# Patient Record
Sex: Female | Born: 1990 | Race: White | Hispanic: No | Marital: Single | Smoking: Never smoker
Health system: Southern US, Community
[De-identification: ages and names within clinical notes are randomized; demographics above are authoritative.]

## PROBLEM LIST (undated history)

## (undated) DIAGNOSIS — F329 Major depressive disorder, single episode, unspecified: Secondary | ICD-10-CM

## (undated) DIAGNOSIS — F32A Depression, unspecified: Secondary | ICD-10-CM

## (undated) DIAGNOSIS — N83209 Unspecified ovarian cyst, unspecified side: Secondary | ICD-10-CM

## (undated) DIAGNOSIS — G90A Postural orthostatic tachycardia syndrome (POTS): Secondary | ICD-10-CM

## (undated) DIAGNOSIS — F419 Anxiety disorder, unspecified: Secondary | ICD-10-CM

## (undated) DIAGNOSIS — I498 Other specified cardiac arrhythmias: Secondary | ICD-10-CM

## (undated) DIAGNOSIS — J45909 Unspecified asthma, uncomplicated: Secondary | ICD-10-CM

## (undated) DIAGNOSIS — R7401 Elevation of levels of liver transaminase levels: Secondary | ICD-10-CM

## (undated) DIAGNOSIS — G43909 Migraine, unspecified, not intractable, without status migrainosus: Secondary | ICD-10-CM

## (undated) DIAGNOSIS — E785 Hyperlipidemia, unspecified: Secondary | ICD-10-CM

## (undated) DIAGNOSIS — Q796 Ehlers-Danlos syndrome, unspecified: Secondary | ICD-10-CM

## (undated) DIAGNOSIS — E538 Deficiency of other specified B group vitamins: Secondary | ICD-10-CM

## (undated) DIAGNOSIS — O1410 Severe pre-eclampsia, unspecified trimester: Secondary | ICD-10-CM

## (undated) DIAGNOSIS — E559 Vitamin D deficiency, unspecified: Secondary | ICD-10-CM

## (undated) HISTORY — DX: Elevation of levels of liver transaminase levels: R74.01

## (undated) HISTORY — PX: WISDOM TOOTH EXTRACTION: SHX21

## (undated) HISTORY — DX: Vitamin D deficiency, unspecified: E55.9

## (undated) HISTORY — PX: EYE SURGERY: SHX253

## (undated) HISTORY — DX: Anxiety disorder, unspecified: F41.9

## (undated) HISTORY — DX: Migraine, unspecified, not intractable, without status migrainosus: G43.909

## (undated) HISTORY — DX: Ehlers-Danlos syndrome, unspecified: Q79.60

## (undated) HISTORY — DX: Depression, unspecified: F32.A

## (undated) HISTORY — DX: Deficiency of other specified B group vitamins: E53.8

## (undated) HISTORY — DX: Unspecified asthma, uncomplicated: J45.909

## (undated) HISTORY — DX: Hyperlipidemia, unspecified: E78.5

---
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
## 2010-08-19 NOTE — Assessment & Plan Note (Signed)
[place] OFFICE NOTE   NAME:[name], [name]                      MRN:          [id number]  DATE:06/30/2007                            DOB:          [birth year]    The patient is seen in follow-up for symptoms that were thought to be  dysautonomic.  She also has a history of hypertension, notable  hypertriglyceridemia, and headaches over the last year.  Typically, she  has short stature and appearance of long arms.   I discussed her situation with Dr. Freddy Jaksch at [facility]  forgetting about the hypertension, however though.  We discussed whether  endocrinology or genetics would be the right place to start and she  thought genetics would be.  To that end we have given the patient the  number for [facility] and have asked them to make that contact.  If  there is any difficulty she is to call us back and we will certainly go  ahead to facilitate this.  It is interesting that Dr. Marvell Fuller  is at [facility]; I remember him from when I was a resident there in the mid-  80s.   I should also note that she is on propranolol 10 mg t.i.d.  While this  is associated with some fatigue, she has been able to go to school full  time for the last couple of days for the first time in the last month or  so.   She is to let us know how she is doing and we will plan to see her in  four months' time anyway.     Deboraha Sprang, MD, [facility]  Electronically Signed    SCK/MedQ  DD: 06/30/2007  DT: 06/30/2007  Job #: [id number]   cc:   Alessandra Grout, M.D.

## 2010-08-19 NOTE — Discharge Summary (Signed)
[name], [name] [name]             ACCOUNT NO.:  [id number]   MEDICAL RECORD NO.:  [id number]          PATIENT TYPE:  OIB   LOCATION:  2853                         FACILITY:  [facility]   PHYSICIAN:  Duke Salvia, MD, FACCDATE OF BIRTH:  May 19, 1990   DATE OF ADMISSION:  06/02/2007  DATE OF DISCHARGE:  06/02/2007                               DISCHARGE SUMMARY   FINAL DIAGNOSES:  1. Fatigue, syncope, palpitations, orthostatic intolerance, dizziness.  2. Postural increase in heart rate at stress test May 10, 2007.  3. No exercise induced hypotension at stress test May 10, 2007.  4. Echocardiogram, ejection fraction  60%, no left ventricular wall      motion abnormalities.  5. Positive tilt study June 02, 2007 with postural tachycardia and      mixed cardioinhibition/vasodepression.   BRIEF HISTORY:  [name] [name] is an 20 year old female.  She has been  having dizziness which began at the end of January 2009.  She had a  dizzy spell in school.  She became diaphoretic, nauseated and then lost  consciousness as cold rags were placed on her head in the bathroom.  There was some question as to her blood pressure at the time of that  event. However, there was residual orthostatic intolerance in that when  they tried to move her from the bathroom to the principal's office, she  became extremely lightheaded and presyncopal once again.   Second episode occurred when she became syncopal working on an  elliptical machine at home.  She has had problems with recurrent tachy  palpitations when walking and going up stairs.  She has had significant  fatigue over the last couple months which has gotten worse.  Her diet is  fairly fluid compensated, but deplete of salt.  Prior workup has shown  normal thyroid, liver function studies pancreas and kidney tests.   ALLERGIES:  NO KNOWN DRUG ALLERGIES   What with the recurrent spells of tachycardia, lightheaded and dizziness  with an  epiphenomenon which suggests a neurally mediated episode,  recommendation is for a tilt study.  This will be done at the earliest  convenient time   HOSPITAL COURSE:  The patient presents electively June 02, 2007.  Her tilt study was positive with postural tachycardia, mixed  cardioinhibition, vasodepression syndrome, elements of postural  orthostatic tachycardia and orthostatic intolerance.  I am sure that the  patient is encouraged to increase salt intake.  She is discouraged from  competing in [place] this term.  She will be started on beta blocker  therapy at this time.   DISCHARGE MEDICATIONS:  1. Lovaza which is 1 gram of fish oil and 4 mg of vitamin E daily.  2. Meclizine as needed for dizziness.  3. Zomig as needed for migraines.   Currently, the patient has a very elevated triglyceride value with  triglycerides of 806, thus starting the fish oil.  She had a history of  migraines I guess as well.   New medications now are for __________ .  She is to take the first one  or unless it is ineffective and she can  cycle to the second one and then  the third one.  The first one is atenolol 50 mg daily.  The second one  is Inderal 60 mg daily.  The third one is Nadolol 20 mg daily.   FOLLOW UP:  The patient has follow up with Dr. Graciela Husbands on Thursday, June 30, 2007 at 3:30 p.m.      [name] [name], [name] [name]      Duke Salvia, MD, [facility]  Electronically Signed    GM/MEDQ  D:  06/02/2007  T:  06/03/2007  Job:  [id number]   cc:   Noralyn Pick. Eden Emms, MD, [facility]  Duke Salvia, MD, [facility]

## 2010-08-19 NOTE — Letter (Signed)
Aug 25, 2007    [name] Coma, MD  [place] [address]   RE:  [name], [name]  MRN:  [id number]  /  DOB:  [birth year]   Dear [name]:   I still look forward to putting a face to the name as you remain  singular in your having identified dysautonomia.   Just a brief followup on [facility].  [name] has been seen by the  endocrine people at [facility] trying to help understand the interactions  between her short stature, her hypertriglyceridemia.  Return evaluations  are pending part of the above.   She also saw the geneticist down at [facility] who thought she had some kind  of chondroplastic syndrome, although the issue of Turner syndrome has  been raised previously.  She will plan to follow up with the team at  [facility] to try to keep a consolidated approach.   I have told her that if as she ventriculates to the [facility] that we  will try to arrange electrophysiological followup for her postural  orthostatic tachycardia.   I should note that she has had no dizzy spells in about 2 months on the  beta blocker, salt repletion and fluid intake.  I am thrilled for her in  this regard.   We had a lengthy discussion regarding exercise and she will begin  exercising again in the A.) air conditioned environment, B.) maintaining  fluid repletion, C.) maintaining salt repletion and I  have given the name of a couple of pharmacies that may be able to  provide salt tablets as she does not like to eat them.   Thank you very much for allowing [place] to participate in her care.    Sincerely,      Deboraha Sprang, MD, [facility]  Electronically Signed    SCK/MedQ  DD: 08/25/2007  DT: 08/25/2007  Job #: [id number]

## 2010-08-19 NOTE — Procedures (Signed)
[facility]                              EXERCISE TREADMILL   NAME:[name], [name]                      MRN:          [id number]  DATE:05/10/2007                            DOB:          [birth year]    INDICATIONS:  Presyncope in a young adult.   The patient exercised for 8 minutes on a modified Bruce protocol.  First  two stages were advanced after 1 minute.  Heart rate increased to a  maximum of 178.  Blood pressure was stable at 158/70.  There was no  exercise-induced hypotension.  The patient did have an increase in her  heart rate from 95 to about 128 with initial standing.  With stress  there is no chest pain, presyncope, diaphoresis, or exercise-induced  arrhythmias.   IMPRESSION:  Negative exercise stress test at a reasonable work load  with no exercise-induced arrhythmias or hypotension.  Postural increase  in heart rate only.     Wallis Bamberg. Johnsie Cancel, MD, [facility]  Electronically Signed    PCN/MedQ  DD: 05/10/2007  DT: 05/10/2007  Job #: [id number]

## 2010-08-19 NOTE — Op Note (Signed)
[name] [name], [name] [name]             ACCOUNT NO.:  [id number]   MEDICAL RECORD NO.:  [id number]          PATIENT TYPE:  OIB   LOCATION:  2853                         FACILITY:  [facility]   PHYSICIAN:  Deboraha Sprang, MD, FACCDATE OF BIRTH:  1990-05-14   DATE OF PROCEDURE:  06/02/2007  DATE OF DISCHARGE:  06/02/2007                               OPERATIVE REPORT   PREOPERATIVE DIAGNOSIS:  Recurrent syncope.   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   The patient was submitted to a standard tilt table test.  After  equilibration in a supine position, she was tilted upright for 15  minutes.  There was a modest change in her heart rate from the mid 80s  to the mid 110s with orthostatic stress.   The patient was then given nitroglycerin 0.4 mg sublingual.  About 3  minutes after this, the patient became syncopal with heart rates  plummeting into the 50s.  The patient was returned to the supine  position with gradual regain of consciousness.  The symptoms associated  with the syncopal episode were typical of her prior syncope.   IMPRESSION:  1. Possible orthostatic tachycardia.  2. Vasodepressor and cardioinhibitory (mixed) neurally mediated      syncope.   RECOMMENDATIONS:  We will proceed with beta-blocker therapy.  I will  give her trial drugs of atenolol 25-50, nadolol 10-20, and Inderal LA 60  to see which she may tolerate.      Deboraha Sprang, MD, [facility]  Electronically Signed     SCK/MEDQ  D:  06/02/2007  T:  06/03/2007  Job:  [id number]   cc:   Lilian Coma, MD  Electrophysiology Laboratory

## 2010-08-19 NOTE — Letter (Signed)
May 30, 2007    Noralyn Pick. Eden Emms, MD, [facility]  [address]  [address]  [place],  [place]. [address]   RE:  QUINNIE, SALVAGE  MRN:  [id number]  /  DOB:  10-31-1990   Dear Cindee Lame:   It was a pleasure to see [name] [name] today at your request.  As you  know, she is a 20 year old girl who is having problems with syncope,  palpitations and some orthostatic intolerance and dizziness.   They date this back to January 28 when she had her first episode of  this. She started having problems with dizziness and she was walking  between her third and fourth period and because of ongoing dizziness she  called her mom who came to see her. When she saw her, she was noted to  be quite pale and clammy.  She was nauseated. They went to the bathroom  to put cold rags on and she lost consciousness in the bathroom. Just  prior to that the nurse had come, who been summoned by a friend.  They  laid her on the ground and they lifted up her legs and she took her  blood pressure and described as as being high according to the mom.  Indeed, later she said if he did not come down they would have to call  EMS suggesting that there was a discrepancy here between what I would  anticipate as hypotension and measured hypertension. There was residual  orthostatic intolerance in that when they tried to move her from the  bathroom to the principals office, she became extremely lightheaded and  pre-syncopal again.   She has had another episode that where she became syncopal following  getting dizzy on the elliptical at home.  She has had problems with  recurrent tachy palpitations noted with walking and going up stairs.  She has also has significant fatigue over the last couple of months that  has worsened in the last month or so.   Her diet is fairly replete of fluid, but is quite deplete of salt.   Her blood work has shown normal thyroid, liver, pancreas and kidney test  according to mom.  Interestingly, her  blood was quite lipemic and her  triglycerides were 806 and she was started on fish oil.   As you noted, her stature is short.  She is 4 feet, 8 inches tall. This  is anomalous for her family.  This has not been worked up here to date.   The patient and her mom denies significant intercurrent psychosocial  stress.   Her medications include the Lovaza, meclizine.   She has no known drug allergies.   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   On examination her blood pressure is mildly elevated at baseline today  and was 120/88.  Her orthostatics demonstrated change from sitting to  standing of 73 to 91; her diastolic pressures were in the 90s  throughout.   The stress test that she had done with Dr. Eden Emms earlier this week had  a baseline blood pressure of 91 to 96 systolic.  Her HEENT exam demonstrated no icterus or xanthoma. The neck veins were  flat.  The carotids are brisk and full bilaterally out bruits.  BACK:  Without kyphosis, scoliosis.  Lungs were clear.  HEART:  Sounds were regular without murmurs or gallops.  The abdomen is soft with active bowel sounds without midline pulsation  or hepatomegaly.  Femoral pulses were 2+; distal pulses were intact.  There is no  clubbing, cyanosis or edema.  Neurological exam was grossly normal.  The skin was warm and dry.   Her electrocardiogram was also normal with normal intervals.   Inspiratory/expiratory ratios demonstrated a quite robust vagal  innervation.   IMPRESSION:  1. Recurrent spells of tachycardia, lightheadedness and dizziness with      epiphenomenon suggestive of neurally mediated episode, but with a      recorded blood pressure during these episodes that was apparently      high and spells occurring with exertion.  2. Hypertriglyceridemia.  3. Diastolic hypertension.  4.  Short stature.   DISCUSSION:  Shryl, Fehrmann has a series of findings and I do not know  how to pull them all together.  I will take the liberty of talking to a  pediatric endocrinologist that I know down at [facility] to ask the question  about triglyceridemia and short stature and I agree it may well be that  she should be seen by a specialist as she has body proportions to me  that look a little bit unusual.  How this may relate to these spells I  am not sure it is reassuring that she had dizziness on the treadmill  that was associated with no arrhythmia, but notwithstanding given the  fact that she is having dizziness with exertion and pre-syncope and  syncope in this setting I think it is not safe for her to participate in  sports currently.   I have advised her of this and we will work as quickly as we can to try  make some headway.  I have discussed also the potential benefit of tilt  table testing and we will see how it is that that works.    Sincerely,      Duke Salvia, MD, [facility]  Electronically Signed    SCK/MedQ  DD: 05/30/2007  DT: 05/30/2007  Job #: [id number]   CC:    Emeterio Reeve, MD

## 2010-08-19 NOTE — Assessment & Plan Note (Signed)
[facility]                            CARDIOLOGY OFFICE NOTE   NAME:[name], [name]                      MRN:          [id number]  DATE:05/10/2007                            DOB:          April 30, 1990    [name] [name] is an 20 year old patient of Dr. Sandria Senter from [place]. She is referred for pre-syncope.   I actually got a call from [name] father,  [name] [name], whom I work with  doing cardiac CT.  He was concerned about his daughter. The the patient  was at school last Thursday,  she felt somewhat weak and ill. She went  to the nurse's station. Apparently, her blood pressure was high.  She  subsequently passed out.  There was no actual loss of consciousness as  far as I can tell; there was no seizure activity.  The patient has just  felt very fatigued.  She has had a bit of a busy schedule. 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   She, on occasion, will have pain in her chest.  This tends to recur with  activity. It sounds more pleuritic in nature.  There is a pressure  sensation when she takes a deep breath.   The patient has had two other bouts of syncope.  One was about 6 years  ago in the setting of significant nosebleeds and one was 2-3 years ago  [place]   She has not had any significant palpitations or history of a heart  problems.  [name] [name] has always been very short of stature. I had talked  to her mother about this and asked if during her pediatric days she had  had any workup for this she said that [name] [name] has not had any  significant endocrine nor familial problems.  She said that her  daughter's biological farther was very short in stature.   Apparently, [name] [name] saw Dr. Laurine Blazer a while back for some blood work and  has extremely high triglyceride levels.  She was tried on a Omega fatty  acids.  She apparently had repeat blood work  Friday and will have to get  these. I do not know what blood work was done, but I assume that  possibly EB virus, CBC,  thyroid were checked.   [name] [name] currently feels tired. She has not had any recurrent episodes  since Thursday.   REVIEW OF SYSTEMS:  Otherwise negative. In particular, she has not had  seizure activity.  No CNS events; no palpitations.  No previous cardiac  problems.  She was told by the nurse at school that she thought her  heart was weak for some reason, I have no idea or how a nurse would make  that assessment without any equipment, EKG or echo. Her past medical  history is remarkable for strabismus. She has had previous eye surgery  at the age of [age].  She otherwise is negative.   She denies any allergies.   She does not take any medicines  routinely.   FAMILY HISTORY:  Is negative for premature coronary disease, sudden  death and abnormal cardiomyopathies or abnormal QT syndromes.   The patient is a [name] [name] representative at [name] [name].  She is active.  She plays  Lacrosse.  Her senior project involves contemporary dance.  She does not  smoke or drink.  Her mother says she is a good child and really is not  into drugs, alcohol and not having any other problems.   The family history is noncontributory.   PHYSICAL EXAMINATION:  Is remarkable for a healthy-appearing, young  white female short of stature at 4 feet, 8 inches. Blood pressure is  126/79 and actually increases to 136/82 while standing. Interestingly,  during her stress test, her heart rate went from 95 to 130 with  standing.  HEENT:  Unremarkable.  Carotids are normal without bruit, no  lymphadenopathy, thyromegaly, JVP elevation.  LUNGS:  Clear with good  diaphragmatic motion.  No wheezing.  S1-S2 normal heart sounds.  PMI normal.  ABDOMEN:  Benign.  Bowel sounds positive.  No hepatosplenomegaly. No  hepatojugular reflux.  Distal pulses are intact, no edema.  NEURO:  Nonfocal.  SKIN:  Warm and  dry.   Baseline EKG is normal with normal PR and QT intervals.   The the patient had a 2-D echocardiogram today.  I was present for it.  It is totally normal.  She has good RV and LV function.  There is no RV  diverticula, no regional wall motion abnormalities.  No congenital  valvular heart disease and no pericardial effusion.  I performed a  treadmill test on the patient. She went 8 minutes on and advanced age  Bruce protocol.  Heart rate increased to a max of 182, blood pressure  rose normally to a max of 158/88. As indicated, she initially had about  a 30 beat elevation in heart rate from sitting to standing there was no  hypotension in the postexercise period. There was no exercise-induced  arrhythmias.   IMPRESSION:  I do not think that [name] [name] has a significant heart  problem.  She is probably run down somewhat. I would be curious as to  what her lab work showed in regards to making sure she is not anemic or  hypothyroid. I will leave it up to Dr. Laurine Blazer. However, I do think that  further endocrine workup may be in order in regards to her short  stature. I do not know if she has ever had any bone films to make sure  that there is no form of dwarfism or other congenital abnormalities in  regards to this.  1. Postural tachycardia.  She really does not have a Potts syndrome in      regards to orthostasis because her blood pressure does not fall      low.  Will have to see what her baseline lab work looks like, but      we may monitor this further as her heart rate does seem a bit high      for someone who is generally healthy.  I may have her sees Dr.      Berton Mount for further workup of this and make sure it does not      represent any other autonomic dysfunction.  2. Hypertriglyceridemia.  We will try to get lab work from Dr.      Laurine Blazer. It would be fairly unusual for this at this age to have      such  lipemic blood. Certainly omega III fatty acids would be in      order,  possibly switching to Lovaza, but I would like to see what      her general lipid profile is.   In general, with a normal EKG, normal echo and essentially normal  treadmill test, I do not think that the patient is at risk for  significant cardiac syncopal episode.   We will review her lab work and have her follow up with her general care  doctor and again I suspect I will have her see Dr. Graciela Husbands in regards to  possible autonomic dysfunction or postural tachycardia syndrome.     Noralyn Pick. Eden Emms, MD, [facility]  Electronically Signed    PCN/MedQ  DD: 05/10/2007  DT: 05/10/2007  Job #: [id number]

## 2010-12-26 LAB — HCG, SERUM, QUALITATIVE: Preg, Serum: NEGATIVE

## 2011-01-12 ENCOUNTER — Encounter: Payer: Self-pay | Admitting: Internal Medicine

## 2018-03-23 ENCOUNTER — Other Ambulatory Visit: Payer: Self-pay | Admitting: Family Medicine

## 2018-03-23 DIAGNOSIS — R7401 Elevation of levels of liver transaminase levels: Secondary | ICD-10-CM

## 2018-03-23 DIAGNOSIS — R740 Nonspecific elevation of levels of transaminase and lactic acid dehydrogenase [LDH]: Principal | ICD-10-CM

## 2018-03-24 ENCOUNTER — Ambulatory Visit
Admission: RE | Admit: 2018-03-24 | Discharge: 2018-03-24 | Disposition: A | Discharge status: Home / Self Care | Payer: Medicaid Other | Source: Ambulatory Visit | Attending: Family Medicine | Admitting: Family Medicine

## 2018-03-24 DIAGNOSIS — R740 Nonspecific elevation of levels of transaminase and lactic acid dehydrogenase [LDH]: Principal | ICD-10-CM

## 2018-03-24 DIAGNOSIS — R7401 Elevation of levels of liver transaminase levels: Secondary | ICD-10-CM

## 2018-06-20 ENCOUNTER — Encounter: Payer: Self-pay | Admitting: Endocrinology

## 2018-09-27 ENCOUNTER — Encounter: Payer: Self-pay | Admitting: Internal Medicine

## 2018-09-27 ENCOUNTER — Ambulatory Visit: Payer: Medicaid Other | Admitting: Internal Medicine

## 2018-09-27 ENCOUNTER — Other Ambulatory Visit: Payer: Self-pay

## 2018-09-27 VITALS — BP 124/78 | HR 91 | Ht <= 58 in | Wt 160.4 lb

## 2018-09-27 DIAGNOSIS — E782 Mixed hyperlipidemia: Secondary | ICD-10-CM

## 2018-09-27 DIAGNOSIS — K7581 Nonalcoholic steatohepatitis (NASH): Secondary | ICD-10-CM | POA: Diagnosis not present

## 2018-09-27 NOTE — Patient Instructions (Signed)
-   Continue Lovaza 4 grams daily  - Continue Crestor 10 mg daily   - Please discuss with your Gyn about switching your contraception , to take a trial off for 3 months

## 2018-09-27 NOTE — Progress Notes (Signed)
Name: [name] [name]  MRN/ DOB: [id number], [birth year]    Age/ Sex: 28 y.o., female    PCP: Mila Palmer, MD   Reason for Endocrinology Evaluation: Combined hyperlipidemia      Date of Initial Endocrinology Evaluation: 09/27/2018     HPI: [name] is a 28 y.o. female with a past medical history of Combined dyslipidemia, Deletion of SHOX (short stature), joint hypermobility  And NASH (sono 03/2018). The patient presented for initial endocrinology clinic visit on 09/27/2018 for consultative assistance with her combined hyperlipidemia    She was diagnosed with elevated TG at age 19 , which was in 800's range, of note the patient was on OCP's at the time for years. She was started on Lovaza at the time 4 grams daily  Which improved her TG levels to 200's. LDL increased was noted in 2016. She has tried Lipitor but made her nauseous and weak, she was then switched to another statin but currently on rosuvastatin, which she has been on for the past month.    No FH of dyslipidemia on mother side, unknown paternal history.  No FH of early death, or stoke on maternal side.    When she was younger she would drink on the weekend, now she would drink 3-5x a month.   No hx of pancreatistis  She is  Not on a strict low fat diet She is engaged and planning on getting married April/2021   HISTORY:  Past Medical History:  Past Medical History:  Diagnosis Date  . Depression   . EDS (Ehlers-Danlos syndrome)   . Hyperlipidemia   . Migraine   . Transaminitis   . Vitamin B12 deficiency   . Vitamin D deficiency    Past Surgical History:  Past Surgical History:  Procedure Laterality Date  . EYE SURGERY     x2  . WISDOM TOOTH EXTRACTION        Social History:  reports that she has never smoked. She has never used smokeless tobacco. She reports current alcohol use. She reports previous drug use.  Family History: family history includes Anxiety disorder in her mother;  Hypertension in her mother; Osteoarthritis in her paternal grandmother; Other in her father.   HOME MEDICATIONS: Allergies as of 09/27/2018   No Known Allergies     Medication List       Accurate as of September 27, 2018 11:52 AM. If you have any questions, ask your nurse or doctor.        Aimovig 70 MG/ML Soaj Generic drug: Erenumab-aooe Inject into the skin every 30 (thirty) days.   ALPRAZolam 0.5 MG tablet Commonly known as: XANAX Take 0.5 mg by mouth 3 (three) times daily as needed.   Azelastine-Fluticasone 137-50 MCG/ACT Susp Place 1 spray into the nose 2 (two) times a day.   baclofen 10 MG tablet Commonly known as: LIORESAL Take 20 mg by mouth 3 (three) times daily as needed.   Botox 200 units Solr Generic drug: Botulinum Toxin Type A Inject as directed every 3 (three) months.   Cyanocobalamin 1000 MCG/ML Kit Inject 1 mL as directed every 30 (thirty) days.   drospirenone-ethinyl estradiol 3-0.02 MG tablet Commonly known as: YAZ Take 1 tablet by mouth daily.   fluticasone 50 MCG/ACT nasal spray Commonly known as: FLONASE Place 1 spray into both nostrils daily.   hydrOXYzine 50 MG tablet Commonly known as: ATARAX/VISTARIL Take 50 mg by mouth every 8 (eight) hours as needed.   ketorolac 60 MG/2ML  Soln injection Commonly known as: TORADOL Inject 60 mg into the muscle once as needed.   meloxicam 7.5 MG tablet Commonly known as: MOBIC Take 7.5 mg by mouth daily as needed.   methylphenidate 5 MG tablet Commonly known as: RITALIN Take 5 mg by mouth daily as needed.   metroNIDAZOLE 0.75 % cream Commonly known as: METROCREAM Apply 1 application topically 2 (two) times daily.   mometasone 0.1 % cream Commonly known as: ELOCON Apply 1 application topically daily as needed.   omega-3 acid ethyl esters 1 g capsule Commonly known as: LOVAZA Take 1 g by mouth daily. Take 4 capsules daily   omega-3 acid ethyl esters 1 g capsule Commonly known as: LOVAZA Take  2 g by mouth 2 (two) times a day.   propranolol 20 MG tablet Commonly known as: INDERAL 20 mg 4 (four) times daily.   pyridostigmine 60 MG tablet Commonly known as: MESTINON Take 60 mg by mouth 3 (three) times daily.   rosuvastatin 10 MG tablet Commonly known as: CRESTOR Take 10 mg by mouth daily.   sertraline 25 MG tablet Commonly known as: ZOLOFT Take 50 mg by mouth daily.   SUMAtriptan 25 MG tablet Commonly known as: IMITREX Take 100 mg by mouth daily as needed.   zolpidem 5 MG tablet Commonly known as: AMBIEN Take 10 mg by mouth at bedtime.   Zomig 5 MG nasal solution Generic drug: zolmitriptan Place 1 spray into the nose daily as needed.         REVIEW OF SYSTEMS: A comprehensive ROS was conducted with the patient and is negative except as per HPI and below:  Review of Systems  Constitutional: Negative for fever.  HENT: Negative for congestion and sore throat.   Eyes: Negative for blurred vision and pain.  Respiratory: Negative for cough and shortness of breath.   Cardiovascular: Negative for chest pain and palpitations.  Gastrointestinal: Positive for nausea. Negative for diarrhea.  Genitourinary: Negative for frequency.  Neurological: Positive for headaches. Negative for tingling.  Endo/Heme/Allergies: Negative for polydipsia.  Psychiatric/Behavioral: Negative for depression. The patient is nervous/anxious.        OBJECTIVE:  VS: BP 124/78 (BP Location: Left Arm, Patient Position: Sitting, Cuff Size: Normal)   Pulse 91   Ht 4\' 9"  (1.448 m)   Wt 160 lb 6.4 oz (72.8 kg)   SpO2 97%   BMI 34.71 kg/m    Wt Readings from Last 3 Encounters:  09/27/18 160 lb 6.4 oz (72.8 kg)     EXAM: General: Pt appears well and is in NAD  Hydration: Well-hydrated with moist mucous membranes and good skin turgor  Eyes: External eye exam normal without stare, lid lag or exophthalmos.  EOM intact.    Ears, Nose, Throat: Hearing: Grossly intact bilaterally Dental:  Good dentition  Throat: Clear without mass, erythema or exudate  Neck: General: Supple without adenopathy. Thyroid: Thyroid size normal.  No goiter or nodules appreciated. No thyroid bruit.  Lungs: Clear with good BS bilat with no rales, rhonchi, or wheezes  Heart: Auscultation: RRR.  Abdomen: Normoactive bowel sounds, soft, nontender, without masses or organomegaly palpable  Extremities: BL LE: Trace pretibial edema normal ROM and strength.  Skin: Hair: Texture and amount normal with gender appropriate distribution Skin Inspection: No rashes. No evidence of xanthelasma or xanthomas. Skin Palpation: Skin temperature, texture, and thickness normal to palpation  Neuro: Cranial nerves: II - XII grossly intact  Motor: Normal strength throughout DTRs: 2+ and symmetric in UE  without delay in relaxation phase  Mental Status: Judgment, insight: Intact Orientation: Oriented to time, place, and person Mood and affect: No depression, anxiety, or agitation     DATA REVIEWED:  06/03/2018 Alp 47 U/L  AST 90  U/L  Alt 89 U/L T. Chol 286 mg/dL TG 564 HDL 41  LDL 332 mg/dL    9/51/88 Alt 76 (4-16) AST 67 (0-39) LDL 128 mg/dL  T.chol 606 mg/dL  TG 301  HDL 35     ASSESSMENT/PLAN/RECOMMENDATIONS:   1. Combined Hyperlipidemia :  - Pt has no stigmata of hypercholesterolemia - No Hx of premature CAD , CVA or pancreatitis - CAD is not uncommon in familial combined hyperlipidemia - She is intolerant to fenofibrates. Has been on Lovaza for years without side effects. She is intolerant to lipitor but has been tolerating Crestor well without side effects - We did discussed the effect of estrogen in raising TG in genetically predisposed individuals, and the fact that she was diagnosed with hypertriglyceridemia while on OCP's at the age of 28 , make it really difficult to ascertain her baseline without them - I have asked her to discussed with her Obgyn about non-estrogen alternative for  contraception at least for 3 months and will recheck TG at the time.  - I also advised her that statins and a lot of the lipid lowering therapies are CONTRAINDICATED during pregnancy and she will need to stop them when planning to conceive. Fenofibrates are sometimes initiated during the second trimester, there's no extensive data on lovaza but thought to be safe at this point.  - In review of her most recent labs shows dramatic improvement in her LDL with improvement in hepatic panel.  - We did discuss side effects of statins in raising LFT's, so far she is doing well.  - We also discussed the importance of low fat diet    Medications :  Continue Lovaza 4 grams daily Continue Crestor 10 mg daily    2. NASH (nonalcoholic steatohepatitis)  - Negative viral work up per records  - Evidence of nash on ultrasound 03/2018 - LFT's improving with LDL improvement  - Encouraged pt to try and do better with low fat diet as this is going to also help with NASH.   F/u in 3 months    Signed electronically by: Lyndle Herrlich, MD  [facility] End[facility]  [facility] [address] [place], [place] [address] Phone: [phone] FAX: [phone]   CC: Mila Palmer, MD [address] [place] [address] Phone: [phone] Fax: [phone]   Return to Endocrinology clinic as below: Future Appointments  Date Time Provider Department Center  12/28/2018  8:30 AM Shyquan Stallbaumer, Konrad Dolores, MD [facility] None

## 2018-12-26 ENCOUNTER — Other Ambulatory Visit: Payer: Self-pay

## 2018-12-28 ENCOUNTER — Other Ambulatory Visit: Payer: Self-pay

## 2018-12-28 ENCOUNTER — Ambulatory Visit: Payer: Medicaid Other | Admitting: Internal Medicine

## 2018-12-28 ENCOUNTER — Encounter: Payer: Self-pay | Admitting: Internal Medicine

## 2018-12-28 VITALS — BP 110/78 | HR 87 | Temp 98.7°F | Ht <= 58 in | Wt 174.4 lb

## 2018-12-28 DIAGNOSIS — K7581 Nonalcoholic steatohepatitis (NASH): Secondary | ICD-10-CM

## 2018-12-28 DIAGNOSIS — E782 Mixed hyperlipidemia: Secondary | ICD-10-CM

## 2018-12-28 LAB — COMPREHENSIVE METABOLIC PANEL
ALT: 57 U/L — ABNORMAL HIGH (ref 0–35)
AST: 43 U/L — ABNORMAL HIGH (ref 0–37)
Albumin: 4.3 g/dL (ref 3.5–5.2)
Alkaline Phosphatase: 47 U/L (ref 39–117)
BUN: 10 mg/dL (ref 6–23)
CO2: 25 mEq/L (ref 19–32)
Calcium: 9.6 mg/dL (ref 8.4–10.5)
Chloride: 103 mEq/L (ref 96–112)
Creatinine, Ser: 0.64 mg/dL (ref 0.40–1.20)
GFR: 109.17 mL/min (ref >60.00)
Glucose, Bld: 107 mg/dL — ABNORMAL HIGH (ref 70–99)
Potassium: 3.9 mEq/L (ref 3.5–5.1)
Sodium: 137 mEq/L (ref 135–145)
Total Bilirubin: 0.5 mg/dL (ref 0.2–1.2)
Total Protein: 6.9 g/dL (ref 6.0–8.3)

## 2018-12-28 LAB — LIPID PANEL
Cholesterol: 113 mg/dL (ref 0–200)
HDL: 36.6 mg/dL — ABNORMAL LOW (ref >39.00)
LDL Cholesterol: 41 mg/dL (ref 0–99)
NonHDL: 75.97
Total CHOL/HDL Ratio: 3
Triglycerides: 176 mg/dL — ABNORMAL HIGH (ref 0.0–149.0)
VLDL: 35.2 mg/dL (ref 0.0–40.0)

## 2018-12-28 NOTE — Progress Notes (Signed)
Name: [name] [name]  MRN/ DOB: [id number], Aug 22, 1990    Age/ Sex: 28 y.o., female     PCP: Mila Palmer, MD   Reason for Endocrinology Evaluation: Combined Dyslipidemia      Initial Endocrinology Clinic Visit: 09/27/18    PATIENT IDENTIFIER: [name] is a 28 y.o., female with a past medical history of Combined dyslipidemia, Deletion of SHOX (short stature), joint hypermobility  And NASH (sono 03/2018). She has followed with [facility] since 09/27/18 for consultative assistance with management of her Hyperlipidemia and NASH.   HISTORICAL SUMMARY:  She was diagnosed with elevated TG at age 16 , which was in 800's range, of note the patient was on OCP's at the time for years. She was started on Lovaza at the time 4 grams daily  Which improved her TG levels to 200's. LDL increased was noted in 2016. She has tried Lipitor but made her nauseous and weak, she was then switched to another statin but currently on rosuvastatin, which she has been on for the past month.    No FH of dyslipidemia on mother side, unknown paternal history.  No FH of early death, or stoke on maternal side.   No Hx of pancreatitis   She is engaged and planning on getting married April/2021 SUBJECTIVE:   During last visit (09/27/18): We Continued Lovaza 4 grams daily and  Crestor 10 mg daily    Today (12/28/2018):  Ms. [name] is here for a 3 month follow up on combined hyperlipidemia. She has stopped the Yaz ~ 3 months ago, has been using a diaphragm.   She denies any sob or chest pain. 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     Medications  Lovaza 4 g daily  Crestor 10 mg daily    ROS:  As per HPI.   HISTORY:  Past Medical History:   Past Medical History:  Diagnosis Date  . Depression   . EDS (Ehlers-Danlos syndrome)   . Hyperlipidemia   . Migraine   . Transaminitis   . Vitamin B12 deficiency   . Vitamin D deficiency    Past Surgical History:  Past Surgical History:  Procedure Laterality Date  . EYE SURGERY     x2  . WISDOM TOOTH EXTRACTION      Social History:  reports that she has never smoked. She has never used smokeless tobacco. She reports current alcohol use. She reports previous drug use. Family History:  Family History  Problem Relation Age of Onset  . Hypertension Mother   . Anxiety disorder Mother   . Other Father        unknown  . Osteoarthritis Paternal Grandmother      HOME MEDICATIONS: Allergies as of 12/28/2018   No Known Allergies     Medication List       Accurate as of December 28, 2018 12:53 PM. If you have any questions, ask your nurse or doctor.        STOP taking these medications   drospirenone-ethinyl estradiol 3-0.02 MG tablet Commonly known as: YAZ Stopped by: Scarlette Shorts, MD     TAKE these medications   Aimovig 70 MG/ML Soaj Generic drug: Erenumab-aooe Inject into the skin every 30 (thirty) days.   ALPRAZolam  0.5 MG tablet Commonly known as: XANAX Take 0.5 mg by mouth 3 (three) times daily as needed.   Azelastine-Fluticasone 137-50 MCG/ACT Susp Place 1 spray into the nose 2 (two) times a day.   baclofen 10 MG tablet Commonly known as: LIORESAL Take 20 mg by mouth 3 (three) times daily as needed.   Botox 200 units Solr Generic drug: Botulinum Toxin Type A Inject as directed every 3 (three) months.   Cyanocobalamin 1000 MCG/ML Kit Inject 1 mL as directed every 30 (thirty) days.   fluticasone 50 MCG/ACT nasal spray Commonly known as: FLONASE Place 1 spray into both nostrils daily.   hydrOXYzine 50 MG tablet Commonly known as: ATARAX/VISTARIL Take 50 mg by mouth every 8 (eight) hours as needed.   ketorolac 60 MG/2ML Soln injection  Commonly known as: TORADOL Inject 60 mg into the muscle once as needed.   meloxicam 7.5 MG tablet Commonly known as: MOBIC Take 7.5 mg by mouth daily as needed.   methylphenidate 5 MG tablet Commonly known as: RITALIN Take 5 mg by mouth daily as needed.   metroNIDAZOLE 0.75 % cream Commonly known as: METROCREAM Apply 1 application topically 2 (two) times daily.   mometasone 0.1 % cream Commonly known as: ELOCON Apply 1 application topically daily as needed.   omega-3 acid ethyl esters 1 g capsule Commonly known as: LOVAZA Take 1 g by mouth daily. Take 4 capsules daily   omega-3 acid ethyl esters 1 g capsule Commonly known as: LOVAZA Take 2 g by mouth 2 (two) times a day.   propranolol 20 MG tablet Commonly known as: INDERAL 20 mg 4 (four) times daily.   pyridostigmine 60 MG tablet Commonly known as: MESTINON Take 60 mg by mouth 3 (three) times daily.   rosuvastatin 10 MG tablet Commonly known as: CRESTOR Take 10 mg by mouth daily.   sertraline 25 MG tablet Commonly known as: ZOLOFT Take 50 mg by mouth daily.   SUMAtriptan 25 MG tablet Commonly known as: IMITREX Take 100 mg by mouth daily as needed.   zolpidem 5 MG tablet Commonly known as: AMBIEN Take 10 mg by mouth at bedtime.   Zomig 5 MG nasal solution Generic drug: zolmitriptan Place 1 spray into the nose daily as needed.         OBJECTIVE:   PHYSICAL EXAM: VS: BP 110/78 (BP Location: Left Arm, Patient Position: Sitting, Cuff Size: Large)   Pulse 87   Temp 98.7 F (37.1 C)   Ht 4\' 9"  (1.448 m)   Wt 174 lb 6.4 oz (79.1 kg)   SpO2 98%   BMI 37.74 kg/m    EXAM: General: Pt appears well and is in NAD  Hydration: Well-hydrated with moist mucous membranes and good skin turgor  Eyes: External eye exam normal without stare, lid lag or exophthalmos.  EOM intact.  PERRL.  Ears, Nose, Throat: Hearing: Grossly intact bilaterally Dental: Good dentition  Throat: Clear without mass, erythema or  exudate  Neck: General: Supple without adenopathy. Thyroid: Thyroid size normal.  No goiter or nodules appreciated. No thyroid bruit.  Lungs: Clear with good BS bilat with no rales, rhonchi, or wheezes  Heart: Auscultation: RRR.  Abdomen: Normoactive bowel sounds, soft, nontender, without masses or organomegaly palpable  Extremities: Gait and station: Normal gait  Digits and nails: No clubbing, cyanosis, petechiae, or nodes Head and neck: Normal alignment and mobility BL UE: Normal ROM and strength. BL LE: No pretibial edema normal ROM and strength.  Skin: Hair:  Texture and amount normal with gender appropriate distribution Skin Inspection: No rashes, acanthosis nigricans/skin tags. No lipohypertrophy Skin Palpation: Skin temperature, texture, and thickness normal to palpation  Neuro: Cranial nerves: II - XII grossly intact  Cerebellar: Normal coordination and movement; no tremor Motor: Normal strength throughout DTRs: 2+ and symmetric in UE without delay in relaxation phase  Mental Status: Judgment, insight: Intact Orientation: Oriented to time, place, and person Memory: Intact for recent and remote events Mood and affect: No depression, anxiety, or agitation     DATA REVIEWED: Results for BRITTINY, NESTEL (MRN [id number]) as of 12/28/2018 13:48  Ref. Range 12/28/2018 09:08  Sodium Latest Ref Range: 135 - 145 mEq/L 137  Potassium Latest Ref Range: 3.5 - 5.1 mEq/L 3.9  Chloride Latest Ref Range: 96 - 112 mEq/L 103  CO2 Latest Ref Range: 19 - 32 mEq/L 25  Glucose Latest Ref Range: 70 - 99 mg/dL 782 (H)  BUN Latest Ref Range: 6 - 23 mg/dL 10  Creatinine Latest Ref Range: 0.40 - 1.20 mg/dL 9.56  Calcium Latest Ref Range: 8.4 - 10.5 mg/dL 9.6  Alkaline Phosphatase Latest Ref Range: 39 - 117 U/L 47  Albumin Latest Ref Range: 3.5 - 5.2 g/dL 4.3  AST Latest Ref Range: 0 - 37 U/L 43 (H)  ALT Latest Ref Range: 0 - 35 U/L 57 (H)  Total Protein Latest Ref Range: 6.0 - 8.3 g/dL 6.9  Total  Bilirubin Latest Ref Range: 0.2 - 1.2 mg/dL 0.5  GFR Latest Ref Range: >60.00 mL/min 109.17  Total CHOL/HDL Ratio Unknown 3  Cholesterol Latest Ref Range: 0 - 200 mg/dL 213  HDL Cholesterol Latest Ref Range: >39.00 mg/dL 08.65 (L)  LDL (calc) Latest Ref Range: 0 - 99 mg/dL 41  NonHDL Unknown 78.46  Triglycerides Latest Ref Range: 0.0 - 149.0 mg/dL 962.9 (H)  VLDL Latest Ref Range: 0.0 - 40.0 mg/dL 52.8    07/18/2438 Alp 47 U/L  AST 90  U/L  Alt 89 U/L T. Chol 286 mg/dL TG 102 HDL 41  LDL 725 mg/dL    3/66/44 Alt 76 (0-34) AST 67 (0-39) LDL 128 mg/dL  T.chol 742 mg/dL  TG 595  HDL 35      ASSESSMENT / PLAN / RECOMMENDATIONS:   1. Combined Hyperlipidemia :  - Pt has no stigmata of hypercholesterolemia - No Hx of premature CAD , CVA or pancreatitis - She is intolerant to fenofibrates. Has been on Lovaza for years without side effects. She is intolerant to lipitor but has been tolerating Crestor well without side effects - She has been off OCP's for ~ 3 months with dramatic improvement in her TG levels. Pt should stay away from any estrogen containing contraception, she may use IUD or progesterone only formulations. - She understands that lipid lowering therapies are CONTRAINDICATED during pregnancy and she will need to stop them when planning to conceive. Fenofibrates are sometimes initiated during the second trimester, there's no extensive data on lovaza but thought to be safe at this point.  - We again emphasized the importance of low fat diet , will refer to our CDE - Encouraged compliance   Medications :  Continue Lovaza 4 grams daily Continue Crestor 10 mg daily     2. NASH (nonalcoholic steatohepatitis)  - Negative viral work up per records  - Evidence of nash on ultrasound 03/2018 - LFT's continue to improve    F/U in 6 months    Signed electronically by: Lyndle Herrlich, MD  [place]  [facility] [address] [place], [place] [address] Phone: [phone] FAX: [phone]      CC: Mila Palmer, MD [address] [place] [address] Phone: [phone]  Fax: [phone]   Return to Endocrinology clinic as below: Future Appointments  Date Time Provider Department Center  06/26/2019  9:30 AM Elianna Windom, Konrad Dolores, MD [facility] None

## 2019-01-06 ENCOUNTER — Other Ambulatory Visit: Payer: Self-pay

## 2019-01-06 DIAGNOSIS — Z20822 Contact with and (suspected) exposure to covid-19: Secondary | ICD-10-CM

## 2019-01-07 LAB — NOVEL CORONAVIRUS, NAA: SARS-CoV-2, NAA: NOT DETECTED

## 2019-01-26 ENCOUNTER — Encounter: Payer: Self-pay | Admitting: Dietician

## 2019-01-26 ENCOUNTER — Other Ambulatory Visit: Payer: Self-pay

## 2019-01-26 ENCOUNTER — Encounter: Payer: Medicaid Other | Attending: Internal Medicine | Admitting: Dietician

## 2019-01-26 DIAGNOSIS — E782 Mixed hyperlipidemia: Secondary | ICD-10-CM

## 2019-01-26 NOTE — Progress Notes (Signed)
Medical Nutrition Therapy:  Appt start time: 1430 end time:  0034.   Assessment:  Primary concerns today: Patient is here today alone with a referral for mixed HLD.  She states that her triglycerides where 806 when she was 17.  She stopped YAZ (on this since age 28) and started rosuvastatin and states that she feels better (improved migraines) and noted significant improvement in her labwork.  History includes Combined dyslipidemia, Deletion of SHOX (short stature), POTS syndrome, joint hypermobility, vitamin D deficiency, vitamin B-12 deficiency and NASH (sono 03/2018). She has followed with [facility] since 09/27/18 for consultative assistance with management of her Hyperlipidemia and NASH. Migraines.  She has not been sleeping well lately.  Weight hx: 178 lbs today Gained from her UBW of 150 lbs since quarantine.  She states that she is not as active, her boyfriend is there often and is negatively influenced by available food and food suggestions and is eating late at night.  States that when she was young, she had a very unhealthy relationship with food.  Did not like to eat because this caused her to gain weight and weighed herself daily.  Until recently she did not have a scale.   Lives with her fiance.  She does the shopping and cooking.   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  She has done other volunteer work.  She is on disability due to POTS syndrome and migraines.  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   LABS: 06/03/2018 Alp 47 U/L  AST 90 U/L  Alt 89 U/L T. Chol 286 mg/dL TG 410 HDL 41  LDL 193 mg/dL   09/20/18 Alt 76 (0-52) AST 67 (0-39) LDL 128 mg/dL  T.chol 232 mg/dL  TG 414  HDL 35  12/28/2018 Cholesterol 113 HDL 36 LDL 41 TG 176 AST 43 ALT 57  Preferred Learning Style:   No preference indicated   Learning Readiness:   Ready  MEDICATIONS: see list to include omega 3, rosuvastatin, and MVI   DIETARY INTAKE: Avoided  foods include beef (doesn't digest well).  Soda, sugary drinks.  Sensitive to dairy at times  24-hr recall:  B (11AM): skips recently due to not sleeping well OR frosted mini wheats and 2% milk OR plain nonfat greek yogurt, granola OR muffin from grocery store Snk ( AM): none  L (3-4 PM): left overs OR kale salad Snk ( PM): fresh fruit (apples or banana) D (8-9 PM): tortellini, ravioli, or other pasta with pesto, marinara, or alfredo OR baked chicken, rice or pasta, vegetables OR tacos with ground [place], lettuce, low fat cheese Snk ( PM): fruit gummies OR halloween candy (skiittles or tootsie roll) OR 2 scoops ice cream OR cookies Beverages: water, Gatorade zero, propel, liquid IV, occasional smoothie (kale or spinach, flax or chia seeds, ice, frozen fruit, almond milk, Pomegranate juice, occasional greek yogurt), 1-2 glasses wine every other week  Usual physical activity: walks dogs 10-20 minutes once daily  Progress Towards Goal(s):  In progress.   Nutritional Diagnosis:  NB-1.1 Food and nutrition-related knowledge deficit As related to nutrition and HLD.  As evidenced by diet hx and patient report.    Intervention:  Nutrition education/counseling related to behavior change and mindfulness with goal to improve labs and health.  Discussed benefits of increased whole grains, vegetables, fresh fruit, lean protein including vegetarian options.  Discussed importance of taking time for self care and importance of exercise.  Discussed portion sizes and showed food models.  PLAN: Mindfulness:  Continue to eat slowly  Consider your choices and portion sizes  Are you hungry or just eating because you are bored etc.  When are you satisfied or full?  Eat away from distraction (no TV, computer, etc.) and avoid eating in bed  What is my barrier to self care?  Listen to your body related to exercise. When you are feeling like you can be active, walk, find exercise video's on line, recumbent   bike etc.  Aim for routine/consistency as able.  Consider Recommendations: Less saturated fat Less sugar   Teaching Method Utilized:  Visual Auditory Hands on  Handouts given during visit include:  TLC heart healthy nutrition therapy from [facility]  Cholesterol and Triglycerides  Label reading  Barriers to learning/adherence to lifestyle change: none  Demonstrated degree of understanding via:  Teach Back   Monitoring/Evaluation:  Dietary intake, exercise, label reading, and body weight prn.

## 2019-01-26 NOTE — Patient Instructions (Addendum)
Mindfulness:  Continue to eat slowly  Consider your choices and portion sizes  Are you hungry or just eating because you are bored etc.  When are you satisfied or full?  Eat away from distraction (no TV, computer, etc.) and avoid eating in bed  What is my barrier to self care?  Listen to your body related to exercise. When you are feeling like you can be active, walk, find exercise video's on line, recumbent  bike etc.  Aim for routine/consistency as able.  Consider Recommendations: Less saturated fat Less sugar

## 2019-03-02 IMAGING — US US ABDOMEN LIMITED
1 series · 14 of 25 positions shown · non-contrast
Comparison: None.

CLINICAL DATA: Elevated liver function tests.

EXAM:
ULTRASOUND ABDOMEN LIMITED RIGHT UPPER QUADRANT

[Series 1: us abdomen limited · 0.22mm/px · 14 of 45 slices shown]
[im 1/45]
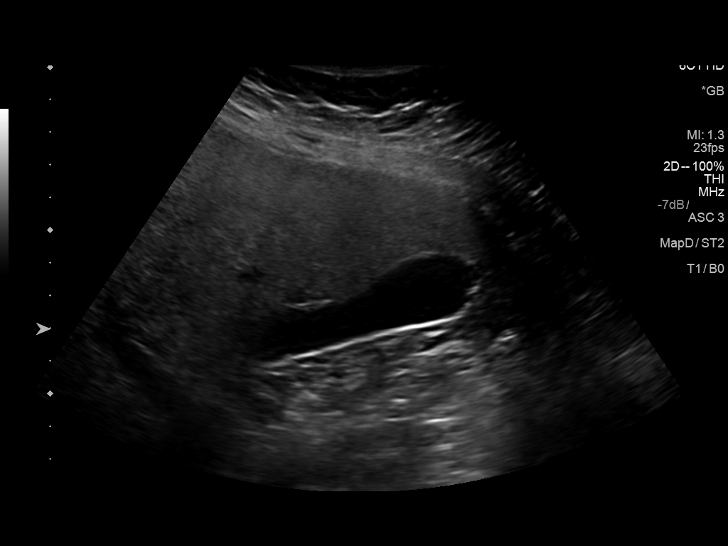
[im 4/45]
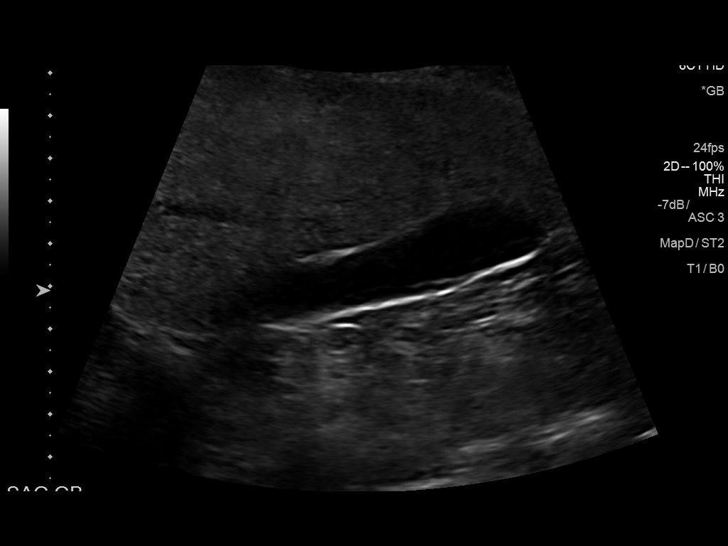
[im 8/45]
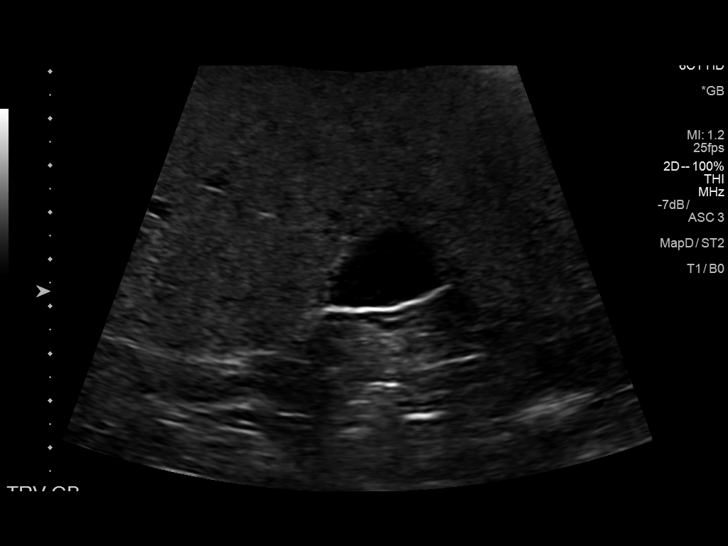
[im 12/45]
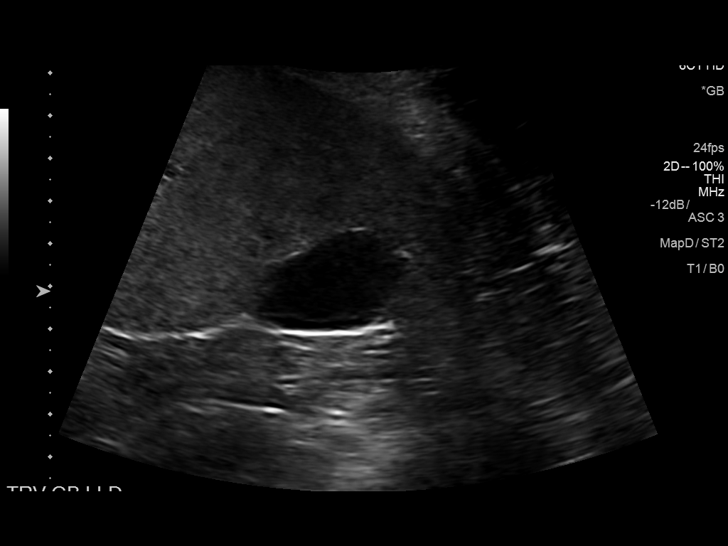
[im 15/45]
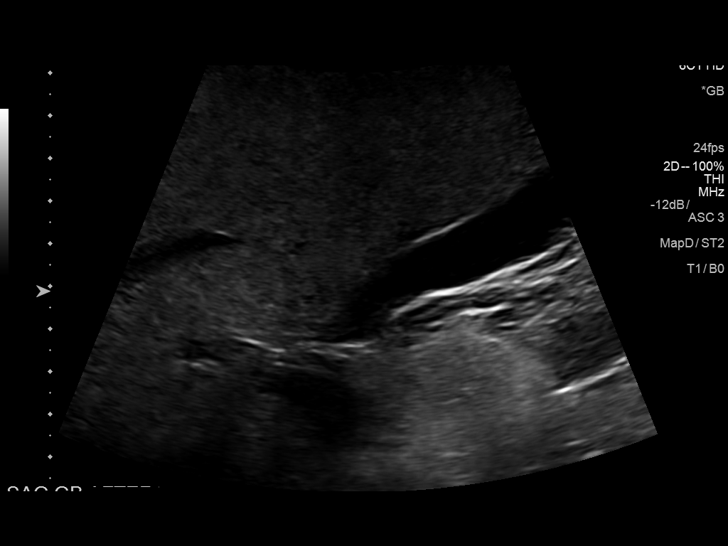
[im 17/45]
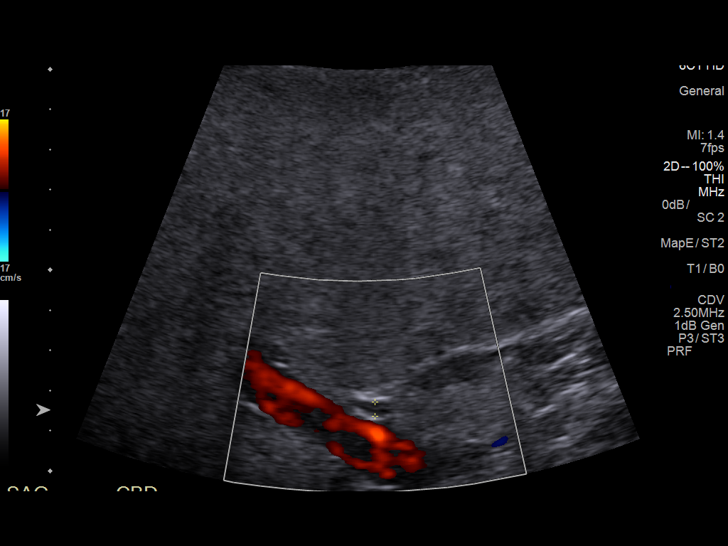
[im 21/45]
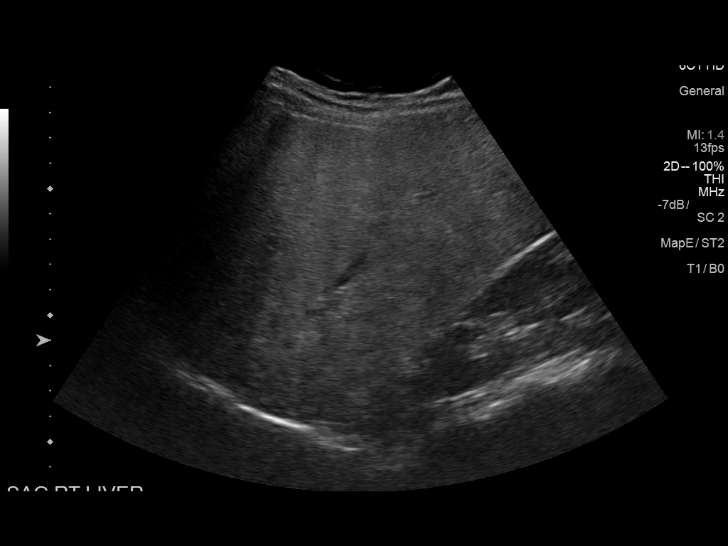
[im 24/45]
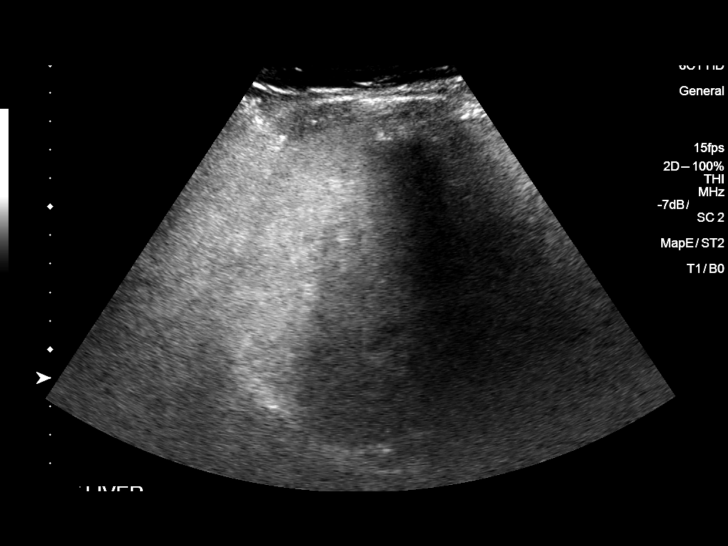
[im 28/45]
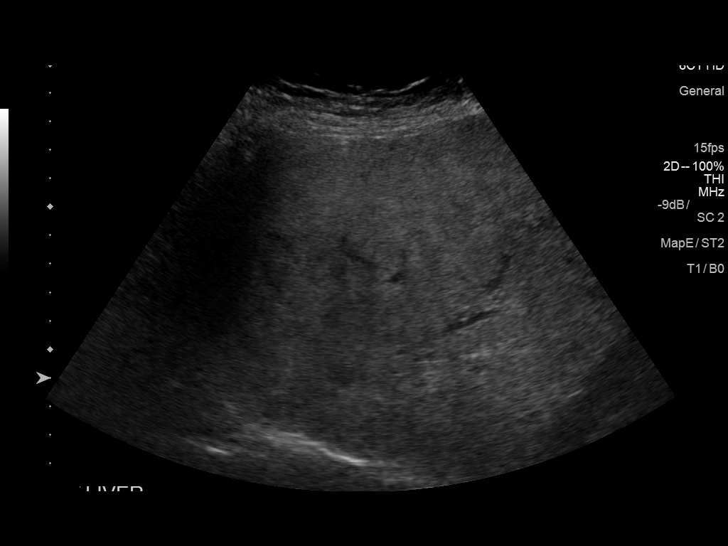
[im 30/45]
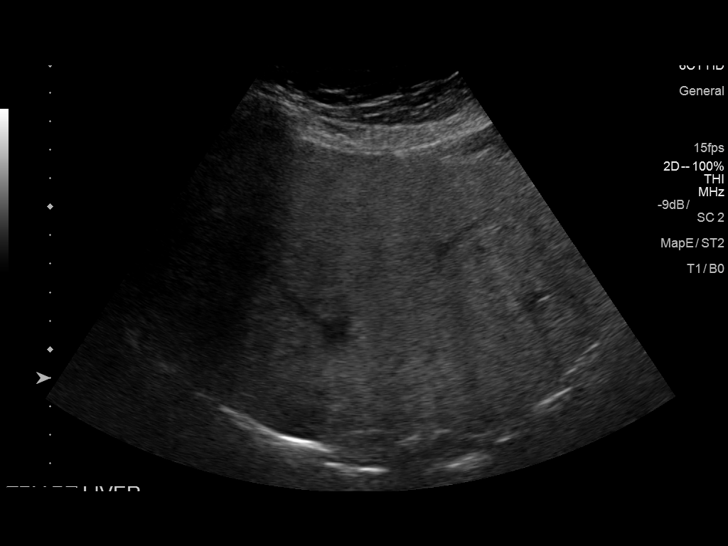
[im 34/45]
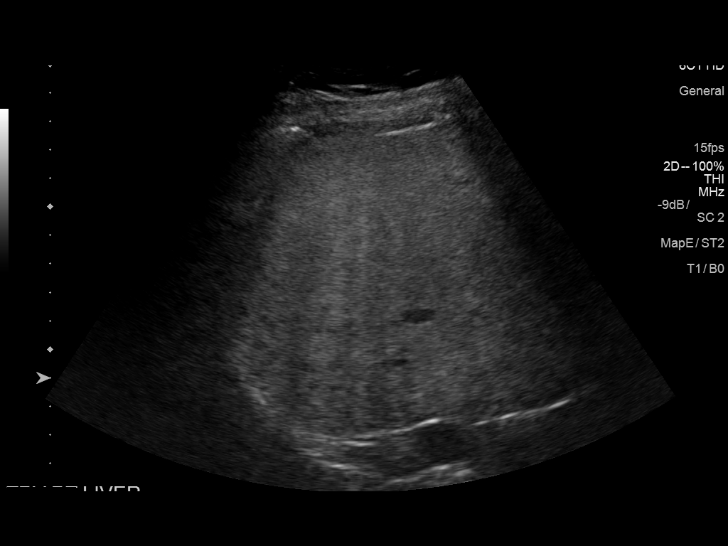
[im 37/45]
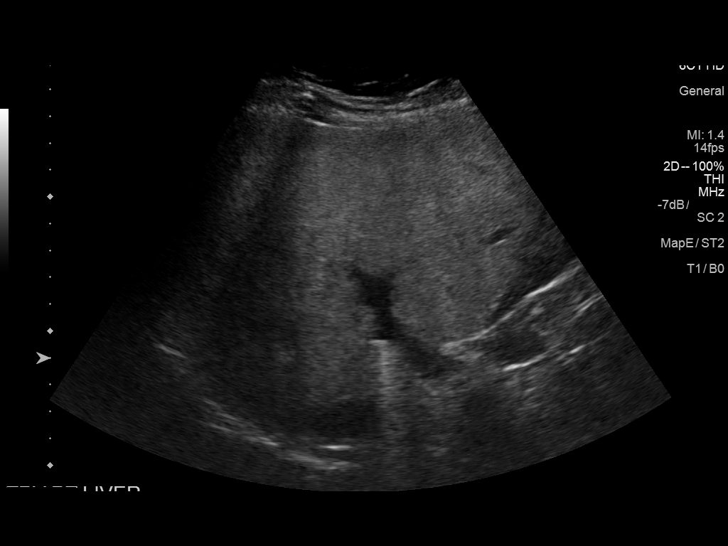
[im 41/45]
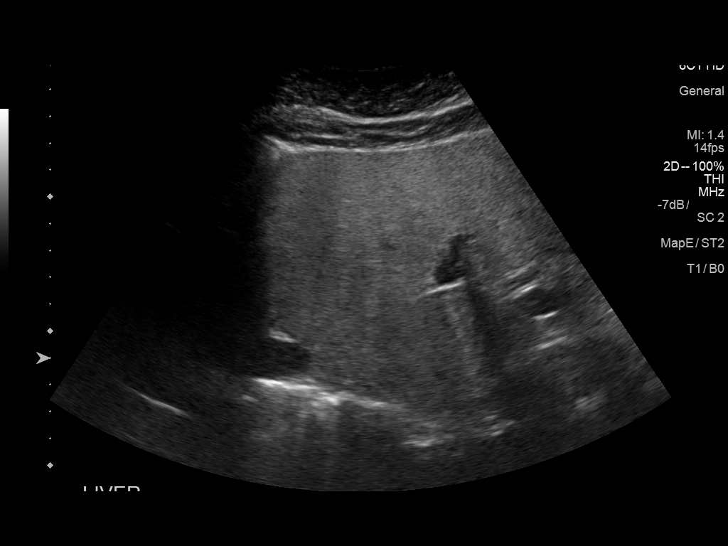
[im 45/45]
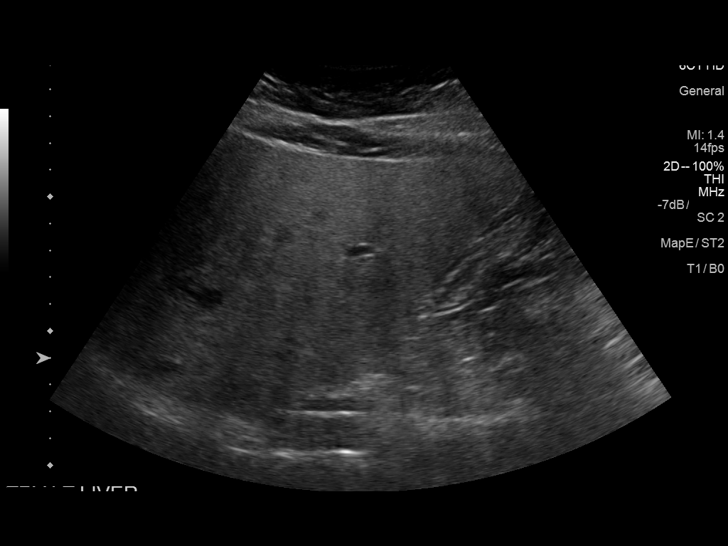

[14 of 25 positions shown; findings below may reference images not displayed]

FINDINGS: Gallbladder:

No gallstones or wall thickening visualized. No sonographic Murphy
sign noted by sonographer.

Common bile duct:

Diameter: 3.5 mm which is within normal limits.

Liver:

No focal lesion identified. Increased echogenicity of hepatic
parenchyma is noted suggesting fatty infiltration or other diffuse
hepatocellular disease. Portal vein is patent on color Doppler
imaging with normal direction of blood flow towards the liver.
IMPRESSION: Increased echogenicity of hepatic parenchyma suggesting fatty
infiltration or other diffuse hepatocellular disease. No other
abnormality seen in the right upper quadrant of the abdomen.

## 2019-06-22 ENCOUNTER — Other Ambulatory Visit: Payer: Self-pay

## 2019-06-26 ENCOUNTER — Ambulatory Visit: Payer: Medicaid Other | Admitting: Internal Medicine

## 2019-06-26 ENCOUNTER — Other Ambulatory Visit: Payer: Self-pay

## 2019-06-26 ENCOUNTER — Encounter: Payer: Self-pay | Admitting: Internal Medicine

## 2019-06-26 VITALS — BP 114/68 | HR 91 | Temp 98.7°F | Ht <= 58 in | Wt 186.2 lb

## 2019-06-26 DIAGNOSIS — K7581 Nonalcoholic steatohepatitis (NASH): Secondary | ICD-10-CM | POA: Diagnosis not present

## 2019-06-26 DIAGNOSIS — E782 Mixed hyperlipidemia: Secondary | ICD-10-CM

## 2019-06-26 LAB — COMPREHENSIVE METABOLIC PANEL
ALT: 35 U/L (ref 0–35)
AST: 23 U/L (ref 0–37)
Albumin: 4.3 g/dL (ref 3.5–5.2)
Alkaline Phosphatase: 55 U/L (ref 39–117)
BUN: 13 mg/dL (ref 6–23)
CO2: 24 mEq/L (ref 19–32)
Calcium: 9.2 mg/dL (ref 8.4–10.5)
Chloride: 105 mEq/L (ref 96–112)
Creatinine, Ser: 0.63 mg/dL (ref 0.40–1.20)
GFR: 110.8 mL/min (ref >60.00)
Glucose, Bld: 112 mg/dL — ABNORMAL HIGH (ref 70–99)
Potassium: 4.2 mEq/L (ref 3.5–5.1)
Sodium: 136 mEq/L (ref 135–145)
Total Bilirubin: 0.5 mg/dL (ref 0.2–1.2)
Total Protein: 7.1 g/dL (ref 6.0–8.3)

## 2019-06-26 LAB — LIPID PANEL
Cholesterol: 123 mg/dL (ref 0–200)
HDL: 37.3 mg/dL — ABNORMAL LOW (ref >39.00)
LDL Cholesterol: 61 mg/dL (ref 0–99)
NonHDL: 85.65
Total CHOL/HDL Ratio: 3
Triglycerides: 124 mg/dL (ref 0.0–149.0)
VLDL: 24.8 mg/dL (ref 0.0–40.0)

## 2019-06-26 NOTE — Patient Instructions (Signed)
-   Continue Lovaza 4 grams daily  - Continue Crestor 10 mg daily

## 2019-06-26 NOTE — Progress Notes (Signed)
Name: [name] [name]  MRN/ DOB: [id number], [birth year]    Age/ Sex: 29 y.o., female     PCP: Mila Palmer, MD   Reason for Endocrinology Evaluation: Combined Dyslipidemia      Initial Endocrinology Clinic Visit: 09/27/18    PATIENT IDENTIFIER: [name] [name] is a 29 y.o., female with a past medical history of Combined dyslipidemia, Deletion of SHOX (short stature), joint hypermobility  And NASH (sono 03/2018). She has followed with [facility] since 09/27/18 for consultative assistance with management of her Hyperlipidemia and NASH.   HISTORICAL SUMMARY:  She was diagnosed with elevated TG at age 71 , which was in 800's range, of note the patient was on OCP's at the time for years. She was started on Lovaza at the time 4 grams daily  Which improved her TG levels to 200's. LDL increased was noted in 2016. She has tried Lipitor but made her nauseous and weak, she was then switched to another statin but on her initial visit to our clinic she was on rosuvastatin.   No FH of dyslipidemia on mother side, unknown paternal history.  No FH of early death, or stoke on maternal side.   No Hx of pancreatitis   Her triglycerides improved tremendously to 176 mg/DL  with stopping the combined oral contraceptive pills.  She is engaged  SUBJECTIVE:   During last visit (12/28/18): TG 176 mg/dL, We Continued Lovaza 4 grams daily and  Crestor 10 mg daily    Today (06/26/2019):  [name] [name] is here for a 6 month follow up on combined hyperlipidemia.  She denies any sob or chest pain.  She also has peripheral neuropathy with burning and pain in her feet She tries to be on a low-fat diet, but she likes sweets.    Medications  Lovaza 4 g daily  Crestor 10 mg daily    ROS:  As per HPI.   HISTORY:  Past Medical History:  Past Medical History:  Diagnosis Date  . Depression   . EDS (Ehlers-Danlos syndrome)   . Hyperlipidemia   . Migraine   . Transaminitis   .  Vitamin B12 deficiency   . Vitamin D deficiency    Past Surgical History:  Past Surgical History:  Procedure Laterality Date  . EYE SURGERY     x2  . WISDOM TOOTH EXTRACTION      Social History:  reports that she has never smoked. She has never used smokeless tobacco. She reports current alcohol use. She reports previous drug use. Family History:  Family History  Problem Relation Age of Onset  . Hypertension Mother   . Anxiety disorder Mother   . Other Father        unknown  . Osteoarthritis Paternal Grandmother      HOME MEDICATIONS: Allergies as of 06/26/2019   No Known Allergies     Medication List       Accurate as of June 26, 2019 12:24 PM. If you have any questions, ask your nurse or doctor.        Aimovig 70 MG/ML Soaj Generic drug: Erenumab-aooe Inject into the skin every 30 (thirty) days.   ALPRAZolam 0.5 MG tablet Commonly known as: XANAX Take 0.5 mg by mouth 3 (three) times daily as needed.   Azelastine-Fluticasone 137-50 MCG/ACT Susp Place 1 spray into the nose 2 (two) times a day.   baclofen 10 MG tablet Commonly known as: LIORESAL Take 20 mg by mouth 3 (three) times daily as needed.  Botox 200 units Solr Generic drug: Botulinum Toxin Type A Inject as directed every 3 (three) months.   cetirizine 10 MG tablet Commonly known as: ZYRTEC Take 10 mg by mouth daily.   Cyanocobalamin 1000 MCG/ML Kit Inject 1 mL as directed every 30 (thirty) days.   fluticasone 50 MCG/ACT nasal spray Commonly known as: FLONASE Place 1 spray into both nostrils daily.   hydrOXYzine 50 MG tablet Commonly known as: ATARAX/VISTARIL Take 50 mg by mouth every 8 (eight) hours as needed.   ketorolac 60 MG/2ML Soln injection Commonly known as: TORADOL Inject 60 mg into the muscle once as needed.   Melatonin 1 MG Caps Take by mouth.   meloxicam 7.5 MG tablet Commonly known as: MOBIC Take 7.5 mg by mouth daily as needed.   methylphenidate 5 MG  tablet Commonly known as: RITALIN Take 5 mg by mouth daily as needed.   metroNIDAZOLE 0.75 % cream Commonly known as: METROCREAM Apply 1 application topically 2 (two) times daily.   mometasone 0.1 % cream Commonly known as: ELOCON Apply 1 application topically daily as needed.   montelukast 4 MG Pack Commonly known as: SINGULAIR Take 4 mg by mouth daily as needed.   omega-3 acid ethyl esters 1 g capsule Commonly known as: LOVAZA Take 1 g by mouth daily. Take 4 capsules daily   omega-3 acid ethyl esters 1 g capsule Commonly known as: LOVAZA Take 2 g by mouth 2 (two) times a day.   ONE-A-DAY WOMENS 50 PLUS PO Take by mouth.   PROBIOTIC ADVANCED PO Take by mouth.   propranolol 20 MG tablet Commonly known as: INDERAL 20 mg 4 (four) times daily.   pyridostigmine 60 MG tablet Commonly known as: MESTINON Take 60 mg by mouth 3 (three) times daily.   rosuvastatin 10 MG tablet Commonly known as: CRESTOR Take 10 mg by mouth daily.   sertraline 25 MG tablet Commonly known as: ZOLOFT Take 50 mg by mouth daily.   SUMAtriptan 25 MG tablet Commonly known as: IMITREX Take 100 mg by mouth daily as needed.   tamsulosin 0.4 MG Caps capsule Commonly known as: FLOMAX Take 0.4 mg by mouth.   zolpidem 5 MG tablet Commonly known as: AMBIEN Take 10 mg by mouth at bedtime.   Zomig 5 MG nasal solution Generic drug: zolmitriptan Place 1 spray into the nose daily as needed.         OBJECTIVE:   PHYSICAL EXAM: VS: BP 114/68 (BP Location: Left Arm, Patient Position: Sitting, Cuff Size: Large)   Pulse 91   Temp 98.7 F (37.1 C)   Ht 4\' 9"  (1.448 m)   Wt 186 lb 3.2 oz (84.5 kg)   SpO2 99%   BMI 40.29 kg/m    EXAM: General: Pt appears well and is in NAD  Lungs: Clear with good BS bilat with no rales, rhonchi, or wheezes  Heart: Auscultation: RRR.  Abdomen: Normoactive bowel sounds, soft, nontender, without masses or organomegaly palpable  Extremities:  BL LE: No  pretibial edema normal ROM and strength.  Mental Status: Judgment, insight: Intact Mood and affect: No depression, anxiety, or agitation     DATA REVIEWED:   06/03/2018 Alp 47 U/L  AST 90  U/L  Alt 89 U/L T. Chol 286 mg/dL TG 725 HDL 41  LDL 366 mg/dL    4/40/34 Alt 76 (7-42) AST 67 (0-39) LDL 128 mg/dL  T.chol 595 mg/dL  TG 638  HDL 35    Results for [name] [name], [name] [name] (  MRN [id number]) as of 06/26/2019 16:32  Ref. Range 06/26/2019 10:10  Sodium Latest Ref Range: 135 - 145 mEq/L 136  Potassium Latest Ref Range: 3.5 - 5.1 mEq/L 4.2  Chloride Latest Ref Range: 96 - 112 mEq/L 105  CO2 Latest Ref Range: 19 - 32 mEq/L 24  Glucose Latest Ref Range: 70 - 99 mg/dL 403 (H)  BUN Latest Ref Range: 6 - 23 mg/dL 13  Creatinine Latest Ref Range: 0.40 - 1.20 mg/dL 4.74  Calcium Latest Ref Range: 8.4 - 10.5 mg/dL 9.2  Alkaline Phosphatase Latest Ref Range: 39 - 117 U/L 55  Albumin Latest Ref Range: 3.5 - 5.2 g/dL 4.3  AST Latest Ref Range: 0 - 37 U/L 23  ALT Latest Ref Range: 0 - 35 U/L 35  Total Protein Latest Ref Range: 6.0 - 8.3 g/dL 7.1  Total Bilirubin Latest Ref Range: 0.2 - 1.2 mg/dL 0.5  GFR Latest Ref Range: >60.00 mL/min 110.80  Total CHOL/HDL Ratio Unknown 3  Cholesterol Latest Ref Range: 0 - 200 mg/dL 259  HDL Cholesterol Latest Ref Range: >39.00 mg/dL 56.38 (L)  LDL (calc) Latest Ref Range: 0 - 99 mg/dL 61  NonHDL Unknown 75.64  Triglycerides Latest Ref Range: 0.0 - 149.0 mg/dL 332.9  VLDL Latest Ref Range: 0.0 - 40.0 mg/dL 51.8    ASSESSMENT / PLAN / RECOMMENDATIONS:   1. Combined Hyperlipidemia :  - Pt has no stigmata of hypercholesterolemia - No Hx of premature CAD , CVA or pancreatitis - She is intolerant to fenofibrates. Has been on Lovaza for years without side effects. She is intolerant to lipitor but has been tolerating Crestor well without side effects -Her triglycerides improved tremendously once she came off the combined oral contraceptive pills. -  She understands that lipid lowering therapies are CONTRAINDICATED during pregnancy and she will need to stop them when planning to conceive. Fenofibrates are sometimes initiated during the second trimester, there's no extensive data on lovaza but thought to be safe at this point.    Medications :  Continue Lovaza 4 grams daily Continue Crestor 10 mg daily     2. NASH (nonalcoholic steatohepatitis)  - Negative viral work up per records  - Evidence of nash on ultrasound 03/2018 - LFT's have normalized on today's labs    F/U in 6 months    Signed electronically by: Lyndle Herrlich, MD  [facility]  [facility] [address] [place], [place] [address] Phone: [phone] FAX: [phone]      CC: Mila Palmer, MD [address] [place] [address] Phone: [phone]  Fax: [phone]   Return to Endocrinology clinic as below: Future Appointments  Date Time Provider Department Center  12/28/2019  9:30 AM Luverta Korte, Konrad Dolores, MD [facility] None

## 2019-08-25 ENCOUNTER — Encounter: Payer: Self-pay | Admitting: Internal Medicine

## 2019-08-29 NOTE — Progress Notes (Signed)
Name: [name] [name]  MRN/ DOB: [id number], November 29, 1990    Age/ Sex: 29 y.o., female     PCP: Mila Palmer, MD   Reason for Endocrinology Evaluation: Combined Dyslipidemia      Initial Endocrinology Clinic Visit: 09/27/18    PATIENT IDENTIFIER: [name] [name] is a 29 y.o., female with a past medical history of Combined dyslipidemia, Deletion of SHOX (short stature), joint hypermobility  And NASH (sono 03/2018). She has followed with [facility] since 09/27/18 for consultative assistance with management of her Hyperlipidemia and NASH.   HISTORICAL SUMMARY:  She was diagnosed with elevated TG at age 24 , which was in 800's range, of note the patient was on OCP's at the time for years. She was started on Lovaza at the time 4 grams daily  Which improved her TG levels to 200's. LDL increased was noted in 2016. She has tried Lipitor but made her nauseous and weak, she was then switched to another statin but on her initial visit to our clinic she was on rosuvastatin.   No FH of dyslipidemia on mother side, unknown paternal history.  No FH of early death, or stoke on maternal side.   No Hx of pancreatitis   Her triglycerides improved tremendously to 176 mg/DL  with stopping the combined oral contraceptive pills.  She is engaged  SUBJECTIVE:   During last visit (12/28/18): TG 176 mg/dL, We Continued Lovaza 4 grams daily and  Crestor 10 mg daily    Today (08/31/2019):  Ms. Banford is here for a follow up on combined hyperlipidemia.  She denies any sob or chest pain.  She also has peripheral neuropathy with burning and pain in her feet She tries to be on a low-fat diet  She is currently 14 weeks of gestation   She had nausea initially associated with fatigue but this has resolved Has noted worsening migraines   EDD 11/23/ 2021   ROS:  As per HPI.   HISTORY:  Past Medical History:  Past Medical History:  Diagnosis Date  . Depression   . EDS  (Ehlers-Danlos syndrome)   . Hyperlipidemia   . Migraine   . Transaminitis   . Vitamin B12 deficiency   . Vitamin D deficiency    Past Surgical History:  Past Surgical History:  Procedure Laterality Date  . EYE SURGERY     x2  . WISDOM TOOTH EXTRACTION      Social History:  reports that she has never smoked. She has never used smokeless tobacco. She reports current alcohol use. She reports previous drug use. Family History:  Family History  Problem Relation Age of Onset  . Hypertension Mother   . Anxiety disorder Mother   . Other Father        unknown  . Osteoarthritis Paternal Grandmother      HOME MEDICATIONS: Allergies as of 08/30/2019   No Known Allergies     Medication List       Accurate as of Aug 30, 2019 11:59 PM. If you have any questions, ask your nurse or doctor.        Aimovig 70 MG/ML Soaj Generic drug: Erenumab-aooe Inject into the skin every 30 (thirty) days.   ALPRAZolam 0.5 MG tablet Commonly known as: XANAX Take 0.5 mg by mouth 3 (three) times daily as needed.   Azelastine-Fluticasone 137-50 MCG/ACT Susp Place 1 spray into the nose 2 (two) times a day.   baclofen 10 MG tablet Commonly known as: LIORESAL Take 20  mg by mouth 3 (three) times daily as needed.   Botox 200 units Solr Generic drug: Botulinum Toxin Type A Inject as directed every 3 (three) months.   cetirizine 10 MG tablet Commonly known as: ZYRTEC Take 10 mg by mouth daily.   Cyanocobalamin 1000 MCG/ML Kit Inject 1 mL as directed every 30 (thirty) days.   fluticasone 50 MCG/ACT nasal spray Commonly known as: FLONASE Place 1 spray into both nostrils daily.   hydrOXYzine 50 MG tablet Commonly known as: ATARAX/VISTARIL Take 50 mg by mouth every 8 (eight) hours as needed.   ketorolac 60 MG/2ML Soln injection Commonly known as: TORADOL Inject 60 mg into the muscle once as needed.   Melatonin 1 MG Caps Take by mouth.   meloxicam 7.5 MG tablet Commonly known as:  MOBIC Take 7.5 mg by mouth daily as needed.   methylphenidate 5 MG tablet Commonly known as: RITALIN Take 5 mg by mouth daily as needed.   metroNIDAZOLE 0.75 % cream Commonly known as: METROCREAM Apply 1 application topically 2 (two) times daily.   mometasone 0.1 % cream Commonly known as: ELOCON Apply 1 application topically daily as needed.   montelukast 4 MG Pack Commonly known as: SINGULAIR Take 4 mg by mouth daily as needed.   omega-3 acid ethyl esters 1 g capsule Commonly known as: LOVAZA Take 1 g by mouth daily. Take 4 capsules daily   omega-3 acid ethyl esters 1 g capsule Commonly known as: LOVAZA Take 2 g by mouth 2 (two) times a day.   ONE-A-DAY WOMENS 50 PLUS PO Take by mouth.   PRE-NATAL PO Take by mouth.   PROBIOTIC ADVANCED PO Take by mouth.   propranolol 20 MG tablet Commonly known as: INDERAL 20 mg 4 (four) times daily.   pyridostigmine 60 MG tablet Commonly known as: MESTINON Take 60 mg by mouth 3 (three) times daily.   rosuvastatin 10 MG tablet Commonly known as: CRESTOR Take 10 mg by mouth daily.   sertraline 25 MG tablet Commonly known as: ZOLOFT Take 50 mg by mouth daily.   SUMAtriptan 25 MG tablet Commonly known as: IMITREX Take 100 mg by mouth daily as needed.   tamsulosin 0.4 MG Caps capsule Commonly known as: FLOMAX Take 0.4 mg by mouth.   zolpidem 5 MG tablet Commonly known as: AMBIEN Take 10 mg by mouth at bedtime.   Zomig 5 MG nasal solution Generic drug: zolmitriptan Place 1 spray into the nose daily as needed.         OBJECTIVE:   PHYSICAL EXAM: VS: BP 122/78 (BP Location: Left Arm, Patient Position: Sitting, Cuff Size: Large)   Pulse 83   Temp 97.9 F (36.6 C)   Ht 4\' 9"  (1.448 m)   Wt 173 lb (78.5 kg)   LMP  (Exact Date)   SpO2 97%   BMI 37.44 kg/m    EXAM: General: Pt appears well and is in NAD  Lungs: Clear with good BS bilat with no rales, rhonchi, or wheezes  Heart: Auscultation: RRR.   Abdomen: Normoactive bowel sounds, soft, nontender, without masses or organomegaly palpable  Extremities:  BL LE: No pretibial edema normal ROM and strength.  Mental Status: Judgment, insight: Intact Mood and affect: No depression, anxiety, or agitation     DATA REVIEWED: Results for FAHMIDA, MERGEL (MRN [id number]) as of 08/31/2019 11:16  Ref. Range 08/30/2019 10:47  Sodium Latest Ref Range: 135 - 145 mEq/L 134 (L)  Potassium Latest Ref Range: 3.5 - 5.1 mEq/L  3.9  Chloride Latest Ref Range: 96 - 112 mEq/L 104  CO2 Latest Ref Range: 19 - 32 mEq/L 23  Glucose Latest Ref Range: 70 - 99 mg/dL 95  BUN Latest Ref Range: 6 - 23 mg/dL 5 (L)  Creatinine Latest Ref Range: 0.40 - 1.20 mg/dL 4.09  Calcium Latest Ref Range: 8.4 - 10.5 mg/dL 9.2  Alkaline Phosphatase Latest Ref Range: 39 - 117 U/L 54  Albumin Latest Ref Range: 3.5 - 5.2 g/dL 4.0  AST Latest Ref Range: 0 - 37 U/L 14  ALT Latest Ref Range: 0 - 35 U/L 14  Total Protein Latest Ref Range: 6.0 - 8.3 g/dL 6.5  Total Bilirubin Latest Ref Range: 0.2 - 1.2 mg/dL 0.3  GFR Latest Ref Range: >60.00 mL/min 129.45  Total CHOL/HDL Ratio Unknown 6  Cholesterol Latest Ref Range: 0 - 200 mg/dL 811 (H)  HDL Cholesterol Latest Ref Range: >39.00 mg/dL 91.47 (L)  Direct LDL Latest Units: mg/dL 829.5  NonHDL Unknown 621.30  Triglycerides Latest Ref Range: 0.0 - 149.0 mg/dL 865.7 (H)  VLDL Latest Ref Range: 0.0 - 40.0 mg/dL 84.6 (H)    9/62/9528 Alp 47 U/L  AST 90  U/L  Alt 89 U/L T. Chol 286 mg/dL TG 413 HDL 41  LDL 244 mg/dL     ASSESSMENT / PLAN / RECOMMENDATIONS:   1. Combined Hyperlipidemia During pregnancy:  - Pt has no stigmata of hypercholesterolemia - No Hx of premature CAD , CVA or pancreatitis - She understands that lipid lowering therapies are CONTRAINDICATED during pregnancy and she will need to stop them when planning to conceive. Fenofibrates are sometimes initiated during the second trimester, there's no extensive data  on lovaza but thought to be safe at this point.  - Repeat labs shows some elevation in lipid profile but not high enough to warrant any lipid lowering agents at this time.  - We emphasized the importance of low fat diet , eating more fruits and vegetables    Medications :  Stop  Lovaza 4 grams daily Stop  Crestor 10 mg daily     2. NASH (nonalcoholic steatohepatitis)  - Negative viral work up per records  - Evidence of nash on ultrasound 03/2018 - LFT's continue to be normal     F/U in 3 months    Signed electronically by: Lyndle Herrlich, MD  [facility]in[facility]  [facility] [address] [place] [place], [place] [address] Phone: [phone] FAX: [phone]      CC: Mila Palmer, MD [address] [place] [address] Phone: [phone]  Fax: [phone]   Return to Endocrinology clinic as below: Future Appointments  Date Time Provider Department Center  12/04/2019 10:10 AM Jahzaria Vary, Konrad Dolores, MD [facility] None

## 2019-08-30 ENCOUNTER — Other Ambulatory Visit: Payer: Self-pay

## 2019-08-30 ENCOUNTER — Encounter: Payer: Self-pay | Admitting: Internal Medicine

## 2019-08-30 ENCOUNTER — Ambulatory Visit: Payer: Medicaid Other | Admitting: Internal Medicine

## 2019-08-30 VITALS — BP 122/78 | HR 83 | Temp 97.9°F | Ht <= 58 in | Wt 173.0 lb

## 2019-08-30 DIAGNOSIS — K7581 Nonalcoholic steatohepatitis (NASH): Secondary | ICD-10-CM | POA: Diagnosis not present

## 2019-08-30 DIAGNOSIS — E782 Mixed hyperlipidemia: Secondary | ICD-10-CM

## 2019-08-30 LAB — COMPREHENSIVE METABOLIC PANEL
ALT: 14 U/L (ref 0–35)
AST: 14 U/L (ref 0–37)
Albumin: 4 g/dL (ref 3.5–5.2)
Alkaline Phosphatase: 54 U/L (ref 39–117)
BUN: 5 mg/dL — ABNORMAL LOW (ref 6–23)
CO2: 23 mEq/L (ref 19–32)
Calcium: 9.2 mg/dL (ref 8.4–10.5)
Chloride: 104 mEq/L (ref 96–112)
Creatinine, Ser: 0.55 mg/dL (ref 0.40–1.20)
GFR: 129.45 mL/min (ref >60.00)
Glucose, Bld: 95 mg/dL (ref 70–99)
Potassium: 3.9 mEq/L (ref 3.5–5.1)
Sodium: 134 mEq/L — ABNORMAL LOW (ref 135–145)
Total Bilirubin: 0.3 mg/dL (ref 0.2–1.2)
Total Protein: 6.5 g/dL (ref 6.0–8.3)

## 2019-08-30 LAB — LIPID PANEL
Cholesterol: 234 mg/dL — ABNORMAL HIGH (ref 0–200)
HDL: 37.4 mg/dL — ABNORMAL LOW (ref >39.00)
NonHDL: 196.56
Total CHOL/HDL Ratio: 6
Triglycerides: 230 mg/dL — ABNORMAL HIGH (ref 0.0–149.0)
VLDL: 46 mg/dL — ABNORMAL HIGH (ref 0.0–40.0)

## 2019-08-30 LAB — LDL CHOLESTEROL, DIRECT: Direct LDL: 161 mg/dL

## 2019-08-30 NOTE — Patient Instructions (Signed)
-   Please avoid fried foods and foods that have high fat content, please eat more fruits and vegetables.

## 2019-11-03 ENCOUNTER — Emergency Department (HOSPITAL_BASED_OUTPATIENT_CLINIC_OR_DEPARTMENT_OTHER)
Admission: EM | Admit: 2019-11-03 | Discharge: 2019-11-03 | Disposition: A | Discharge status: Home / Self Care | Payer: Medicaid Other | Attending: Emergency Medicine | Admitting: Emergency Medicine

## 2019-11-03 ENCOUNTER — Encounter (HOSPITAL_BASED_OUTPATIENT_CLINIC_OR_DEPARTMENT_OTHER): Payer: Self-pay

## 2019-11-03 ENCOUNTER — Other Ambulatory Visit: Payer: Self-pay

## 2019-11-03 DIAGNOSIS — Z3A23 23 weeks gestation of pregnancy: Secondary | ICD-10-CM | POA: Diagnosis not present

## 2019-11-03 DIAGNOSIS — O99352 Diseases of the nervous system complicating pregnancy, second trimester: Secondary | ICD-10-CM | POA: Diagnosis present

## 2019-11-03 DIAGNOSIS — G43909 Migraine, unspecified, not intractable, without status migrainosus: Secondary | ICD-10-CM

## 2019-11-03 HISTORY — DX: Postural orthostatic tachycardia syndrome (POTS): G90.A

## 2019-11-03 HISTORY — DX: Other specified cardiac arrhythmias: I49.8

## 2019-11-03 LAB — URINALYSIS, ROUTINE W REFLEX MICROSCOPIC
Bilirubin Urine: NEGATIVE
Glucose, UA: NEGATIVE mg/dL
Hgb urine dipstick: NEGATIVE
Ketones, ur: NEGATIVE mg/dL
Nitrite: NEGATIVE
Protein, ur: NEGATIVE mg/dL
Specific Gravity, Urine: 1.02 (ref 1.005–1.030)
pH: 7 (ref 5.0–8.0)

## 2019-11-03 LAB — CBC WITH DIFFERENTIAL/PLATELET
Abs Immature Granulocytes: 0.08 10*3/uL — ABNORMAL HIGH (ref 0.00–0.07)
Basophils Absolute: 0 10*3/uL (ref 0.0–0.1)
Basophils Relative: 0 %
Eosinophils Absolute: 0.1 10*3/uL (ref 0.0–0.5)
Eosinophils Relative: 1 %
HCT: 40.2 % (ref 36.0–46.0)
Hemoglobin: 13.5 g/dL (ref 12.0–15.0)
Immature Granulocytes: 1 %
Lymphocytes Relative: 22 %
Lymphs Abs: 2.8 10*3/uL (ref 0.7–4.0)
MCH: 31.1 pg (ref 26.0–34.0)
MCHC: 33.6 g/dL (ref 30.0–36.0)
MCV: 92.6 fL (ref 80.0–100.0)
Monocytes Absolute: 0.6 10*3/uL (ref 0.1–1.0)
Monocytes Relative: 5 %
Neutro Abs: 8.7 10*3/uL — ABNORMAL HIGH (ref 1.7–7.7)
Neutrophils Relative %: 71 %
Platelets: 208 10*3/uL (ref 150–400)
RBC: 4.34 MIL/uL (ref 3.87–5.11)
RDW: 13.8 % (ref 11.5–15.5)
WBC: 12.3 10*3/uL — ABNORMAL HIGH (ref 4.0–10.5)
nRBC: 0 % (ref 0.0–0.2)

## 2019-11-03 LAB — URINALYSIS, MICROSCOPIC (REFLEX): RBC / HPF: NONE SEEN RBC/hpf (ref 0–5)

## 2019-11-03 LAB — COMPREHENSIVE METABOLIC PANEL
ALT: 12 U/L (ref 0–44)
AST: 14 U/L — ABNORMAL LOW (ref 15–41)
Albumin: 3.5 g/dL (ref 3.5–5.0)
Alkaline Phosphatase: 56 U/L (ref 38–126)
Anion gap: 13 (ref 5–15)
BUN: 5 mg/dL — ABNORMAL LOW (ref 6–20)
CO2: 23 mmol/L (ref 22–32)
Calcium: 9 mg/dL (ref 8.9–10.3)
Chloride: 103 mmol/L (ref 98–111)
Creatinine, Ser: 0.49 mg/dL (ref 0.44–1.00)
GFR calc Af Amer: >60 mL/min (ref >60)
GFR calc non Af Amer: >60 mL/min (ref >60)
Glucose, Bld: 79 mg/dL (ref 70–99)
Potassium: 4.2 mmol/L (ref 3.5–5.1)
Sodium: 139 mmol/L (ref 135–145)
Total Bilirubin: 0.2 mg/dL — ABNORMAL LOW (ref 0.3–1.2)
Total Protein: 6.9 g/dL (ref 6.5–8.1)

## 2019-11-03 MED ORDER — METOCLOPRAMIDE HCL 5 MG/ML IJ SOLN
10.0000 mg | Freq: Once | INTRAMUSCULAR | Status: AC
  Administered 2019-11-03: 10 mg via INTRAVENOUS
  Filled 2019-11-03: qty 2

## 2019-11-03 MED ORDER — SODIUM CHLORIDE 0.9 % IV BOLUS
1000.0000 mL | Freq: Once | INTRAVENOUS | Status: AC
  Administered 2019-11-03: 1000 mL via INTRAVENOUS

## 2019-11-03 MED ORDER — ACETAMINOPHEN 500 MG PO TABS
1000.0000 mg | ORAL_TABLET | Freq: Once | ORAL | Status: AC
  Administered 2019-11-03: 1000 mg via ORAL
  Filled 2019-11-03: qty 2

## 2019-11-03 MED ORDER — DIPHENHYDRAMINE HCL 50 MG/ML IJ SOLN
25.0000 mg | Freq: Once | INTRAMUSCULAR | Status: AC
  Administered 2019-11-03: 25 mg via INTRAVENOUS
  Filled 2019-11-03: qty 1

## 2019-11-03 NOTE — ED Provider Notes (Signed)
[facility] EMERGENCY DEPARTMENT Provider Note   CSN: [id number] Arrival date & time: 11/03/19  1525     History Chief Complaint  Patient presents with  . Migraine    [name] [name] is a 29 y.o. female.  HPI 29 year old female presents with headache. She is about [redacted] weeks pregnant.  She has a longstanding history of migraines and this feels similar but more severe. Ongoing for 3 days. It is right-sided and occipital which is typical for her. No fevers or vomiting though she does have photophobia, some blurred vision and phonophobia (all of which are typical). No focal weakness or numbness. Has tried some Tylenol, Fioricet. Pain is currently a 9/10. Is noted to have high BP here, states it often has been going up with pain, and she is on propranolol at home. She last took tylenol yesterday. Chronic ankle swelling, unchanged. Some mild vague abdominal discomfort for weeks. No vaginal bleeding.    Past Medical History:  Diagnosis Date  . Depression   . EDS (Ehlers-Danlos syndrome)   . Hyperlipidemia   . Migraine   . POTS (postural orthostatic tachycardia syndrome)   . Transaminitis   . Vitamin B12 deficiency   . Vitamin D deficiency     Patient Active Problem List   Diagnosis Date Noted  . NASH (nonalcoholic steatohepatitis) 06/26/2019  . Combined hyperlipidemia 06/26/2019    Past Surgical History:  Procedure Laterality Date  . EYE SURGERY     x2  . WISDOM TOOTH EXTRACTION       OB History    Gravida  1   Para      Term      Preterm      AB      Living        SAB      TAB      Ectopic      Multiple      Live Births              Family History  Problem Relation Age of Onset  . Hypertension Mother   . Anxiety disorder Mother   . Other Father        unknown  . Osteoarthritis Paternal Grandmother     Social History   Tobacco Use  . Smoking status: Never Smoker  . Smokeless tobacco: Never Used  Vaping Use  . Vaping Use: Never  used  Substance Use Topics  . Alcohol use: Not Currently  . Drug use: Not Currently    Home Medications Prior to Admission medications   Medication Sig Start Date End Date Taking? Authorizing Provider  ALPRAZolam Duanne Moron) 0.5 MG tablet Take 0.5 mg by mouth 3 (three) times daily as needed.     [provider]  Azelastine-Fluticasone 137-50 MCG/ACT SUSP Place 1 spray into the nose 2 (two) times a day.    [provider]  baclofen (LIORESAL) 10 MG tablet Take 20 mg by mouth 3 (three) times daily as needed.     [provider]  Botulinum Toxin Type A (BOTOX) 200 units SOLR Inject as directed every 3 (three) months.    [provider]  butalbital-acetaminophen-caffeine (FIORICET) 50-325-40 MG tablet Take 1 tablet by mouth every 4 (four) hours as needed. 10/13/19   [provider]  cetirizine (ZYRTEC) 10 MG tablet Take 10 mg by mouth daily.    [provider]  Cyanocobalamin 1000 MCG/ML KIT Inject 1 mL as directed every 30 (thirty) days.    [provider]  Erenumab-aooe (AIMOVIG) 92 MG/ML SOAJ Inject into the skin every 30 (thirty) days.    [provider]  fluticasone (FLONASE) 50 MCG/ACT nasal spray Place 1 spray into both nostrils daily.    [provider]  hydrOXYzine (ATARAX/VISTARIL) 50 MG tablet Take 50 mg by mouth every 8 (eight) hours as needed.     [provider]  ketorolac (TORADOL) 60 MG/2ML SOLN injection Inject 60 mg into the muscle once as needed.  10/22/14   [provider]  Melatonin 1 MG CAPS Take by mouth.    [provider]  meloxicam (MOBIC) 7.5 MG tablet Take 7.5 mg by mouth daily as needed.  11/24/14   [provider]  methylphenidate (RITALIN) 5 MG tablet Take 5 mg by mouth daily as needed.     [provider]  metroNIDAZOLE (METROCREAM) 0.75 % cream Apply 1 application topically 2 (two) times daily.    [provider]  mometasone (ELOCON) 0.1 %  cream Apply 1 application topically daily as needed.     [provider]  montelukast (SINGULAIR) 4 MG PACK Take 4 mg by mouth daily as needed.    [provider]  Multiple Vitamins-Minerals (ONE-A-DAY WOMENS 50 PLUS PO) Take by mouth.    [provider]  omega-3 acid ethyl esters (LOVAZA) 1 g capsule Take 1 g by mouth daily. Take 4 capsules daily    [provider]  omega-3 acid ethyl esters (LOVAZA) 1 g capsule Take 2 g by mouth 2 (two) times a day.    [provider]  Prenatal Multivit-Min-Fe-FA (PRE-NATAL PO) Take by mouth.    [provider]  Probiotic Product (PROBIOTIC ADVANCED PO) Take by mouth.    [provider]  propranolol (INDERAL) 20 MG tablet 20 mg 4 (four) times daily.  08/26/18   [provider]  pyridostigmine (MESTINON) 60 MG tablet Take 60 mg by mouth 3 (three) times daily. 01/22/14   [provider]  rosuvastatin (CRESTOR) 10 MG tablet Take 10 mg by mouth daily.    [provider]  sertraline (ZOLOFT) 25 MG tablet Take 50 mg by mouth daily.     [provider]  SUMAtriptan (IMITREX) 25 MG tablet Take 100 mg by mouth daily as needed.    [provider]  tamsulosin (FLOMAX) 0.4 MG CAPS capsule Take 0.4 mg by mouth.    [provider]  zolmitriptan (ZOMIG) 5 MG nasal solution Place 1 spray into the nose daily as needed.     [provider]  zolpidem (AMBIEN) 5 MG tablet Take 10 mg by mouth at bedtime.     [provider]    Allergies    Patient has no known allergies.  Review of Systems   Review of Systems  Constitutional: Negative for fever.  Eyes: Positive for photophobia.  Cardiovascular: Negative for chest pain.  Gastrointestinal: Negative for nausea and vomiting.  Genitourinary: Negative for dysuria and vaginal bleeding.  Neurological: Positive for headaches. Negative for weakness and numbness.  All other systems reviewed and are  negative.   Physical Exam Updated Vital Signs BP 114/78   Pulse 86   Temp 97.9 F (36.6 C) (Oral)   Resp 18   Ht 4' 9"  (1.448 m)   Wt 81.2 kg   SpO2 99%   BMI 38.74 kg/m   Physical Exam Vitals and nursing note reviewed.  Constitutional:      General: She is not in acute distress.  Appearance: She is well-developed. She is not ill-appearing or diaphoretic.  HENT:     Head: Normocephalic and atraumatic.     Right Ear: External ear normal.     Left Ear: External ear normal.     Nose: Nose normal.  Eyes:     General:        Right eye: No discharge.        Left eye: No discharge.     Extraocular Movements: Extraocular movements intact.     Pupils: Pupils are equal, round, and reactive to light.  Cardiovascular:     Rate and Rhythm: Normal rate and regular rhythm.     Heart sounds: Normal heart sounds.  Pulmonary:     Effort: Pulmonary effort is normal.     Breath sounds: Normal breath sounds.  Abdominal:     Palpations: Abdomen is soft.     Comments: gravid  Skin:    General: Skin is warm and dry.  Neurological:     Mental Status: She is alert.     Comments: CN 3-12 grossly intact. 5/5 strength in all 4 extremities.  Normal finger to nose.   Psychiatric:        Mood and Affect: Mood is not anxious.     ED Results / Procedures / Treatments   Labs (all labs ordered are listed, but only abnormal results are displayed) Labs Reviewed  URINALYSIS, ROUTINE W REFLEX MICROSCOPIC - Abnormal; Notable for the following components:      Result Value   Leukocytes,Ua TRACE (*)    All other components within normal limits  COMPREHENSIVE METABOLIC PANEL - Abnormal; Notable for the following components:   BUN 5 (*)    AST 14 (*)    Total Bilirubin 0.2 (*)    All other components within normal limits  CBC WITH DIFFERENTIAL/PLATELET - Abnormal; Notable for the following components:   WBC 12.3 (*)    Neutro Abs 8.7 (*)    Abs Immature Granulocytes 0.08 (*)    All other  components within normal limits  URINALYSIS, MICROSCOPIC (REFLEX) - Abnormal; Notable for the following components:   Bacteria, UA FEW (*)    All other components within normal limits    EKG None  Radiology No results found.  Procedures Procedures (including critical care time)  Medications Ordered in ED Medications  sodium chloride 0.9 % bolus 1,000 mL ( Intravenous Stopped 11/03/19 2146)  acetaminophen (TYLENOL) tablet 1,000 mg (1,000 mg Oral Given 11/03/19 2053)  metoCLOPramide (REGLAN) injection 10 mg (10 mg Intravenous Given 11/03/19 2101)  diphenhydrAMINE (BENADRYL) injection 25 mg (25 mg Intravenous Given 11/03/19 2045)    ED Course  I have reviewed the triage vital signs and the nursing notes.  Pertinent labs & imaging results that were available during my care of the patient were reviewed by me and considered in my medical decision making (see chart for details).    MDM Rules/Calculators/A&P                          Patient's migraine is similar to prior episodes, though a little stronger today. Given this, along with benign exam, my suspicion of CNS emergency is pretty low. She is feeling better with IV treatment. Feels well enough to go home. She is pregnant and was pretty hypertensive on arrival though this has resolved with treatment of her headache.  My suspicion that this is preeclampsia is unlikely.  Doubt HELLP.  Labs reviewed  and overall benign.  Patient appears stable for follow up with her OBGYN and her neurologist.  Final Clinical Impression(s) / ED Diagnoses Final diagnoses:  Migraine without status migrainosus, not intractable, unspecified migraine type    Rx / DC Orders ED Discharge Orders    None       Sherwood Gambler, MD 11/03/19 2320

## 2019-11-03 NOTE — ED Triage Notes (Signed)
Pt c/o migraine x 3 days-pt states she is 23 weeks pregant-NAD-slow gait with sunglasses

## 2019-11-03 NOTE — Discharge Instructions (Signed)
If you develop continued, recurrent, or worsening headache, fever, neck stiffness, vomiting, blurry or double vision, weakness or numbness in your arms or legs, trouble speaking, or any other new/concerning symptoms then return to the ER for evaluation.  

## 2019-11-07 ENCOUNTER — Ambulatory Visit: Payer: Medicaid Other | Attending: Obstetrics and Gynecology | Admitting: Physical Therapy

## 2019-11-07 ENCOUNTER — Other Ambulatory Visit: Payer: Self-pay

## 2019-11-07 VITALS — BP 128/94 | HR 95

## 2019-11-07 DIAGNOSIS — I498 Other specified cardiac arrhythmias: Secondary | ICD-10-CM | POA: Insufficient documentation

## 2019-11-07 DIAGNOSIS — R42 Dizziness and giddiness: Secondary | ICD-10-CM | POA: Insufficient documentation

## 2019-11-07 DIAGNOSIS — M357 Hypermobility syndrome: Secondary | ICD-10-CM | POA: Diagnosis present

## 2019-11-07 DIAGNOSIS — G90A Postural orthostatic tachycardia syndrome (POTS): Secondary | ICD-10-CM

## 2019-11-08 NOTE — Therapy (Signed)
[name] [name], [place], [address] Phone: [phone]   Fax:  [phone]  Physical Therapy Evaluation  Patient Details  Name: [name] [name] MRN: [id number] Date of Birth: 08/25/1990 Referring Provider (PT): Dr. Janyth Contes (Cardiologist- Dr. Dinah Beers at [facility] )   Encounter Date: 11/07/2019   PT End of Session - 11/08/19 1436    Visit Number 1    Number of Visits 16    Date for PT Re-Evaluation 01/02/20    Authorization Type [employer]    PT Start Time 7591    PT Stop Time 1130    PT Time Calculation (min) 45 min    Activity Tolerance Patient tolerated treatment well    Behavior During Therapy [facility] for tasks assessed/performed           Past Medical History:  Diagnosis Date  . Depression   . EDS (Ehlers-Danlos syndrome)   . Hyperlipidemia   . Migraine   . POTS (postural orthostatic tachycardia syndrome)   . Transaminitis   . Vitamin B12 deficiency   . Vitamin D deficiency     Past Surgical History:  Procedure Laterality Date  . EYE SURGERY     x2  . WISDOM TOOTH EXTRACTION      Vitals:   11/07/19 1052  BP: (!) 128/94  Pulse: 95  SpO2: 99%      Subjective Assessment - 11/07/19 1052    Subjective My heart rate and BP have been all over the place.  She would like to be able to control it with exercise.  She has had problems with hips and elbows. She was doing exercises prior to being pregnant but migraines and dizziness. She has difficulty standing long periods, hard to get up and down stairs.   Dizzy in AM.    Pertinent History hypermobility, migraines, hip and back pain    Limitations Standing;Walking    Diagnostic tests none    Patient Stated Goals Patient wants be strong enough to manage baby    Currently in Pain? Yes    Pain Score 1     Pain Location Hip    Pain Orientation Right    Pain Descriptors / Indicators Sore    Pain Type Chronic pain     Pain Onset More than a month ago    Pain Frequency Intermittent    Aggravating Factors  standing, walking, sleeping wrong    Pain Relieving Factors rest    Effect of Pain on Daily Activities limited by POTS > pain    Multiple Pain Sites No              [facility] PT Assessment - 11/08/19 0001      Assessment   Medical Diagnosis POTS    Referring Provider (PT) Dr. Jeral Fruit Bovard-Stuckert   Cardiologist- [facility] Din[facility]ers at [facility]    Onset Date/Surgical Date --   chronic    Prior Therapy yes      Precautions   Precaution Comments pregnancy 24 weeks, faints but can predict        Restrictions   Weight Bearing Restrictions No    Other Position/Activity Restrictions --   has compression hose     Balance Screen   Has the patient fallen in the past 6 months Yes    How many times? 1   1   Has the patient had a decrease in activity level because of a fear of falling?  Yes  of Care Patient           Patient will benefit from skilled therapeutic intervention in order to improve the following deficits and impairments:  Cardiopulmonary status limiting activity, Decreased activity tolerance, Decreased endurance, Dizziness, Pain, Improper body mechanics, Postural dysfunction, Hypermobility, Decreased strength, Obesity, Difficulty walking, Increased edema  Visit Diagnosis: Hypermobility syndrome  POTS (postural orthostatic tachycardia syndrome)  Dizziness and giddiness     Problem List Patient Active Problem List   Diagnosis Date Noted  . NASH (nonalcoholic steatohepatitis) 06/26/2019  . Combined hyperlipidemia 06/26/2019    Dellia Donnelly 11/08/2019, 2:55 PM  [facility] [address] [place], [place], [address] Phone: [phone]   Fax:  [phone]  Name: [name] [name] MRN: [id number] Date of Birth: [birth year]   Raeford Razor, PT 11/08/19 2:56 PM Phone: [phone] Fax: [phone]  Check all possible CPT codes:      [x]  97110 (Therapeutic Exercise)  []  92507 (SLP Treatment)  [x]  97112 (Neuro Re-ed)   []  50037 (Swallowing Treatment)   []  97116 (Gait Training)   []  [phone] (Cognitive Training, 1st 15 minutes) [x]  97140 (Manual Therapy)   []  97130 (Cognitive  Training, each add'l 15 minutes)  [x]  97530 (Therapeutic Activities)  []  Other, List CPT Code ____________    [x]  91694 (Self Care)       []  All codes above (97110 - 97535)  []  97012 (Mechanical Traction)  []  97014 (E-stim Unattended)  []  97032 (E-stim manual)  []  97033 (Ionto)  []  97035 (Ultrasound)  []  97016 (Vaso)  []  97760 (Orthotic Fit) []  [id number] (Prosthetic Training) []  [id number] (Physical Performance Training) []  [id number] (Aquatic Therapy) []  [id number] (Canalith Repositioning) []  [id number] (Contrast Bath) []  [id number] (Paraffin) []  97597 (Wound Care 1st 20 sq cm) []  97598 (Wound Care each add'l 20 sq cm)  of Care Patient           Patient will benefit from skilled therapeutic intervention in order to improve the following deficits and impairments:  Cardiopulmonary status limiting activity, Decreased activity tolerance, Decreased endurance, Dizziness, Pain, Improper body mechanics, Postural dysfunction, Hypermobility, Decreased strength, Obesity, Difficulty walking, Increased edema  Visit Diagnosis: Hypermobility syndrome  POTS (postural orthostatic tachycardia syndrome)  Dizziness and giddiness     Problem List Patient Active Problem List   Diagnosis Date Noted  . NASH (nonalcoholic steatohepatitis) 06/26/2019  . Combined hyperlipidemia 06/26/2019    Dellia Donnelly 11/08/2019, 2:55 PM  [facility] [address] [place], [place], [address] Phone: [phone]   Fax:  [phone]  Name: [name] [name] MRN: [id number] Date of Birth: [birth year]   Raeford Razor, PT 11/08/19 2:56 PM Phone: [phone] Fax: [phone]  Check all possible CPT codes:      [x]  97110 (Therapeutic Exercise)  []  92507 (SLP Treatment)  [x]  97112 (Neuro Re-ed)   []  50037 (Swallowing Treatment)   []  97116 (Gait Training)   []  [phone] (Cognitive Training, 1st 15 minutes) [x]  97140 (Manual Therapy)   []  97130 (Cognitive  Training, each add'l 15 minutes)  [x]  97530 (Therapeutic Activities)  []  Other, List CPT Code ____________    [x]  91694 (Self Care)       []  All codes above (97110 - 97535)  []  97012 (Mechanical Traction)  []  97014 (E-stim Unattended)  []  97032 (E-stim manual)  []  97033 (Ionto)  []  97035 (Ultrasound)  []  97016 (Vaso)  []  97760 (Orthotic Fit) []  [id number] (Prosthetic Training) []  [id number] (Physical Performance Training) []  [id number] (Aquatic Therapy) []  [id number] (Canalith Repositioning) []  [id number] (Contrast Bath) []  [id number] (Paraffin) []  97597 (Wound Care 1st 20 sq cm) []  97598 (Wound Care each add'l 20 sq cm)  of Care Patient           Patient will benefit from skilled therapeutic intervention in order to improve the following deficits and impairments:  Cardiopulmonary status limiting activity, Decreased activity tolerance, Decreased endurance, Dizziness, Pain, Improper body mechanics, Postural dysfunction, Hypermobility, Decreased strength, Obesity, Difficulty walking, Increased edema  Visit Diagnosis: Hypermobility syndrome  POTS (postural orthostatic tachycardia syndrome)  Dizziness and giddiness     Problem List Patient Active Problem List   Diagnosis Date Noted  . NASH (nonalcoholic steatohepatitis) 06/26/2019  . Combined hyperlipidemia 06/26/2019    Dellia Donnelly 11/08/2019, 2:55 PM  [facility] [address] [place], [place], [address] Phone: [phone]   Fax:  [phone]  Name: [name] [name] MRN: [id number] Date of Birth: [birth year]   Raeford Razor, PT 11/08/19 2:56 PM Phone: [phone] Fax: [phone]  Check all possible CPT codes:      [x]  97110 (Therapeutic Exercise)  []  92507 (SLP Treatment)  [x]  97112 (Neuro Re-ed)   []  50037 (Swallowing Treatment)   []  97116 (Gait Training)   []  [phone] (Cognitive Training, 1st 15 minutes) [x]  97140 (Manual Therapy)   []  97130 (Cognitive  Training, each add'l 15 minutes)  [x]  97530 (Therapeutic Activities)  []  Other, List CPT Code ____________    [x]  91694 (Self Care)       []  All codes above (97110 - 97535)  []  97012 (Mechanical Traction)  []  97014 (E-stim Unattended)  []  97032 (E-stim manual)  []  97033 (Ionto)  []  97035 (Ultrasound)  []  97016 (Vaso)  []  97760 (Orthotic Fit) []  [id number] (Prosthetic Training) []  [id number] (Physical Performance Training) []  [id number] (Aquatic Therapy) []  [id number] (Canalith Repositioning) []  [id number] (Contrast Bath) []  [id number] (Paraffin) []  97597 (Wound Care 1st 20 sq cm) []  97598 (Wound Care each add'l 20 sq cm)

## 2019-11-14 ENCOUNTER — Other Ambulatory Visit: Payer: Self-pay

## 2019-11-14 ENCOUNTER — Ambulatory Visit: Payer: Medicaid Other

## 2019-11-14 VITALS — BP 120/80 | HR 88

## 2019-11-14 DIAGNOSIS — I498 Other specified cardiac arrhythmias: Secondary | ICD-10-CM

## 2019-11-14 DIAGNOSIS — M357 Hypermobility syndrome: Secondary | ICD-10-CM

## 2019-11-14 DIAGNOSIS — G90A Postural orthostatic tachycardia syndrome (POTS): Secondary | ICD-10-CM

## 2019-11-14 DIAGNOSIS — R42 Dizziness and giddiness: Secondary | ICD-10-CM

## 2019-11-14 NOTE — Therapy (Signed)
pregnancy    Examination-Activity Limitations Lift;Stand;Locomotion Level;Bend;Carry;Sleep;Squat;Stairs    Examination-Participation Restrictions Community Activity;Interpersonal Relationship;Laundry;Cleaning;Meal Prep    Stability/Clinical Decision Making Evolving/Moderate complexity    Clinical Decision Making  Moderate    Rehab Potential Excellent    PT Frequency 2x / week    PT Duration 8 weeks    PT Treatment/Interventions ADLs/Self Care Home Management;Patient/family education;Taping;Therapeutic exercise;Manual techniques;Functional mobility training;Neuromuscular re-education;Cryotherapy;Moist Heat    PT Next Visit Plan Progress activity tolerance and core/back strengthening as indicated    PT Home Exercise Plan [email or web address]    Consulted and Agree with Plan of Care Patient           Patient will benefit from skilled therapeutic intervention in order to improve the following deficits and impairments:  Cardiopulmonary status limiting activity, Decreased activity tolerance, Decreased endurance, Dizziness, Pain, Improper body mechanics, Postural dysfunction, Hypermobility, Decreased strength, Obesity, Difficulty walking, Increased edema  Visit Diagnosis: Hypermobility syndrome  POTS (postural orthostatic tachycardia syndrome)  Dizziness and giddiness     Problem List Patient Active Problem List   Diagnosis Date Noted  . NASH (nonalcoholic steatohepatitis) 06/26/2019  . Combined hyperlipidemia 06/26/2019    Gar Ponto MS, PT 11/14/19 12:49 PM  [facility] [address] [place], [place], [address] Phone: [phone]   Fax:  [phone]  Name: [name] [name] MRN: [id number] Date of Birth: [birth year]  [place], [place], [address] Phone: [phone]   Fax:  [phone]  Physical Therapy Treatment  Patient Details  Name: [name] [name] MRN: [id number] Date of Birth: July 24, 1990 Referring Provider (PT): Dr. Janyth Contes (Cardiologist- Dr. Dinah Beers at [facility] )   Encounter Date: 11/14/2019   PT End of Session - 11/14/19 1238    Visit Number 2    Number of Visits 16    Date for PT Re-Evaluation 01/02/20    Authorization Type [employer]    PT Start Time 3009    PT Stop Time 1139    PT Time Calculation (min) 41 min    Activity Tolerance Patient tolerated treatment well    Behavior During Therapy [facility] for tasks assessed/performed           Past Medical History:  Diagnosis Date  . Depression   . EDS (Ehlers-Danlos syndrome)   . Hyperlipidemia   . Migraine   . POTS (postural orthostatic tachycardia syndrome)   . Transaminitis   . Vitamin B12 deficiency   . Vitamin D deficiency     Past Surgical History:  Procedure Laterality Date  . EYE SURGERY     x2  . WISDOM TOOTH EXTRACTION      Vitals:   11/14/19 1112  BP: 120/80  Pulse: 88  SpO2: 99%     Subjective Assessment - 11/14/19 1110    Subjective Pt reports having a migraine today, but no pain with her hips. rates migraine pain a 3/10.    Patient Stated Goals Patient wants be strong enough to manage baby                             [facility] Adult PT Treatment/Exercise - 11/14/19 0001      Self-Care   Self-Care Other Self-Care Comments    Other Self-Care Comments  See Education/Instruction      Exercises   Exercises Lumbar      Lumbar Exercises: Standing   Scapular Retraction Strengthening;Both;10 reps    Theraband Level (Scapular Retraction) Level 3 (Green)    Scapular Retraction Limitations 2 sets    Shoulder Extension Strengthening;Both;10 reps    Theraband Level (Shoulder Extension)  Level 3 (Green)    Shoulder Extension Limitations 2 sets    Other Standing Lumbar Exercises Bicep curl; Green Tband; 10x2      Lumbar Exercises: Supine   Pelvic Tilt 10 reps    Pelvic Tilt Limitations 3 sec    Bent Knee Raise 10 reps    Bent Knee Raise Limitations 2 sets      Knee/Hip Exercises: Aerobic   Nustep 10 mins; L3; 5 mins- O2 sat 95%, HR 99, Fairly light exertion. 10 mins- O2 sat 99%, HR 100, Somwhat hard exertion                  PT Education - 11/14/19 1235    Education Details HEP as above and for activity tolerance activities. Pt is going to utilize her recumbent bike CV activity 10 mins a day.    Person(s) Educated Patient    Methods Explanation;Demonstration;Tactile cues;Verbal cues;Handout    Comprehension Verbalized understanding;Returned demonstration;Verbal cues required;Tactile cues required;Need further instruction            PT Short Term Goals - 11/08/19 1437      PT SHORT TERM GOAL #1   Title Pt will self montitor RPE  pregnancy    Examination-Activity Limitations Lift;Stand;Locomotion Level;Bend;Carry;Sleep;Squat;Stairs    Examination-Participation Restrictions Community Activity;Interpersonal Relationship;Laundry;Cleaning;Meal Prep    Stability/Clinical Decision Making Evolving/Moderate complexity    Clinical Decision Making  Moderate    Rehab Potential Excellent    PT Frequency 2x / week    PT Duration 8 weeks    PT Treatment/Interventions ADLs/Self Care Home Management;Patient/family education;Taping;Therapeutic exercise;Manual techniques;Functional mobility training;Neuromuscular re-education;Cryotherapy;Moist Heat    PT Next Visit Plan Progress activity tolerance and core/back strengthening as indicated    PT Home Exercise Plan [email or web address]    Consulted and Agree with Plan of Care Patient           Patient will benefit from skilled therapeutic intervention in order to improve the following deficits and impairments:  Cardiopulmonary status limiting activity, Decreased activity tolerance, Decreased endurance, Dizziness, Pain, Improper body mechanics, Postural dysfunction, Hypermobility, Decreased strength, Obesity, Difficulty walking, Increased edema  Visit Diagnosis: Hypermobility syndrome  POTS (postural orthostatic tachycardia syndrome)  Dizziness and giddiness     Problem List Patient Active Problem List   Diagnosis Date Noted  . NASH (nonalcoholic steatohepatitis) 06/26/2019  . Combined hyperlipidemia 06/26/2019    Gar Ponto MS, PT 11/14/19 12:49 PM  [facility] [address] [place], [place], [address] Phone: [phone]   Fax:  [phone]  Name: [name] [name] MRN: [id number] Date of Birth: [birth year]

## 2019-11-21 ENCOUNTER — Other Ambulatory Visit: Payer: Self-pay

## 2019-11-21 ENCOUNTER — Ambulatory Visit: Payer: Medicaid Other | Admitting: Physical Therapy

## 2019-11-21 DIAGNOSIS — M357 Hypermobility syndrome: Secondary | ICD-10-CM | POA: Diagnosis not present

## 2019-11-21 DIAGNOSIS — R42 Dizziness and giddiness: Secondary | ICD-10-CM

## 2019-11-21 DIAGNOSIS — I498 Other specified cardiac arrhythmias: Secondary | ICD-10-CM

## 2019-11-21 DIAGNOSIS — G90A Postural orthostatic tachycardia syndrome (POTS): Secondary | ICD-10-CM

## 2019-11-21 NOTE — Therapy (Signed)
[place], [place], [address] Phone: [phone]   Fax:  [phone]  Physical Therapy Treatment  Patient Details  Name: [name] [name] MRN: [id number] Date of Birth: Apr 28, 1990 Referring Provider (PT): Dr. Janyth Contes (Cardiologist- Dr. Dinah Beers at [facility] )   Encounter Date: 11/21/2019   PT End of Session - 11/21/19 1142    Visit Number 3    Number of Visits 16    Date for PT Re-Evaluation 01/02/20    Authorization Type [employer]    PT Start Time 1110    PT Stop Time 1149    PT Time Calculation (min) 39 min    Activity Tolerance Patient tolerated treatment well    Behavior During Therapy [facility] for tasks assessed/performed           Past Medical History:  Diagnosis Date  . Depression   . EDS (Ehlers-Danlos syndrome)   . Hyperlipidemia   . Migraine   . POTS (postural orthostatic tachycardia syndrome)   . Transaminitis   . Vitamin B12 deficiency   . Vitamin D deficiency     Past Surgical History:  Procedure Laterality Date  . EYE SURGERY     x2  . WISDOM TOOTH EXTRACTION      There were no vitals filed for this visit.   Subjective Assessment - 11/21/19 1113    Subjective Pt reports nerve pain in her legs/ pregnancy pain.  I have a mild headache but able to participate. I feel like my hip needs to pop.    Pertinent History hypermobility, migraines, hip and back pain    Limitations Standing;Walking    Diagnostic tests none    Patient Stated Goals Patient wants be strong enough to manage baby    Currently in Pain? Yes    Pain Score 2     Pain Location Hip    Pain Orientation Right    Pain Descriptors / Indicators Sore    Pain Type Chronic pain    Pain Onset More than a month ago                             [facility] Adult PT Treatment/Exercise - 11/21/19 0001      Exercises   Exercises Lumbar      Lumbar Exercises: Standing   Other  Standing Lumbar Exercises squat x 20 with 4in step for short legs to maximize better posture for squat.  then with 10 lb KB to simulate future baby lifting from squat/chair position.    Other Standing Lumbar Exercises hip hinge with external cue on wall 20 x with VC for posture, PT demo  deadlift for goal of next visit to demo use of hip hinge      Lumbar Exercises: Supine   Pelvic Tilt 10 reps    Pelvic Tilt Limitations 10 sec    Bent Knee Raise 10 reps    Bent Knee Raise Limitations 2 sets    Bridge 10 reps    Bridge Limitations 2 sets with 10 lb wt      Lumbar Exercises: Prone   Other Prone Lumbar Exercises knee ext in prone 5 x each side, pt with decreased hip dissociation and rotates excessively.      Other Prone Lumbar Exercises cat camel to childs pose x 10      Knee/Hip Exercises: Aerobic   Nustep 10 mins; L4; 5 mins- O2 sat 99%,  Status New    Target Date 01/02/20             Access Code: [email or web address]: [email or web address]: 08/17/2021Prepared by: Thornton Dales   Squat with Chair Touch - 1 x daily - 7 x weekly - 2 sets - 10 reps  Supine Bridge - 1 x daily - 7 x weekly - 2 sets - 10 reps      Plan - 11/21/19 1133    Clinical Impression Statement Pt was focused on CV activity and pt maintained somewhat hard RPE and refusing pursed lip breathing for recovery of DOE.  Pt also educated on squat with use of wt  10 lb to simulate baby with equal  wt distribuation.  Pt stated she felt better after exercises.  Guido Sander also gave her handout of Clovis Riley protocol to use at home.  Added squat and bridge to HEP. will continue POC    Personal Factors and Comorbidities Comorbidity 1;Comorbidity 2    Comorbidities hypermobility, pregnancy    Examination-Activity Limitations Lift;Stand;Locomotion Level;Bend;Carry;Sleep;Squat;Stairs    Examination-Participation Restrictions Community Activity;Interpersonal Relationship;Laundry;Cleaning;Meal Prep    PT Treatment/Interventions ADLs/Self Care Home Management;Patient/family education;Taping;Therapeutic exercise;Manual techniques;Functional mobility training;Neuromuscular re-education;Cryotherapy;Moist Heat    PT Next Visit Plan Progress activity tolerance and core/back strengthening as indicated    PT Home Exercise Plan [email or web address]    Consulted and Agree with Plan of Care Patient           Patient will benefit from skilled therapeutic intervention in order to improve the following deficits and impairments:  Cardiopulmonary status limiting activity, Decreased activity tolerance, Decreased endurance, Dizziness, Pain, Improper body mechanics, Postural dysfunction, Hypermobility, Decreased strength, Obesity, Difficulty walking, Increased edema  Visit Diagnosis: Hypermobility syndrome  POTS (postural orthostatic tachycardia syndrome)  Dizziness and giddiness     Problem List Patient Active Problem List   Diagnosis Date Noted  . NASH (nonalcoholic steatohepatitis) 06/26/2019  . Combined hyperlipidemia 06/26/2019    Voncille Lo, PT Certified Exercise Expert for the Aging Adult  11/21/19 1:00 PM Phone: [phone] Fax: [facility] [address] [place], [place], [address] Phone: [phone]   Fax:  [phone]  Name: [name] [name] MRN: [id number] Date of Birth: [birth year]  [place], [place], [address] Phone: [phone]   Fax:  [phone]  Physical Therapy Treatment  Patient Details  Name: [name] [name] MRN: [id number] Date of Birth: Apr 28, 1990 Referring Provider (PT): Dr. Janyth Contes (Cardiologist- Dr. Dinah Beers at [facility] )   Encounter Date: 11/21/2019   PT End of Session - 11/21/19 1142    Visit Number 3    Number of Visits 16    Date for PT Re-Evaluation 01/02/20    Authorization Type [employer]    PT Start Time 1110    PT Stop Time 1149    PT Time Calculation (min) 39 min    Activity Tolerance Patient tolerated treatment well    Behavior During Therapy [facility] for tasks assessed/performed           Past Medical History:  Diagnosis Date  . Depression   . EDS (Ehlers-Danlos syndrome)   . Hyperlipidemia   . Migraine   . POTS (postural orthostatic tachycardia syndrome)   . Transaminitis   . Vitamin B12 deficiency   . Vitamin D deficiency     Past Surgical History:  Procedure Laterality Date  . EYE SURGERY     x2  . WISDOM TOOTH EXTRACTION      There were no vitals filed for this visit.   Subjective Assessment - 11/21/19 1113    Subjective Pt reports nerve pain in her legs/ pregnancy pain.  I have a mild headache but able to participate. I feel like my hip needs to pop.    Pertinent History hypermobility, migraines, hip and back pain    Limitations Standing;Walking    Diagnostic tests none    Patient Stated Goals Patient wants be strong enough to manage baby    Currently in Pain? Yes    Pain Score 2     Pain Location Hip    Pain Orientation Right    Pain Descriptors / Indicators Sore    Pain Type Chronic pain    Pain Onset More than a month ago                             [facility] Adult PT Treatment/Exercise - 11/21/19 0001      Exercises   Exercises Lumbar      Lumbar Exercises: Standing   Other  Standing Lumbar Exercises squat x 20 with 4in step for short legs to maximize better posture for squat.  then with 10 lb KB to simulate future baby lifting from squat/chair position.    Other Standing Lumbar Exercises hip hinge with external cue on wall 20 x with VC for posture, PT demo  deadlift for goal of next visit to demo use of hip hinge      Lumbar Exercises: Supine   Pelvic Tilt 10 reps    Pelvic Tilt Limitations 10 sec    Bent Knee Raise 10 reps    Bent Knee Raise Limitations 2 sets    Bridge 10 reps    Bridge Limitations 2 sets with 10 lb wt      Lumbar Exercises: Prone   Other Prone Lumbar Exercises knee ext in prone 5 x each side, pt with decreased hip dissociation and rotates excessively.      Other Prone Lumbar Exercises cat camel to childs pose x 10      Knee/Hip Exercises: Aerobic   Nustep 10 mins; L4; 5 mins- O2 sat 99%,

## 2019-11-21 NOTE — Patient Instructions (Signed)
     Voncille Lo, PT Certified Exercise Expert for the Aging Adult  11/21/19 11:41 AM Phone: [phone] Fax: [phone]

## 2019-11-23 ENCOUNTER — Encounter: Payer: Self-pay | Admitting: Physical Therapy

## 2019-11-23 ENCOUNTER — Ambulatory Visit: Payer: Medicaid Other | Admitting: Physical Therapy

## 2019-11-23 ENCOUNTER — Other Ambulatory Visit: Payer: Self-pay

## 2019-11-23 DIAGNOSIS — I498 Other specified cardiac arrhythmias: Secondary | ICD-10-CM

## 2019-11-23 DIAGNOSIS — M357 Hypermobility syndrome: Secondary | ICD-10-CM | POA: Diagnosis not present

## 2019-11-23 DIAGNOSIS — R42 Dizziness and giddiness: Secondary | ICD-10-CM

## 2019-11-23 DIAGNOSIS — G90A Postural orthostatic tachycardia syndrome (POTS): Secondary | ICD-10-CM

## 2019-11-23 NOTE — Patient Instructions (Signed)
°  Voncille Lo, PT Certified Exercise Expert for the Aging Adult  11/23/19 11:44 AM Phone: [phone] Fax: [phone]

## 2019-11-23 NOTE — Therapy (Signed)
[place], [place], [address] Phone: [phone]   Fax:  [phone]  Physical Therapy Treatment  Patient Details  Name: [name] [name] MRN: [id number] Date of Birth: February 04, 1991 Referring Provider (PT): Dr. Janyth Contes (Cardiologist- Dr. Dinah Beers at [facility] )   Encounter Date: 11/23/2019   PT End of Session - 11/23/19 1149    Visit Number 4    Number of Visits 16    Date for PT Re-Evaluation 01/02/20    Authorization Type [employer]    PT Start Time 1102    PT Stop Time 1147    PT Time Calculation (min) 45 min    Activity Tolerance Patient tolerated treatment well    Behavior During Therapy [facility] for tasks assessed/performed           Past Medical History:  Diagnosis Date  . Depression   . EDS (Ehlers-Danlos syndrome)   . Hyperlipidemia   . Migraine   . POTS (postural orthostatic tachycardia syndrome)   . Transaminitis   . Vitamin B12 deficiency   . Vitamin D deficiency     Past Surgical History:  Procedure Laterality Date  . EYE SURGERY     x2  . WISDOM TOOTH EXTRACTION      There were no vitals filed for this visit.   Subjective Assessment - 11/23/19 1107    Subjective I rode bike fo 15 minutes yesterday. I just have a mild pregnancy headache    Pertinent History hypermobility, migraines, hip and back pain    Limitations Standing;Walking    Diagnostic tests none    Patient Stated Goals Patient wants be strong enough to manage baby    Currently in Pain? No/denies    Pain Score 0-No pain   mild pregnancy headachel 2/10   Pain Location Hip    Pain Orientation Right    Pain Descriptors / Indicators Sore    Pain Type Chronic pain    Pain Onset More than a month ago    Pain Frequency Intermittent                             [facility] Adult PT Treatment/Exercise - 11/23/19 0001      Self-Care   Self-Care Other Self-Care Comments    Other  Self-Care Comments  handed out RPE handout and educated      Exercises   Exercises Lumbar      Lumbar Exercises: Standing   Scapular Retraction Strengthening;Both;10 reps    Theraband Level (Scapular Retraction) Level 3 (Green)    Scapular Retraction Limitations 2 sets VC to keep from hyper extending lower back and rib flare    Shoulder Extension Strengthening;Both;10 reps    Theraband Level (Shoulder Extension) Level 3 (Green)    Other Standing Lumbar Exercises squat 1 x 10 with 4in step for short legs to maximize better posture for squat.  then with 10 lb KB to simulate future baby lifting from squat/chair position. then with 12 lb x 10 reps    Other Standing Lumbar Exercises bicep curl with green t band 2 x 10, then 2 x 10 with brachioradialis 2 x 10, hip hinge with external cue on wall x 20      Lumbar Exercises: Supine   Pelvic Tilt 10 reps    Pelvic Tilt Limitations 10 sec    Bent Knee Raise 10 reps    Bent Knee Raise Limitations 2  Title Pt will be able to lift >10 lbs from the floor with proper body mechanics in prep for childcare    Baseline unknown, has not done    Time 8    Period Weeks    Status New    Target Date 01/02/20                 Plan - 11/23/19 1117    Clinical Impression Statement Pt reinforced HEP and added deadlift, squat and hip  hinge using 10-15 lb wts.  Pt had no pain at end of session and more confidant about holding  baby and rising from chairs etc.  Pt able to exercise entire session with minimal rest.  RPE 5/6.  Will continue with pilates/core strength with Guido Sander for rest of session.    Personal Factors and Comorbidities Comorbidity 1;Comorbidity 2    Comorbidities hypermobility, pregnancy    Examination-Activity Limitations Lift;Stand;Locomotion Level;Bend;Carry;Sleep;Squat;Stairs    Examination-Participation Restrictions Community Activity;Interpersonal Relationship;Laundry;Cleaning;Meal Prep    PT Frequency 2x / week    PT Duration 8 weeks    PT Treatment/Interventions ADLs/Self Care Home Management;Patient/family education;Taping;Therapeutic exercise;Manual techniques;Functional mobility training;Neuromuscular re-education;Cryotherapy;Moist Heat    PT Next Visit Plan Progress activity tolerance and core/back strengthening as indicated    PT Home Exercise Plan [email or web address]           Patient will benefit from skilled therapeutic intervention in order to improve the following deficits and impairments:  Cardiopulmonary status limiting activity, Decreased activity tolerance, Decreased endurance, Dizziness, Pain, Improper body mechanics, Postural dysfunction, Hypermobility, Decreased strength, Obesity, Difficulty walking, Increased edema  Visit Diagnosis: Hypermobility syndrome  POTS (postural orthostatic tachycardia syndrome)  Dizziness and giddiness  Access Code: [email or web address]: [email or web address]: 08/19/2021Prepared by: Donnetta Simpers BeardsleyExercises   Standing Hip Hinge with Dowel - 1 x daily - 7 x weekly - 3 sets - 10 reps  Kettlebell Deadlift - 1 x daily - 7 x weekly - 1 sets - 8 reps    Problem List Patient Active Problem List   Diagnosis Date Noted  . NASH (nonalcoholic steatohepatitis) 06/26/2019  . Combined hyperlipidemia 06/26/2019   Voncille Lo, PT Certified Exercise Expert for the Aging Adult  11/23/19 11:50 AM Phone: [phone] Fax: [facility] [address] [place], [place], [address] Phone: [phone]   Fax:  [phone]  Name: [name] [name] MRN: [id number] Date of Birth: Nov 14, 1990  [place], [place], [address] Phone: [phone]   Fax:  [phone]  Physical Therapy Treatment  Patient Details  Name: [name] [name] MRN: [id number] Date of Birth: February 04, 1991 Referring Provider (PT): Dr. Janyth Contes (Cardiologist- Dr. Dinah Beers at [facility] )   Encounter Date: 11/23/2019   PT End of Session - 11/23/19 1149    Visit Number 4    Number of Visits 16    Date for PT Re-Evaluation 01/02/20    Authorization Type [employer]    PT Start Time 1102    PT Stop Time 1147    PT Time Calculation (min) 45 min    Activity Tolerance Patient tolerated treatment well    Behavior During Therapy [facility] for tasks assessed/performed           Past Medical History:  Diagnosis Date  . Depression   . EDS (Ehlers-Danlos syndrome)   . Hyperlipidemia   . Migraine   . POTS (postural orthostatic tachycardia syndrome)   . Transaminitis   . Vitamin B12 deficiency   . Vitamin D deficiency     Past Surgical History:  Procedure Laterality Date  . EYE SURGERY     x2  . WISDOM TOOTH EXTRACTION      There were no vitals filed for this visit.   Subjective Assessment - 11/23/19 1107    Subjective I rode bike fo 15 minutes yesterday. I just have a mild pregnancy headache    Pertinent History hypermobility, migraines, hip and back pain    Limitations Standing;Walking    Diagnostic tests none    Patient Stated Goals Patient wants be strong enough to manage baby    Currently in Pain? No/denies    Pain Score 0-No pain   mild pregnancy headachel 2/10   Pain Location Hip    Pain Orientation Right    Pain Descriptors / Indicators Sore    Pain Type Chronic pain    Pain Onset More than a month ago    Pain Frequency Intermittent                             [facility] Adult PT Treatment/Exercise - 11/23/19 0001      Self-Care   Self-Care Other Self-Care Comments    Other  Self-Care Comments  handed out RPE handout and educated      Exercises   Exercises Lumbar      Lumbar Exercises: Standing   Scapular Retraction Strengthening;Both;10 reps    Theraband Level (Scapular Retraction) Level 3 (Green)    Scapular Retraction Limitations 2 sets VC to keep from hyper extending lower back and rib flare    Shoulder Extension Strengthening;Both;10 reps    Theraband Level (Shoulder Extension) Level 3 (Green)    Other Standing Lumbar Exercises squat 1 x 10 with 4in step for short legs to maximize better posture for squat.  then with 10 lb KB to simulate future baby lifting from squat/chair position. then with 12 lb x 10 reps    Other Standing Lumbar Exercises bicep curl with green t band 2 x 10, then 2 x 10 with brachioradialis 2 x 10, hip hinge with external cue on wall x 20      Lumbar Exercises: Supine   Pelvic Tilt 10 reps    Pelvic Tilt Limitations 10 sec    Bent Knee Raise 10 reps    Bent Knee Raise Limitations 2

## 2019-11-27 ENCOUNTER — Encounter: Payer: Self-pay | Admitting: Physical Therapy

## 2019-11-27 ENCOUNTER — Other Ambulatory Visit: Payer: Self-pay

## 2019-11-27 ENCOUNTER — Ambulatory Visit: Payer: Medicaid Other | Admitting: Physical Therapy

## 2019-11-27 DIAGNOSIS — M357 Hypermobility syndrome: Secondary | ICD-10-CM

## 2019-11-27 DIAGNOSIS — I498 Other specified cardiac arrhythmias: Secondary | ICD-10-CM

## 2019-11-27 DIAGNOSIS — G90A Postural orthostatic tachycardia syndrome (POTS): Secondary | ICD-10-CM

## 2019-11-27 DIAGNOSIS — R42 Dizziness and giddiness: Secondary | ICD-10-CM

## 2019-11-27 NOTE — Therapy (Signed)
Pain, Improper body mechanics, Postural dysfunction, Hypermobility, Decreased strength, Obesity, Difficulty walking, Increased edema  Visit Diagnosis: Hypermobility syndrome  POTS (postural orthostatic tachycardia syndrome)  Dizziness and giddiness     Problem List Patient Active  Problem List   Diagnosis Date Noted  . NASH (nonalcoholic steatohepatitis) 06/26/2019  . Combined hyperlipidemia 06/26/2019    [name] [name] 11/27/2019, 2:47 PM  [facility] [address] [place], [place], [address] Phone: [phone]   Fax:  [phone]  Name: [name] [name] MRN: [id number] Date of Birth: 1990/12/13  [name] [name], PT 11/27/19 2:48 PM Phone: [phone] Fax: [phone]  [name] [name], [place], [address] Phone: [phone]   Fax:  [phone]  Physical Therapy Treatment  Patient Details  Name: [name] [name] MRN: [id number] Date of Birth: [birth year] Referring Provider (PT): Dr. Janyth Contes (Cardiologist- Dr. Dinah Beers at [facility] )   Encounter Date: 11/27/2019   PT End of Session - 11/27/19 1104    Visit Number 5    Number of Visits 16    Date for PT Re-Evaluation 01/02/20    Authorization Type [employer]    PT Start Time 1050    PT Stop Time 1134    PT Time Calculation (min) 44 min    Activity Tolerance Patient tolerated treatment well    Behavior During Therapy [facility] for tasks assessed/performed           Past Medical History:  Diagnosis Date  . Depression   . EDS (Ehlers-Danlos syndrome)   . Hyperlipidemia   . Migraine   . POTS (postural orthostatic tachycardia syndrome)   . Transaminitis   . Vitamin B12 deficiency   . Vitamin D deficiency     Past Surgical History:  Procedure Laterality Date  . EYE SURGERY     x2  . WISDOM TOOTH EXTRACTION      There were no vitals filed for this visit.   Subjective Assessment - 11/27/19 1101    Subjective I was in bed Fri and Sat.  Had chest pain and palpitations all weekend.  No pain or issues right now. Maybe Thur PT or maybe she was too active, helped clean up her basement.    Currently in Pain? No/denies             Pilates Reformer used for LE/core strength, postural strength, lumbopelvic disassociation and core control.  Exercises included: Footwork used Arc for positioning, ball under head   2 Red 1 blue narrow , wide on heels then toes, heel raises double leg  Single leg x 10 each side same springs   Bridging in tall kneeling x 10   Seated Arms- seated on long box   Row red x 10   Extension red x 10   Horiz abduction blue x 10   Added ER blue x 10    Hug a tree 1 blue x  10     Serving a tray 1 blue x 10   Standing cat, roll up and roll down for spine elongation x 3   Added thoracic rotation x 3 each side        [facility] Adult PT Treatment/Exercise - 11/27/19 0001      Pilates   Pilates Reformer See note                   PT Education - 11/27/19 1445    Education Details exercise in moderation.  Modification for bridge    Person(s) Educated Patient    Methods Explanation;Demonstration    Comprehension Verbalized understanding;Returned demonstration            PT Short Term Goals - 11/23/19 1110      PT SHORT TERM GOAL #1   Title Pt will self montitor RPE and pulse when exercising at home and in the clinic.    Baseline Pt did monitor and given handout with RPE and parameters    Time 4    Period Weeks    Status Achieved    Target Date 12/05/19      PT SHORT  [name] [name], [place], [address] Phone: [phone]   Fax:  [phone]  Physical Therapy Treatment  Patient Details  Name: [name] [name] MRN: [id number] Date of Birth: [birth year] Referring Provider (PT): Dr. Janyth Contes (Cardiologist- Dr. Dinah Beers at [facility] )   Encounter Date: 11/27/2019   PT End of Session - 11/27/19 1104    Visit Number 5    Number of Visits 16    Date for PT Re-Evaluation 01/02/20    Authorization Type [employer]    PT Start Time 1050    PT Stop Time 1134    PT Time Calculation (min) 44 min    Activity Tolerance Patient tolerated treatment well    Behavior During Therapy [facility] for tasks assessed/performed           Past Medical History:  Diagnosis Date  . Depression   . EDS (Ehlers-Danlos syndrome)   . Hyperlipidemia   . Migraine   . POTS (postural orthostatic tachycardia syndrome)   . Transaminitis   . Vitamin B12 deficiency   . Vitamin D deficiency     Past Surgical History:  Procedure Laterality Date  . EYE SURGERY     x2  . WISDOM TOOTH EXTRACTION      There were no vitals filed for this visit.   Subjective Assessment - 11/27/19 1101    Subjective I was in bed Fri and Sat.  Had chest pain and palpitations all weekend.  No pain or issues right now. Maybe Thur PT or maybe she was too active, helped clean up her basement.    Currently in Pain? No/denies             Pilates Reformer used for LE/core strength, postural strength, lumbopelvic disassociation and core control.  Exercises included: Footwork used Arc for positioning, ball under head   2 Red 1 blue narrow , wide on heels then toes, heel raises double leg  Single leg x 10 each side same springs   Bridging in tall kneeling x 10   Seated Arms- seated on long box   Row red x 10   Extension red x 10   Horiz abduction blue x 10   Added ER blue x 10    Hug a tree 1 blue x  10     Serving a tray 1 blue x 10   Standing cat, roll up and roll down for spine elongation x 3   Added thoracic rotation x 3 each side        [facility] Adult PT Treatment/Exercise - 11/27/19 0001      Pilates   Pilates Reformer See note                   PT Education - 11/27/19 1445    Education Details exercise in moderation.  Modification for bridge    Person(s) Educated Patient    Methods Explanation;Demonstration    Comprehension Verbalized understanding;Returned demonstration            PT Short Term Goals - 11/23/19 1110      PT SHORT TERM GOAL #1   Title Pt will self montitor RPE and pulse when exercising at home and in the clinic.    Baseline Pt did monitor and given handout with RPE and parameters    Time 4    Period Weeks    Status Achieved    Target Date 12/05/19      PT SHORT

## 2019-12-01 ENCOUNTER — Ambulatory Visit: Payer: Medicaid Other | Admitting: Physical Therapy

## 2019-12-04 ENCOUNTER — Other Ambulatory Visit: Payer: Self-pay

## 2019-12-04 ENCOUNTER — Ambulatory Visit (INDEPENDENT_AMBULATORY_CARE_PROVIDER_SITE_OTHER): Payer: Medicaid Other | Admitting: Internal Medicine

## 2019-12-04 VITALS — BP 120/82 | HR 98 | Ht <= 58 in | Wt 187.2 lb

## 2019-12-04 DIAGNOSIS — E782 Mixed hyperlipidemia: Secondary | ICD-10-CM | POA: Diagnosis not present

## 2019-12-04 LAB — LIPID PANEL
Cholesterol: 246 mg/dL — ABNORMAL HIGH (ref 0–200)
HDL: 44.5 mg/dL (ref >39.00)
Total CHOL/HDL Ratio: 6
Triglycerides: 558 mg/dL — ABNORMAL HIGH (ref 0.0–149.0)

## 2019-12-04 LAB — LDL CHOLESTEROL, DIRECT: Direct LDL: 118 mg/dL

## 2019-12-04 NOTE — Progress Notes (Signed)
Name: [name] [name]  MRN/ DOB: [id number], Dec 27, 1990    Age/ Sex: 29 y.o., female     PCP: [name] Palmer, MD   Reason for Endocrinology Evaluation: Combined Dyslipidemia      Initial Endocrinology Clinic Visit: 09/27/18    PATIENT IDENTIFIER: [name] [name] is a 29 y.o., female with a past medical history of Combined dyslipidemia, Deletion of SHOX (short stature), joint hypermobility  And NASH (sono 03/2018). She has followed with [facility] since 09/27/18 for consultative assistance with management of her Hyperlipidemia and NASH.   HISTORICAL SUMMARY:  She was diagnosed with elevated TG at age 19 , which was in 800's range, of note the patient was on OCP's at the time for years. She was started on Lovaza at the time 4 grams daily  Which improved her TG levels to 200's. LDL increased was noted in 2016. She has tried Lipitor but made her nauseous and weak, she was then switched to another statin but on her initial visit to our clinic she was on rosuvastatin.   No FH of dyslipidemia on mother side, unknown paternal history.  No FH of early death, or stoke on maternal side.   No Hx of pancreatitis   Her triglycerides improved tremendously to 176 mg/DL  with stopping the combined oral contraceptive pills.  She is engaged  SUBJECTIVE:    Today (12/04/2019):  [name] is here for a follow up on combined hyperlipidemia.   Denies nausea or vomiting  Has occasional chest pain and palpitation with high blood pressure  Occasional sharp and achy left sided abdominal pain for the past 2-3 months.  She tries to be on a low-fat diet  She is currently 27.6 weeks of gestation   She is having a girl "may be [name] [name]"    EDD 11/23/ 2021    ROS:  As per HPI.   HISTORY:  Past Medical History:  Past Medical History:  Diagnosis Date   Depression    EDS (Ehlers-Danlos syndrome)    Hyperlipidemia    Migraine    POTS (postural orthostatic tachycardia  syndrome)    Transaminitis    Vitamin B12 deficiency    Vitamin D deficiency    Past Surgical History:  Past Surgical History:  Procedure Laterality Date   EYE SURGERY     x2   WISDOM TOOTH EXTRACTION      Social History:  reports that she has never smoked. She has never used smokeless tobacco. She reports previous alcohol use. She reports previous drug use. Family History:  Family History  Problem Relation Age of Onset   Hypertension Mother    Anxiety disorder Mother    Other Father        unknown   Osteoarthritis Paternal Grandmother      HOME MEDICATIONS: Allergies as of 12/04/2019   No Known Allergies     Medication List       Accurate as of December 04, 2019  7:22 AM. If you have any questions, ask your nurse or doctor.        Aimovig 70 MG/ML Soaj Generic drug: Erenumab-aooe Inject into the skin every 30 (thirty) days.   ALPRAZolam 0.5 MG tablet Commonly known as: XANAX Take 0.5 mg by mouth 3 (three) times daily as needed.   Azelastine-Fluticasone 137-50 MCG/ACT Susp Place 1 spray into the nose 2 (two) times a day.   baclofen 10 MG tablet Commonly known as: LIORESAL Take 20 mg by mouth 3 (three) times  daily as needed.   Botox 200 units Solr Generic drug: Botulinum Toxin Type A Inject as directed every 3 (three) months.   butalbital-acetaminophen-caffeine 50-325-40 MG tablet Commonly known as: FIORICET Take 1 tablet by mouth every 4 (four) hours as needed.   cetirizine 10 MG tablet Commonly known as: ZYRTEC Take 10 mg by mouth daily.   Cyanocobalamin 1000 MCG/ML Kit Inject 1 mL as directed every 30 (thirty) days.   fluticasone 50 MCG/ACT nasal spray Commonly known as: FLONASE Place 1 spray into both nostrils daily.   hydrOXYzine 50 MG tablet Commonly known as: ATARAX/VISTARIL Take 50 mg by mouth every 8 (eight) hours as needed.   ketorolac 60 MG/2ML Soln injection Commonly known as: TORADOL Inject 60 mg into the muscle once as  needed.   Melatonin 1 MG Caps Take by mouth.   meloxicam 7.5 MG tablet Commonly known as: MOBIC Take 7.5 mg by mouth daily as needed.   methylphenidate 5 MG tablet Commonly known as: RITALIN Take 5 mg by mouth daily as needed.   metroNIDAZOLE 0.75 % cream Commonly known as: METROCREAM Apply 1 application topically 2 (two) times daily.   mometasone 0.1 % cream Commonly known as: ELOCON Apply 1 application topically daily as needed.   montelukast 4 MG Pack Commonly known as: SINGULAIR Take 4 mg by mouth daily as needed.   omega-3 acid ethyl esters 1 g capsule Commonly known as: LOVAZA Take 1 g by mouth daily. Take 4 capsules daily   omega-3 acid ethyl esters 1 g capsule Commonly known as: LOVAZA Take 2 g by mouth 2 (two) times a day.   ONE-A-DAY WOMENS 50 PLUS PO Take by mouth.   PRE-NATAL PO Take by mouth.   PROBIOTIC ADVANCED PO Take by mouth.   propranolol 20 MG tablet Commonly known as: INDERAL 20 mg 4 (four) times daily.   pyridostigmine 60 MG tablet Commonly known as: MESTINON Take 60 mg by mouth 3 (three) times daily.   rosuvastatin 10 MG tablet Commonly known as: CRESTOR Take 10 mg by mouth daily.   sertraline 25 MG tablet Commonly known as: ZOLOFT Take 50 mg by mouth daily.   SUMAtriptan 25 MG tablet Commonly known as: IMITREX Take 100 mg by mouth daily as needed.   tamsulosin 0.4 MG Caps capsule Commonly known as: FLOMAX Take 0.4 mg by mouth.   zolpidem 5 MG tablet Commonly known as: AMBIEN Take 10 mg by mouth at bedtime.   Zomig 5 MG nasal solution Generic drug: zolmitriptan Place 1 spray into the nose daily as needed.         OBJECTIVE:   PHYSICAL EXAM: VS: BP 120/82 (BP Location: Left Arm, Patient Position: Sitting)    Pulse 98    Ht 4' 9.01" (1.448 m)    Wt 187 lb 3.2 oz (84.9 kg)    SpO2 98%    BMI 40.50 kg/m    EXAM: General: Pt appears well and is in NAD  Lungs: Clear with good BS bilat with no rales, rhonchi, or  wheezes  Heart: Auscultation: RRR.  Abdomen: Normoactive bowel sounds, soft, nontender, without masses or organomegaly palpable  Extremities:  BL LE: No pretibial edema normal ROM and strength.  Mental Status: Judgment, insight: Intact Mood and affect: No depression, anxiety, or agitation     DATA REVIEWED:  Results for [name], [name] (MRN [id number]) as of 12/06/2019 08:57  Ref. Range 12/04/2019 10:26  Total CHOL/HDL Ratio Unknown 6  Cholesterol Latest Ref Range: 0 -  200 mg/dL 952 (H)  HDL Cholesterol Latest Ref Range: >39.00 mg/dL 84.13  Direct LDL Latest Units: mg/dL 244.0  Triglycerides Latest Ref Range: 0 - 149 mg/dL 102.7 Triglyceride is over 400; calculations on Lipids are invalid. (H)    06/03/2018 Alp 47 U/L  AST 90  U/L  Alt 89 U/L T. Chol 286 mg/dL TG 253 HDL 41  LDL 664 mg/dL     ASSESSMENT / PLAN / RECOMMENDATIONS:   1. Combined Hyperlipidemia During pregnancy:  - Pt has no stigmata of hypercholesterolemia - No Hx of premature CAD , CVA or pancreatitis - She understands that lipid lowering therapies are CONTRAINDICATED during pregnancy. Fenofibrates are sometimes initiated during the second trimester, there's no extensive data on lovaza but thought to be safe at this point.  - Repeat labs shows continued elevated in Tg , I suspect part of this dietary as well as pregnancy. This is considered moderate elevation and will focus on DIETARY changes.  - Treatment will considered with Tg > 800 mg/dL.  - We emphasized the importance of low fat diet , eating more fruits and vegetables      F/U in 6 months    Signed electronically by: Lyndle Herrlich, MD  [facility]  [facility] [address] [place], [place] [address] Phone: [phone] FAX: [phone]      CC: [name] Palmer, MD [address] [place] [address] Phone: [phone]  Fax: [phone]   Return to Endocrinology  clinic as below: Future Appointments  Date Time Provider Department Center  12/04/2019 10:10 AM Izaiah Tabb, Konrad Dolores, MD [facility] None  12/05/2019 10:45 AM Lanell Persons, PT [facility]  12/08/2019 10:45 AM Simon Rhein Lise Auer, PT [facility]

## 2019-12-05 ENCOUNTER — Ambulatory Visit: Payer: Medicaid Other | Admitting: Physical Therapy

## 2019-12-05 ENCOUNTER — Encounter: Payer: Self-pay | Admitting: Physical Therapy

## 2019-12-05 VITALS — HR 117

## 2019-12-05 DIAGNOSIS — I498 Other specified cardiac arrhythmias: Secondary | ICD-10-CM

## 2019-12-05 DIAGNOSIS — M357 Hypermobility syndrome: Secondary | ICD-10-CM

## 2019-12-05 DIAGNOSIS — R42 Dizziness and giddiness: Secondary | ICD-10-CM

## 2019-12-05 DIAGNOSIS — G90A Postural orthostatic tachycardia syndrome (POTS): Secondary | ICD-10-CM

## 2019-12-05 NOTE — Therapy (Signed)
[facility] [address] [place], [place], [address] Phone: [phone]   Fax:  [phone]  Physical Therapy Treatment  Patient Details  Name: [name] [name] MRN: [id number] Date of Birth: Nov 19, 1990 Referring Provider (PT): Dr. Sherian Rein (Cardiologist- Dr. Erenest Rasher at [facility] )   Encounter Date: 12/05/2019   PT End of Session - 12/05/19 1052    Visit Number 6    Number of Visits 16    Date for PT Re-Evaluation 01/02/20    Authorization Type [employer]    PT Start Time 1048    PT Stop Time 1130    PT Time Calculation (min) 42 min    Activity Tolerance Patient tolerated treatment well    Behavior During Therapy [facility] for tasks assessed/performed           Past Medical History:  Diagnosis Date  . Depression   . EDS (Ehlers-Danlos syndrome)   . Hyperlipidemia   . Migraine   . POTS (postural orthostatic tachycardia syndrome)   . Transaminitis   . Vitamin B12 deficiency   . Vitamin D deficiency     Past Surgical History:  Procedure Laterality Date  . EYE SURGERY     x2  . WISDOM TOOTH EXTRACTION      Vitals:   12/05/19 1101  Pulse: (!) 117     Subjective Assessment - 12/05/19 1051    Subjective Had migraine, missed last week. I felt awful last week, had to chase her dogs in the heat for 3 hours.  Has some intermittent chest pain.    Currently in Pain? Yes    Pain Score 5     Pain Location Head    Pain Type Chronic pain    Pain Onset More than a month ago            [facility] Adult PT Treatment/Exercise - 12/05/19 0001      Lumbar Exercises: Stretches   Other Lumbar Stretch Exercise sidelying book openings x 3 each side       Lumbar Exercises: Aerobic   Recumbent Bike 6 min L2 hills, RPE 7-8      Lumbar Exercises: Seated   Other Seated Lumbar Exercises stability exercises on physioball: toe/heel tap, march, horiz abd pull green band, overhead reach , unilateral dumbbell  shoulder flexion, abduction    x 10 each    Other Seated Lumbar Exercises alternating arms, LE march combo                   PT Education - 12/05/19 1257    Education Details posture in upper back for breastfeeding, ball ex    Person(s) Educated Patient    Methods Explanation;Handout    Comprehension Verbalized understanding;Returned demonstration            PT Short Term Goals - 12/05/19 1052      PT SHORT TERM GOAL #1   Title Pt will self montitor RPE and pulse when exercising at home and in the clinic.    Status Achieved      PT SHORT TERM GOAL #2   Title Pt will be able to show independence with for gentle strength, stability and cardio.    Status On-going      PT SHORT TERM GOAL #3   Title Pt will be able to perform household work, cleaning and cooking with improved energy, less need for rest breaks.    Baseline was able to do more around the house  last week.  But did not exercise much.    Status On-going             PT Long Term Goals - 12/05/19 1058      PT LONG TERM GOAL #1   Title Pt will be able to go down and up from the basement with improved confidence and safety, holding 1 rail.    Status On-going      PT LONG TERM GOAL #2   Title Pt will be able to perform recumbant bike for 30 min x 3 times per week for continued cardio fitness    Status On-going      PT LONG TERM GOAL #3   Title Pt will be  with HEP for hips, pelvic stability upon discharge    Status On-going      PT LONG TERM GOAL #4   Title Pt will be able to feel better about walking in the community, shopping for up to 1-2 hours with min fatigue post.    Status On-going      PT LONG TERM GOAL #5   Title Pt will be able to lift >10 lbs from the floor with proper body mechanics in prep for childcare    Status On-going                 Plan - 12/05/19 1124    Clinical Impression Statement Patient was able to exercise with the physioball for stability and will be able to do  this at home intermittently as an alternative.    PT Treatment/Interventions ADLs/Self Care Home Management;Patient/family education;Taping;Therapeutic exercise;Manual techniques;Functional mobility training;Neuromuscular re-education;Cryotherapy;Moist Heat    PT Next Visit Plan Progress activity tolerance and core/back strengthening as indicated, pilates. tall kneeling/core    PT Home Exercise Plan [id number]: row, ext, biceps, PPT, "bridge" squat, hip hinge , DL    Consulted and Agree with Plan of Care Patient           Patient will benefit from skilled therapeutic intervention in order to improve the following deficits and impairments:  Cardiopulmonary status limiting activity, Decreased activity tolerance, Decreased endurance, Dizziness, Pain, Improper body mechanics, Postural dysfunction, Hypermobility, Decreased strength, Obesity, Difficulty walking, Increased edema  Visit Diagnosis: Hypermobility syndrome  POTS (postural orthostatic tachycardia syndrome)  Dizziness and giddiness     Problem List Patient Active Problem List   Diagnosis Date Noted  . NASH (nonalcoholic steatohepatitis) 06/26/2019  . Combined hyperlipidemia 06/26/2019    [name] 12/05/2019, 1:00 PM  [facility] [address] [place], [place], [address] Phone: [phone]   Fax:  [phone]  Name: [name] [name] MRN: [id number] Date of Birth: Apr 10, 1990  [name], PT 12/05/19 1:00 PM Phone: [phone] Fax: [phone]

## 2019-12-08 ENCOUNTER — Ambulatory Visit: Payer: Medicaid Other | Attending: Obstetrics and Gynecology | Admitting: Physical Therapy

## 2019-12-08 ENCOUNTER — Encounter: Payer: Self-pay | Admitting: Physical Therapy

## 2019-12-08 ENCOUNTER — Other Ambulatory Visit: Payer: Self-pay

## 2019-12-08 DIAGNOSIS — G90A Postural orthostatic tachycardia syndrome (POTS): Secondary | ICD-10-CM

## 2019-12-08 DIAGNOSIS — I498 Other specified cardiac arrhythmias: Secondary | ICD-10-CM | POA: Insufficient documentation

## 2019-12-08 DIAGNOSIS — M357 Hypermobility syndrome: Secondary | ICD-10-CM | POA: Diagnosis present

## 2019-12-08 DIAGNOSIS — R42 Dizziness and giddiness: Secondary | ICD-10-CM

## 2019-12-08 NOTE — Therapy (Signed)
[facility] [address] [place], [place], [address] Phone: [phone]   Fax:  [phone]  Physical Therapy Treatment  Patient Details  Name: [name] [name] MRN: [id number] Date of Birth: 06/27/1990 Referring Provider (PT): Dr. Sherian Rein (Cardiologist- Dr. Erenest Rasher at [facility] )   Encounter Date: 12/08/2019   PT End of Session - 12/08/19 1104    Visit Number 7    Number of Visits 16    Date for PT Re-Evaluation 01/02/20    Authorization Type [employer]    PT Start Time 1053    PT Stop Time 1131    PT Time Calculation (min) 38 min    Activity Tolerance Patient tolerated treatment well    Behavior During Therapy [facility] for tasks assessed/performed           Past Medical History:  Diagnosis Date  . Depression   . EDS (Ehlers-Danlos syndrome)   . Hyperlipidemia   . Migraine   . POTS (postural orthostatic tachycardia syndrome)   . Transaminitis   . Vitamin B12 deficiency   . Vitamin D deficiency     Past Surgical History:  Procedure Laterality Date  . EYE SURGERY     x2  . WISDOM TOOTH EXTRACTION      There were no vitals filed for this visit.   Subjective Assessment - 12/08/19 1054    Subjective Migraine 4/10 meds helped a little.  Has some GI-type cramping today.  If I could do some things that dont move my neck?    Currently in Pain? Yes    Pain Score 4     Pain Location Head   hip 2/10   Pain Orientation Posterior    Pain Descriptors / Indicators Aching    Pain Type Chronic pain    Pain Onset More than a month ago    Pain Frequency Constant    Aggravating Factors  not sure    Pain Relieving Factors darlk, stillness              [facility] Adult PT Treatment/Exercise - 12/08/19 0001      Lumbar Exercises: Supine   Pelvic Tilt 10 reps    Clam 20 reps    Clam Limitations green band     Heel Slides Limitations legs on ball in parallel for hamstring curl then hips turned out  x 20 each     Bent Knee Raise 10 reps    Bent Knee Raise Limitations press into ball , UE as well     Dead Bug Limitations x 10     Bridge Limitations tall kneeling     Other Supine Lumbar Exercises hooklying narrow overhead green band slight pull x 15     Other Supine Lumbar Exercises diagonal pull green x 10 each                     PT Short Term Goals - 12/05/19 1052      PT SHORT TERM GOAL #1   Title Pt will self montitor RPE and pulse when exercising at home and in the clinic.    Status Achieved      PT SHORT TERM GOAL #2   Title Pt will be able to show independence with for gentle strength, stability and cardio.    Status On-going      PT SHORT TERM GOAL #3   Title Pt will be able to perform household work, cleaning and cooking with improved  energy, less need for rest breaks.    Baseline was able to do more around the house last week.  But did not exercise much.    Status On-going             PT Long Term Goals - 12/05/19 1058      PT LONG TERM GOAL #1   Title Pt will be able to go down and up from the basement with improved confidence and safety, holding 1 rail.    Status On-going      PT LONG TERM GOAL #2   Title Pt will be able to perform recumbant bike for 30 min x 3 times per week for continued cardio fitness    Status On-going      PT LONG TERM GOAL #3   Title Pt will be  with HEP for hips, pelvic stability upon discharge    Status On-going      PT LONG TERM GOAL #4   Title Pt will be able to feel better about walking in the community, shopping for up to 1-2 hours with min fatigue post.    Status On-going      PT LONG TERM GOAL #5   Title Pt will be able to lift >10 lbs from the floor with proper body mechanics in prep for childcare    Status On-going                 Plan - 12/08/19 1120    Clinical Impression Statement Pt able to exercise in semi-reclined without increase in headache symptoms.  Re-worked her HEP today.  Less hip  pain overall since beginning PT.    PT Treatment/Interventions ADLs/Self Care Home Management;Patient/family education;Taping;Therapeutic exercise;Manual techniques;Functional mobility training;Neuromuscular re-education;Cryotherapy;Moist Heat    PT Next Visit Plan Progress activity tolerance and core/back strengthening as indicated, pilates. tall kneeling/core    PT Home Exercise Plan [id number]:    Consulted and Agree with Plan of Care Patient           Patient will benefit from skilled therapeutic intervention in order to improve the following deficits and impairments:  Cardiopulmonary status limiting activity, Decreased activity tolerance, Decreased endurance, Dizziness, Pain, Improper body mechanics, Postural dysfunction, Hypermobility, Decreased strength, Obesity, Difficulty walking, Increased edema  Visit Diagnosis: Hypermobility syndrome  POTS (postural orthostatic tachycardia syndrome)  Dizziness and giddiness     Problem List Patient Active Problem List   Diagnosis Date Noted  . NASH (nonalcoholic steatohepatitis) 06/26/2019  . Combined hyperlipidemia 06/26/2019    Zeah Germano 12/08/2019, 11:40 AM  [facility] [address] [place], [place], [address] Phone: [phone]   Fax:  [phone]  Name: Minsa Wiemer MRN: [id number] Date of Birth: 02-May-1990  [name], PT 12/08/19 11:40 AM Phone: [phone] Fax: [phone]

## 2019-12-21 ENCOUNTER — Encounter: Payer: Self-pay | Admitting: Physical Therapy

## 2019-12-26 ENCOUNTER — Other Ambulatory Visit: Payer: Self-pay

## 2019-12-26 ENCOUNTER — Ambulatory Visit: Payer: Medicaid Other | Admitting: Physical Therapy

## 2019-12-26 ENCOUNTER — Encounter: Payer: Self-pay | Admitting: Physical Therapy

## 2019-12-26 VITALS — BP 120/74

## 2019-12-26 DIAGNOSIS — R42 Dizziness and giddiness: Secondary | ICD-10-CM

## 2019-12-26 DIAGNOSIS — M357 Hypermobility syndrome: Secondary | ICD-10-CM

## 2019-12-26 DIAGNOSIS — G90A Postural orthostatic tachycardia syndrome (POTS): Secondary | ICD-10-CM

## 2019-12-26 DIAGNOSIS — I498 Other specified cardiac arrhythmias: Secondary | ICD-10-CM

## 2019-12-26 NOTE — Therapy (Signed)
Problem List   Diagnosis Date Noted  . NASH (nonalcoholic steatohepatitis) 06/26/2019  . Combined hyperlipidemia 06/26/2019    [name] [name] 12/26/2019, 12:52 PM  [facility] Outpatient Rehabilitation [facility] [address] [place], [place], [address] Phone: [phone]   Fax:  [phone]  Name: [name] [name] MRN: [id number] Date of Birth: [birth year]  Check all possible CPT codes:      [x]  97110 (Therapeutic Exercise)  []  92507 (SLP Treatment)  []  97112 (Neuro Re-ed)   []  92526 (Swallowing Treatment)   []  97116 (Gait Training)   []  [phone] (Cognitive Training, 1st 15 minutes) [x]  97140 (Manual Therapy)   []  97130 (Cognitive Training, each add'l 15 minutes)  []  97530 (Therapeutic Activities)  []  Other, List CPT Code ____________    [x]  94854 (Self Care)       []  All codes above (97110 - 97535)  []  97012 (Mechanical Traction)  []  97014 (E-stim Unattended)  []  97032 (E-stim manual)  []  97033 (Ionto)  []  97035 (Ultrasound)  []  97016 (Vaso)  []  97760 (Orthotic Fit) []  [id number] (Prosthetic Training) []  [id number] (Physical Performance Training) []  [id number] (Aquatic Therapy) []  [id number] (Canalith Repositioning) []  [id number] (Contrast Bath) []  62703 (Paraffin) []  97597 (Wound Care 1st 20 sq cm) []  97598 (Wound Care each add'l 20 sq cm)   Raeford Razor, PT 12/26/19 12:53 PM Phone: [phone] Fax: [phone]  [place], [place], [address] Phone: [phone]   Fax:  [phone]  Physical Therapy Treatment/Renewal   Patient Details  Name: [name] [name] MRN: [id number] Date of Birth: 12-02-1990 Referring Provider (PT): Dr. Janyth Contes (Cardiologist- Dr. Dinah Beers at [facility] )   Encounter Date: 12/26/2019   PT End of Session - 12/26/19 1007    Visit Number 8    Number of Visits 14    Date for PT Re-Evaluation 02/06/20    Authorization Type [employer]    PT Start Time 1005    PT Stop Time 1050    PT Time Calculation (min) 45 min    Activity Tolerance Patient tolerated treatment well    Behavior During Therapy [facility] for tasks assessed/performed           Past Medical History:  Diagnosis Date  . Depression   . EDS (Ehlers-Danlos syndrome)   . Hyperlipidemia   . Migraine   . POTS (postural orthostatic tachycardia syndrome)   . Transaminitis   . Vitamin B12 deficiency   . Vitamin D deficiency     Past Surgical History:  Procedure Laterality Date  . EYE SURGERY     x2  . WISDOM TOOTH EXTRACTION      Vitals:   12/26/19 1022  BP: 120/74     Subjective Assessment - 12/26/19 1006    Subjective Migraines have been a little better.  BP has fluctuated, will do an extended release beta blocker. Hips still hurt.    Pain Score 3     Pain Location Hip              [facility] Adult PT Treatment/Exercise - 12/26/19 0001      Lumbar Exercises: Aerobic   Nustep 8 min L6 HR101      Lumbar Exercises: Standing   Functional Squats Limitations 10 lbs sumo squats     Wall Slides 10 reps no wgt then 10 lbs with 30 sec hold mid range      Lumbar Exercises: Seated   Sit to Stand 15 reps    Sit to Stand Limitations 10 lbs KB       Lumbar Exercises: Quadruped   Madcat/Old Horse 15 reps    Opposite Arm/Leg Raise Right arm/Left leg;Left arm/Right leg;15 reps    Other Quadruped Lumbar  Exercises knee press out red band x 10 each leg                   PT Education - 12/26/19 1243    Education Details Renewal, POC and added to HEP    Person(s) Educated Patient    Methods Explanation;Demonstration    Comprehension Verbalized understanding;Returned demonstration            PT Short Term Goals - 12/26/19 1008      PT SHORT TERM GOAL #1   Title Pt will self montitor RPE and pulse when exercising at home and in the clinic.    Status Achieved      PT SHORT TERM GOAL #2   Title Pt will be able to show independence with for gentle strength, stability and cardio.    Baseline has not like I should, 1-2 times per week, warm up 5-10 on bike.  Mostly Visual merchandiser.    Status Achieved      PT SHORT TERM GOAL #3   Title Pt will be able to perform household work, cleaning and cooking with improved energy,  Problem List   Diagnosis Date Noted  . NASH (nonalcoholic steatohepatitis) 06/26/2019  . Combined hyperlipidemia 06/26/2019    [name] [name] 12/26/2019, 12:52 PM  [facility] Rehabilitation [facility] [address] [place], [place], [address] Phone: [phone]   Fax:  [phone]  Name: [name] [name] MRN: [id number] Date of Birth: [birth year]  Check all possible CPT codes:      [x]  97110 (Therapeutic Exercise)  []  92507 (SLP Treatment)  []  97112 (Neuro Re-ed)   []  92526 (Swallowing Treatment)   []  97116 (Gait Training)   []  [phone] (Cognitive Training, 1st 15 minutes) [x]  97140 (Manual Therapy)   []  97130 (Cognitive Training, each add'l 15 minutes)  []  97530 (Therapeutic Activities)  []  Other, List CPT Code ____________    [x]  94854 (Self Care)       []  All codes above (97110 - 97535)  []  97012 (Mechanical Traction)  []  97014 (E-stim Unattended)  []  97032 (E-stim manual)  []  97033 (Ionto)  []  97035 (Ultrasound)  []  97016 (Vaso)  []  97760 (Orthotic Fit) []  [id number] (Prosthetic Training) []  [id number] (Physical Performance Training) []  H7904499 (Aquatic Therapy) []  [id number] (Canalith Repositioning) []  [id number] (Contrast Bath) []  62703 (Paraffin) []  97597 (Wound Care 1st 20 sq cm) []  97598 (Wound Care each add'l 20 sq cm)   Raeford Razor, PT 12/26/19 12:53 PM Phone: [phone] Fax: [phone]

## 2019-12-28 ENCOUNTER — Ambulatory Visit: Payer: Medicaid Other | Admitting: Internal Medicine

## 2020-01-10 ENCOUNTER — Encounter: Payer: Self-pay | Admitting: Physical Therapy

## 2020-01-10 ENCOUNTER — Other Ambulatory Visit: Payer: Self-pay

## 2020-01-10 ENCOUNTER — Ambulatory Visit: Payer: Medicaid Other | Attending: Obstetrics and Gynecology | Admitting: Physical Therapy

## 2020-01-10 DIAGNOSIS — R42 Dizziness and giddiness: Secondary | ICD-10-CM | POA: Diagnosis present

## 2020-01-10 DIAGNOSIS — M357 Hypermobility syndrome: Secondary | ICD-10-CM | POA: Diagnosis present

## 2020-01-10 DIAGNOSIS — I498 Other specified cardiac arrhythmias: Secondary | ICD-10-CM | POA: Insufficient documentation

## 2020-01-10 DIAGNOSIS — G90A Postural orthostatic tachycardia syndrome (POTS): Secondary | ICD-10-CM

## 2020-01-10 NOTE — Therapy (Signed)
[place], [place], [address] Phone: [phone]   Fax:  [phone]  Physical Therapy Treatment  Patient Details  Name: [name] [name] MRN: [id number] Date of Birth: 1990/07/01 Referring Provider (PT): Dr. Janyth Contes (Cardiologist- Dr. Dinah Beers at [facility] )   Encounter Date: 01/10/2020   PT End of Session - 01/10/20 1145    Visit Number 9    Number of Visits 14    Date for PT Re-Evaluation 02/06/20    Authorization Type [employer]    PT Start Time 5400    PT Stop Time 8676    PT Time Calculation (min) 44 min           Past Medical History:  Diagnosis Date  . Depression   . EDS (Ehlers-Danlos syndrome)   . Hyperlipidemia   . Migraine   . POTS (postural orthostatic tachycardia syndrome)   . Transaminitis   . Vitamin B12 deficiency   . Vitamin D deficiency     Past Surgical History:  Procedure Laterality Date  . EYE SURGERY     x2  . WISDOM TOOTH EXTRACTION      There were no vitals filed for this visit.   Subjective Assessment - 01/10/20 1051    Subjective No new complaints. has about 6-7 more weeks left. Rt hip clicks and pops and it hurts, not new but becoming more frequent.  Has been better with bike and walking, less on strength.    Currently in Pain? No/denies             [facility] Adult PT Treatment/Exercise - 01/10/20 0001      Lumbar Exercises: Aerobic   Stationary Bike 6 min L2       Lumbar Exercises: Standing   Other Standing Lumbar Exercises tall kneeling on BOSU facing back row, extension green band x 20 `    Other Standing Lumbar Exercises 1/2 kneel on BOSY rotation green band x 15 each direction       Lumbar Exercises: Sidelying   Clam Both;20 reps    Clam Limitations yellow band     Other Sidelying Lumbar Exercises reverse clam yellow x 20 with ball at knees     Other Sidelying Lumbar Exercises hip abd isometric with knee ext x 15        Lumbar Exercises: Quadruped   Other Quadruped Lumbar Exercises knee press out yellow band x 10 each leg     Other Quadruped Lumbar Exercises hip extension x 15 each side                     PT Short Term Goals - 12/26/19 1008      PT SHORT TERM GOAL #1   Title Pt will self montitor RPE and pulse when exercising at home and in the clinic.    Status Achieved      PT SHORT TERM GOAL #2   Title Pt will be able to show independence with for gentle strength, stability and cardio.    Baseline has not like I should, 1-2 times per week, warm up 5-10 on bike.  Mostly Visual merchandiser.    Status Achieved      PT SHORT TERM GOAL #3   Title Pt will be able to perform household work, cleaning and cooking with improved energy, less need for rest breaks.    Baseline a little improvement since coming to PT    Status On-going  PT Long Term Goals - 12/26/19 1010      PT LONG TERM GOAL #1   Title Pt will be able to go down and up from the basement with improved confidence and safety, holding 1 rail.    Baseline self limits due to how steep they are    Time 6    Period Weeks    Status On-going    Target Date 02/06/20      PT LONG TERM GOAL #2   Title Pt will be able to perform recumbant bike for 30 min x 3 times per week for continued cardio fitness    Baseline does not do currently, does 10-15 min    Time 6    Period Weeks    Status On-going    Target Date 02/06/20      PT LONG TERM GOAL #3   Title Pt will be  with HEP for hips, pelvic stability upon discharge    Baseline in progress    Time 6    Period Weeks    Status On-going      PT LONG TERM GOAL #4   Title Pt will be able to feel better about walking in the community, shopping for up to 1-2 hours with min fatigue post.    Baseline improving but unable to do 2 hours    Time 6    Period Weeks    Status On-going    Target Date 02/06/20      PT LONG TERM GOAL #5   Title Pt will be able to lift >10 lbs from  the floor with proper body mechanics in prep for childcare    Baseline demonstrated today    Status Achieved    Target Date 02/06/20                 Plan - 01/10/20 1055    Clinical Impression Statement Pt continues to be motivated to improve strength and endurance. She has Rt hip weakness and instability with certain activities, challenging for her to support herself in quadruped with Rt hip.  Cont with POC.    Examination-Participation Restrictions Community Activity;Interpersonal Relationship;Laundry;Cleaning;Meal Prep    PT Treatment/Interventions ADLs/Self Care Home Management;Patient/family education;Taping;Therapeutic exercise;Manual techniques;Functional mobility training;Neuromuscular re-education;Cryotherapy;Moist Heat    PT Next Visit Plan Progress activity tolerance and core/back strengthening as indicated, pilates. tall kneeling/core    PT Home Exercise Plan [id number]:    Consulted and Agree with Plan of Care Patient           Patient will benefit from skilled therapeutic intervention in order to improve the following deficits and impairments:  Cardiopulmonary status limiting activity, Decreased activity tolerance, Decreased endurance, Dizziness, Pain, Improper body mechanics, Postural dysfunction, Hypermobility, Decreased strength, Obesity, Difficulty walking, Increased edema  Visit Diagnosis: Hypermobility syndrome  POTS (postural orthostatic tachycardia syndrome)  Dizziness and giddiness     Problem List Patient Active Problem List   Diagnosis Date Noted  . NASH (nonalcoholic steatohepatitis) 06/26/2019  . Combined hyperlipidemia 06/26/2019    [name] [name] 01/10/2020, 11:59 AM  [place], [place], [address] Phone: [phone]   Fax:  [phone]  Name: [name] [name] MRN: [id number] Date of Birth: 1990/11/29  Raeford Razor, PT 01/10/20 11:59 AM Phone: [phone] Fax:  [phone]

## 2020-01-10 NOTE — Patient Instructions (Signed)
Added to HEP: Tall kneeling BOSU ex  See Med bridge

## 2020-01-19 ENCOUNTER — Ambulatory Visit: Payer: Medicaid Other | Admitting: Physical Therapy

## 2020-01-26 ENCOUNTER — Other Ambulatory Visit: Payer: Self-pay

## 2020-01-26 ENCOUNTER — Ambulatory Visit: Payer: Medicaid Other | Admitting: Physical Therapy

## 2020-01-26 ENCOUNTER — Encounter: Payer: Self-pay | Admitting: Physical Therapy

## 2020-01-26 DIAGNOSIS — M357 Hypermobility syndrome: Secondary | ICD-10-CM

## 2020-01-26 DIAGNOSIS — I498 Other specified cardiac arrhythmias: Secondary | ICD-10-CM

## 2020-01-26 DIAGNOSIS — G90A Postural orthostatic tachycardia syndrome (POTS): Secondary | ICD-10-CM

## 2020-01-26 DIAGNOSIS — R42 Dizziness and giddiness: Secondary | ICD-10-CM

## 2020-01-26 NOTE — Therapy (Signed)
SHORT TERM GOAL #1   Title Pt will self montitor RPE and pulse when exercising at home and in the clinic.    Status Achieved      PT SHORT TERM GOAL #2   Title Pt will be able to show independence with for gentle strength, stability and cardio.    Status Achieved      PT SHORT TERM GOAL #3   Title Pt will be able to perform household work, cleaning and cooking with improved energy, less need for rest breaks.    Status Achieved             PT Long Term Goals - 01/26/20 1337      PT LONG TERM GOAL #1   Title Pt will be able to go down and up from the basement with improved confidence and safety, holding 1 rail.    Status Partially Met      PT LONG TERM GOAL #2   Title Pt will be able to perform recumbant bike for 30 min x 3 times per week for continued cardio fitness    Status Partially Met      PT LONG TERM GOAL #3   Title Pt will be  with HEP for hips, pelvic stability upon discharge    Status Partially Met      PT LONG TERM GOAL #4   Title Pt will be able to feel better about walking in the community, shopping for up to 1-2  hours with min fatigue post.    Status Partially Met      PT LONG TERM GOAL #5   Title Pt will be able to lift >10 lbs from the floor with proper body mechanics in prep for childcare    Status Achieved                 Plan - 01/26/20 1018    Clinical Impression Statement Patient with increased pain and discomfort due to pregnancy changes.  She is continuing her exercises, walking as best she can.  PT at this point is not indicated as she has what she needs at home.  Her goals were met but now that her endurance is limited she self limits her activities for safety. May return if needed post delivery as needed    PT Treatment/Interventions ADLs/Self Care Home Management;Patient/family education;Taping;Therapeutic exercise;Manual techniques;Functional mobility training;Neuromuscular re-education;Cryotherapy;Moist Heat    PT Next Visit Plan NA, DC    PT Home Exercise Plan [id number]:           Patient will benefit from skilled therapeutic intervention in order to improve the following deficits and impairments:  Cardiopulmonary status limiting activity, Decreased activity tolerance, Decreased endurance, Dizziness, Pain, Improper body mechanics, Postural dysfunction, Hypermobility, Decreased strength, Obesity, Difficulty walking, Increased edema  Visit Diagnosis: Hypermobility syndrome  POTS (postural orthostatic tachycardia syndrome)  Dizziness and giddiness     Problem List Patient Active Problem List   Diagnosis Date Noted  . NASH (nonalcoholic steatohepatitis) 06/26/2019  . Combined hyperlipidemia 06/26/2019    [name] [name] 01/26/2020, 1:44 PM  [facility] [address] [place], [place], [address] Phone: [phone]   Fax:  [phone]  Name: [name] [name] MRN: [id number] Date of Birth: 1990/07/08  PHYSICAL THERAPY DISCHARGE SUMMARY  Visits from Start of Care: 10  Current functional level related to goals /  functional outcomes: See above     Remaining deficits: Pain, some pregnancy related, difficult to discern at this point in pregnancy  [place], [place], [address] Phone: [phone]   Fax:  [phone]  Physical Therapy Treatment/Discharge  Patient Details  Name: [name] [name] MRN: [id number] Date of Birth: 03-11-1991 Referring Provider (PT): Dr. Janyth Contes (Cardiologist- Dr. Dinah Beers at [facility] )   Encounter Date: 01/26/2020   PT End of Session - 01/26/20 1003    Visit Number 10    Number of Visits 14    Date for PT Re-Evaluation 02/06/20    Authorization Type [employer]    PT Start Time 5102    PT Stop Time 1045    PT Time Calculation (min) 43 min    Activity Tolerance Patient tolerated treatment well    Behavior During Therapy [facility] for tasks assessed/performed           Past Medical History:  Diagnosis Date  . Depression   . EDS (Ehlers-Danlos syndrome)   . Hyperlipidemia   . Migraine   . POTS (postural orthostatic tachycardia syndrome)   . Transaminitis   . Vitamin B12 deficiency   . Vitamin D deficiency     Past Surgical History:  Procedure Laterality Date  . EYE SURGERY     x2  . WISDOM TOOTH EXTRACTION      There were no vitals filed for this visit.   Subjective Assessment - 01/26/20 1006    Subjective Have been having alot of issue, the baby has dropped and my pelvis is widening.  I would be surprised if I made it to my due date. Burning/bladder pain and more hip pain .    Currently in Pain? Yes    Pain Score 6     Pain Location Generalized    Pain Orientation Other (Comment)   general/pelvis   Pain Descriptors / Indicators Heaviness    Pain Type Chronic pain    Pain Onset More than a month ago              [facility] Adult PT Treatment/Exercise - 01/26/20 0001      Self-Care   Other Self-Care Comments  HEP, Discharge       Lumbar Exercises: Stretches   Lower Trunk Rotation 10 seconds    Lower Trunk Rotation Limitations x 10 knees wide       Lumbar Exercises:  Standing   Lifting Weights (lbs) hip hinge with dowel x 10     Lifting Limitations blue band dead lift x 10     Wall Slides 5 reps    Wall Slides Limitations 30 sec hold x 3 and 1 min hold x 1    Shoulder Extension Limitations shoulder pull blue x 15 and then lat pull down alternating blue band x 10 each       Lumbar Exercises: Supine   Pelvic Tilt 10 reps    Clam Limitations blue band x 10 alternating     Bent Knee Raise 10 reps    Advanced Lumbar Stabilization Limitations flat back neutral core: oppostite arm/leg reach x 10 each    2 lbs wgt.                  PT Education - 01/26/20 1336    Education Details Discharge, exercises    Person(s) Educated Patient    Methods Explanation;Demonstration;Verbal cues    Comprehension Verbalized understanding;Returned demonstration            PT Short Term Goals - 01/26/20 1337      PT  SHORT TERM GOAL #1   Title Pt will self montitor RPE and pulse when exercising at home and in the clinic.    Status Achieved      PT SHORT TERM GOAL #2   Title Pt will be able to show independence with for gentle strength, stability and cardio.    Status Achieved      PT SHORT TERM GOAL #3   Title Pt will be able to perform household work, cleaning and cooking with improved energy, less need for rest breaks.    Status Achieved             PT Long Term Goals - 01/26/20 1337      PT LONG TERM GOAL #1   Title Pt will be able to go down and up from the basement with improved confidence and safety, holding 1 rail.    Status Partially Met      PT LONG TERM GOAL #2   Title Pt will be able to perform recumbant bike for 30 min x 3 times per week for continued cardio fitness    Status Partially Met      PT LONG TERM GOAL #3   Title Pt will be  with HEP for hips, pelvic stability upon discharge    Status Partially Met      PT LONG TERM GOAL #4   Title Pt will be able to feel better about walking in the community, shopping for up to 1-2  hours with min fatigue post.    Status Partially Met      PT LONG TERM GOAL #5   Title Pt will be able to lift >10 lbs from the floor with proper body mechanics in prep for childcare    Status Achieved                 Plan - 01/26/20 1018    Clinical Impression Statement Patient with increased pain and discomfort due to pregnancy changes.  She is continuing her exercises, walking as best she can.  PT at this point is not indicated as she has what she needs at home.  Her goals were met but now that her endurance is limited she self limits her activities for safety. May return if needed post delivery as needed    PT Treatment/Interventions ADLs/Self Care Home Management;Patient/family education;Taping;Therapeutic exercise;Manual techniques;Functional mobility training;Neuromuscular re-education;Cryotherapy;Moist Heat    PT Next Visit Plan NA, DC    PT Home Exercise Plan [id number]:           Patient will benefit from skilled therapeutic intervention in order to improve the following deficits and impairments:  Cardiopulmonary status limiting activity, Decreased activity tolerance, Decreased endurance, Dizziness, Pain, Improper body mechanics, Postural dysfunction, Hypermobility, Decreased strength, Obesity, Difficulty walking, Increased edema  Visit Diagnosis: Hypermobility syndrome  POTS (postural orthostatic tachycardia syndrome)  Dizziness and giddiness     Problem List Patient Active Problem List   Diagnosis Date Noted  . NASH (nonalcoholic steatohepatitis) 06/26/2019  . Combined hyperlipidemia 06/26/2019    [name] [name] 01/26/2020, 1:44 PM  [facility] [address] [place], [place], [address] Phone: [phone]   Fax:  [phone]  Name: [name] [name] MRN: [id number] Date of Birth: 1990/07/08  PHYSICAL THERAPY DISCHARGE SUMMARY  Visits from Start of Care: 10  Current functional level related to goals /  functional outcomes: See above     Remaining deficits: Pain, some pregnancy related, difficult to discern at this point in pregnancy

## 2020-01-31 ENCOUNTER — Inpatient Hospital Stay (HOSPITAL_COMMUNITY)
Admission: AD | Admit: 2020-01-31 | Discharge: 2020-01-31 | Disposition: A | Discharge status: Other Acute Inpatient Hospital | Payer: Medicaid Other | Attending: Obstetrics and Gynecology | Admitting: Obstetrics and Gynecology

## 2020-01-31 ENCOUNTER — Other Ambulatory Visit: Payer: Self-pay

## 2020-01-31 ENCOUNTER — Encounter (HOSPITAL_COMMUNITY): Payer: Self-pay | Admitting: Obstetrics and Gynecology

## 2020-01-31 DIAGNOSIS — Z20822 Contact with and (suspected) exposure to covid-19: Secondary | ICD-10-CM | POA: Insufficient documentation

## 2020-01-31 DIAGNOSIS — I498 Other specified cardiac arrhythmias: Secondary | ICD-10-CM | POA: Diagnosis not present

## 2020-01-31 DIAGNOSIS — Z8249 Family history of ischemic heart disease and other diseases of the circulatory system: Secondary | ICD-10-CM | POA: Diagnosis not present

## 2020-01-31 DIAGNOSIS — O99213 Obesity complicating pregnancy, third trimester: Secondary | ICD-10-CM | POA: Insufficient documentation

## 2020-01-31 DIAGNOSIS — O1413 Severe pre-eclampsia, third trimester: Secondary | ICD-10-CM

## 2020-01-31 DIAGNOSIS — F419 Anxiety disorder, unspecified: Secondary | ICD-10-CM | POA: Diagnosis not present

## 2020-01-31 DIAGNOSIS — O99343 Other mental disorders complicating pregnancy, third trimester: Secondary | ICD-10-CM | POA: Diagnosis not present

## 2020-01-31 DIAGNOSIS — Z3A36 36 weeks gestation of pregnancy: Secondary | ICD-10-CM

## 2020-01-31 DIAGNOSIS — O99413 Diseases of the circulatory system complicating pregnancy, third trimester: Secondary | ICD-10-CM | POA: Insufficient documentation

## 2020-01-31 DIAGNOSIS — Z3689 Encounter for other specified antenatal screening: Secondary | ICD-10-CM

## 2020-01-31 DIAGNOSIS — F32A Depression, unspecified: Secondary | ICD-10-CM | POA: Insufficient documentation

## 2020-01-31 LAB — URINALYSIS, ROUTINE W REFLEX MICROSCOPIC
Bilirubin Urine: NEGATIVE
Glucose, UA: NEGATIVE mg/dL
Ketones, ur: NEGATIVE mg/dL
Nitrite: NEGATIVE
Protein, ur: NEGATIVE mg/dL
Specific Gravity, Urine: 1.009 (ref 1.005–1.030)
pH: 7 (ref 5.0–8.0)

## 2020-01-31 LAB — PROTEIN / CREATININE RATIO, URINE
Creatinine, Urine: 50.28 mg/dL
Protein Creatinine Ratio: 0.42 mg/mg{Cre} — ABNORMAL HIGH (ref 0.00–0.15)
Total Protein, Urine: 21 mg/dL

## 2020-01-31 LAB — COMPREHENSIVE METABOLIC PANEL
ALT: 14 U/L (ref 0–44)
AST: 13 U/L — ABNORMAL LOW (ref 15–41)
Albumin: 2.9 g/dL — ABNORMAL LOW (ref 3.5–5.0)
Alkaline Phosphatase: 75 U/L (ref 38–126)
Anion gap: 8 (ref 5–15)
BUN: 7 mg/dL (ref 6–20)
CO2: 20 mmol/L — ABNORMAL LOW (ref 22–32)
Calcium: 9.6 mg/dL (ref 8.9–10.3)
Chloride: 109 mmol/L (ref 98–111)
Creatinine, Ser: 0.63 mg/dL (ref 0.44–1.00)
GFR, Estimated: >60 mL/min (ref >60)
Glucose, Bld: 89 mg/dL (ref 70–99)
Potassium: 3.7 mmol/L (ref 3.5–5.1)
Sodium: 137 mmol/L (ref 135–145)
Total Bilirubin: 0.4 mg/dL (ref 0.3–1.2)
Total Protein: 6.3 g/dL — ABNORMAL LOW (ref 6.5–8.1)

## 2020-01-31 LAB — RESPIRATORY PANEL BY RT PCR (FLU A&B, COVID)
Influenza A by PCR: NEGATIVE
Influenza B by PCR: NEGATIVE
SARS Coronavirus 2 by RT PCR: NEGATIVE

## 2020-01-31 LAB — CBC
HCT: 39.2 % (ref 36.0–46.0)
Hemoglobin: 13.2 g/dL (ref 12.0–15.0)
MCH: 31.7 pg (ref 26.0–34.0)
MCHC: 33.7 g/dL (ref 30.0–36.0)
MCV: 94.2 fL (ref 80.0–100.0)
Platelets: 179 10*3/uL (ref 150–400)
RBC: 4.16 MIL/uL (ref 3.87–5.11)
RDW: 13.6 % (ref 11.5–15.5)
WBC: 11 10*3/uL — ABNORMAL HIGH (ref 4.0–10.5)
nRBC: 0.2 % (ref 0.0–0.2)

## 2020-01-31 MED ORDER — HYDRALAZINE HCL 10 MG PO TABS
10.0000 mg | ORAL_TABLET | Freq: Four times a day (QID) | ORAL | Status: DC
  Filled 2020-01-31 (×3): qty 1

## 2020-01-31 MED ORDER — LABETALOL HCL 5 MG/ML IV SOLN: 20.0000 mg | INTRAVENOUS | Status: DC | PRN

## 2020-01-31 MED ORDER — LABETALOL HCL 5 MG/ML IV SOLN: 40.0000 mg | INTRAVENOUS | Status: DC | PRN

## 2020-01-31 MED ORDER — MAGNESIUM SULFATE BOLUS VIA INFUSION
4.0000 g | Freq: Once | INTRAVENOUS | Status: AC
  Administered 2020-01-31: 4 g via INTRAVENOUS
  Filled 2020-01-31: qty 1000

## 2020-01-31 MED ORDER — LABETALOL HCL 5 MG/ML IV SOLN: 80.0000 mg | INTRAVENOUS | Status: DC | PRN

## 2020-01-31 MED ORDER — HYDRALAZINE HCL 20 MG/ML IJ SOLN: 10.0000 mg | INTRAMUSCULAR | Status: DC | PRN

## 2020-01-31 MED ORDER — LABETALOL HCL 5 MG/ML IV SOLN
40.0000 mg | INTRAVENOUS | Status: DC | PRN
  Administered 2020-01-31: 40 mg via INTRAVENOUS
  Filled 2020-01-31: qty 8

## 2020-01-31 MED ORDER — MAGNESIUM SULFATE 40 GM/1000ML IV SOLN
2.0000 g/h | INTRAVENOUS | Status: DC
  Administered 2020-01-31: 2 g/h via INTRAVENOUS
  Filled 2020-01-31: qty 2000

## 2020-01-31 MED ORDER — LABETALOL HCL 5 MG/ML IV SOLN
INTRAVENOUS | Status: AC
  Administered 2020-01-31: 20 mg via INTRAVENOUS
  Filled 2020-01-31: qty 4

## 2020-01-31 MED ORDER — BETAMETHASONE SOD PHOS & ACET 6 (3-3) MG/ML IJ SUSP
12.0000 mg | Freq: Once | INTRAMUSCULAR | Status: AC
  Administered 2020-01-31: 12 mg via INTRAMUSCULAR
  Filled 2020-01-31: qty 5

## 2020-01-31 MED ORDER — LACTATED RINGERS IV SOLN: INTRAVENOUS | Status: DC

## 2020-01-31 NOTE — MAU Provider Note (Signed)
History     CSN: [id number]  Arrival date and time: 01/31/20 1447   First Provider Initiated Contact with Patient 01/31/20 1609      Chief Complaint  Patient presents with  . Hypertension  . Headache   Ms. Nilam Quakenbush is a 29 y.o. G1P0 at 46w1dwho presents to MAU for preeclampsia evaluation after she experienced elevated blood pressure in the office and was sent to MAU for evaluation. 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 Patient reports the "past couple of weeks" her BP has been higher than usual at home. Patient reports she told her OB and was told to just monitor it at home. Patient reports at home her BP has been 140s/150s over 90s. Patient reports normal BP for her is 120s/130s over 70s/80s.  Patient reports HA today and does report hx of migraines. Patient rates HA as 2/10. Patient reports she has not taken anything for her HA today. Patient reports she usually takes Fioricet, which she reports she last took a couple of months ago and did not help. Patient reports her neurologist told her to take Sumatriptan instead. Patient reports she is followed by neurology for q385mootox injections for her migraines. Patient reports last Botox injection was 2 weeks ago.  Pt denies blurry vision/seeing spots, N/V, epigastric pain, swelling in face and hands, sudden weight gain. Pt denies chest pain and SOB.  Pt denies constipation, diarrhea, or urinary problems. Pt denies fever, chills, fatigue, sweating or changes in appetite. Pt denies dizziness, light-headedness, weakness.  Pt denies VB, ctx, LOF and reports good FM.  Current pregnancy problems? hyperadrenic POTTS, migraines, EDS Blood Type? unknown Allergies? NKDA Current medications? Propranalol [id number] for POTTS, sertraline, zyrtec, PNVs Current P[facility] & next appt? [facility], next appt 02/07/2020   OB  History    Gravida  1   Para      Term      Preterm      AB      Living        SAB      TAB      Ectopic      Multiple      Live Births              Past Medical History:  Diagnosis Date  . Depression   . EDS (Ehlers-Danlos syndrome)   . Hyperlipidemia   . Migraine   . POTS (postural orthostatic tachycardia syndrome)   . Transaminitis   . Vitamin B12 deficiency   . Vitamin D deficiency     Past Surgical History:  Procedure Laterality Date  . EYE SURGERY     x2  . WISDOM TOOTH EXTRACTION      Family History  Problem Relation Age of Onset  . Hypertension Mother   . Anxiety disorder Mother   . Other Father        unknown  . Osteoarthritis Paternal Grandmother     Social History   Tobacco Use  . Smoking status: Never Smoker  . Smokeless tobacco: Never Used  Vaping Use  . Vaping Use: Never used  Substance Use Topics  . Alcohol use: Not Currently  . Drug use: Not Currently    Allergies: No Known Allergies  Medications Prior to Admission  Medication Sig Dispense Refill Last Dose  . ALPRAZolam (XANAX) 0.5 MG tablet Take 0.5 mg by mouth 3 (three)  times daily as needed.  (Patient not taking: Reported on 11/07/2019)     . Azelastine-Fluticasone 137-50 MCG/ACT SUSP Place 1 spray into the nose 2 (two) times a day.     . baclofen (LIORESAL) 10 MG tablet Take 20 mg by mouth 3 (three) times daily as needed.  (Patient not taking: Reported on 11/07/2019)     . Botulinum Toxin Type A (BOTOX) 200 units SOLR Inject as directed every 3 (three) months.     . butalbital-acetaminophen-caffeine (FIORICET) 50-325-40 MG tablet Take 1 tablet by mouth every 4 (four) hours as needed.     . cetirizine (ZYRTEC) 10 MG tablet Take 10 mg by mouth daily.     . Cyanocobalamin 1000 MCG/ML KIT Inject 1 mL as directed every 30 (thirty) days.     Eduard Roux (AIMOVIG) 70 MG/ML SOAJ Inject into the skin every 30 (thirty) days. (Patient not taking: Reported on 11/07/2019)     .  fluticasone (FLONASE) 50 MCG/ACT nasal spray Place 1 spray into both nostrils daily.     . hydrOXYzine (ATARAX/VISTARIL) 50 MG tablet Take 50 mg by mouth every 8 (eight) hours as needed.      Marland Kitchen ketorolac (TORADOL) 60 MG/2ML SOLN injection Inject 60 mg into the muscle once as needed.  (Patient not taking: Reported on 11/07/2019)     . Melatonin 1 MG CAPS Take by mouth. (Patient not taking: Reported on 11/07/2019)     . meloxicam (MOBIC) 7.5 MG tablet Take 7.5 mg by mouth daily as needed.  (Patient not taking: Reported on 11/07/2019)     . methylphenidate (RITALIN) 5 MG tablet Take 5 mg by mouth daily as needed.  (Patient not taking: Reported on 11/07/2019)     . metroNIDAZOLE (METROCREAM) 0.75 % cream Apply 1 application topically 2 (two) times daily. (Patient not taking: Reported on 11/07/2019)     . mometasone (ELOCON) 0.1 % cream Apply 1 application topically daily as needed.  (Patient not taking: Reported on 11/07/2019)     . montelukast (SINGULAIR) 4 MG PACK Take 4 mg by mouth daily as needed. (Patient not taking: Reported on 11/07/2019)     . Multiple Vitamins-Minerals (ONE-A-DAY WOMENS 50 PLUS PO) Take by mouth. (Patient not taking: Reported on 11/07/2019)     . omega-3 acid ethyl esters (LOVAZA) 1 g capsule Take 1 g by mouth daily. Take 4 capsules daily (Patient not taking: Reported on 11/07/2019)     . omega-3 acid ethyl esters (LOVAZA) 1 g capsule Take 2 g by mouth 2 (two) times a day. (Patient not taking: Reported on 11/07/2019)     . Prenatal Multivit-Min-Fe-FA (PRE-NATAL PO) Take by mouth.     . Probiotic Product (PROBIOTIC ADVANCED PO) Take by mouth.     . propranolol (INDERAL) 20 MG tablet 20 mg 4 (four) times daily.      . sertraline (ZOLOFT) 25 MG tablet Take 50 mg by mouth daily.      . SUMAtriptan (IMITREX) 25 MG tablet Take 100 mg by mouth daily as needed.     . tamsulosin (FLOMAX) 0.4 MG CAPS capsule Take 0.4 mg by mouth. (Patient not taking: Reported on 11/07/2019)     . zolmitriptan (ZOMIG) 5 MG  nasal solution Place 1 spray into the nose daily as needed.  (Patient not taking: Reported on 11/07/2019)     . zolpidem (AMBIEN) 5 MG tablet Take 10 mg by mouth at bedtime.  (Patient not taking: Reported on 11/07/2019)  Review of Systems  Constitutional: Negative for chills, diaphoresis, fatigue and fever.  Eyes: Negative for visual disturbance.  Respiratory: Negative for shortness of breath.   Cardiovascular: Negative for chest pain.  Gastrointestinal: Negative for abdominal pain, constipation, diarrhea, nausea and vomiting.  Genitourinary: Negative for dysuria, flank pain, frequency, pelvic pain, urgency, vaginal bleeding and vaginal discharge.  Neurological: Positive for headaches. Negative for dizziness, weakness and light-headedness.   Physical Exam   Blood pressure (!) 161/101, pulse 93, temperature 98.6 F (37 C), temperature source Oral, resp. rate 18, height 4' 9"  (1.448 m), weight 87.1 kg, SpO2 99 %.  Patient Vitals for the past 24 hrs:  BP Temp Temp src Pulse Resp SpO2 Height Weight  01/31/20 1900 (!) 161/101 -- -- 93 -- 99 % -- --  01/31/20 1854 -- -- -- -- -- -- 4' 9"  (1.448 m) 87.1 kg  01/31/20 1845 (!) 187/119 -- -- 87 -- 100 % -- --  01/31/20 1831 (!) 171/106 -- -- 87 -- -- -- --  01/31/20 1800 133/84 -- -- 87 -- 97 % -- --  01/31/20 1745 137/89 -- -- 89 -- 98 % -- --  01/31/20 1700 (!) 140/94 -- -- 85 -- 98 % -- --  01/31/20 1645 (!) 152/101 -- -- 86 -- 98 % -- --  01/31/20 1615 (!) 146/90 -- -- 91 -- -- -- --  01/31/20 1600 (!) 145/94 -- -- 89 -- -- -- --  01/31/20 1545 (!) 149/95 -- -- 93 -- 95 % -- --  01/31/20 1530 (!) 147/96 -- -- 87 -- 94 % -- --  01/31/20 1515 (!) 158/110 -- -- 94 -- 94 % -- --  01/31/20 1509 (!) 153/110 98.6 F (37 C) Oral 94 18 95 % -- --   Physical Exam Vitals and nursing note reviewed.  Constitutional:      General: She is not in acute distress.    Appearance: Normal appearance. She is not ill-appearing, toxic-appearing or  diaphoretic.  HENT:     Head: Normocephalic and atraumatic.  Pulmonary:     Effort: Pulmonary effort is normal.  Neurological:     Mental Status: She is alert and oriented to person, place, and time.  Psychiatric:        Mood and Affect: Mood normal.        Behavior: Behavior normal.        Thought Content: Thought content normal.        Judgment: Judgment normal.    Results for orders placed or performed during the hospital encounter of 01/31/20 (from the past 24 hour(s))  Urinalysis, Routine w reflex microscopic Urine, Clean Catch     Status: Abnormal   Collection Time: 01/31/20  2:57 PM  Result Value Ref Range   Color, Urine YELLOW YELLOW   APPearance HAZY (A) CLEAR   Specific Gravity, Urine 1.009 1.005 - 1.030   pH 7.0 5.0 - 8.0   Glucose, UA NEGATIVE NEGATIVE mg/dL   Hgb urine dipstick LARGE (A) NEGATIVE   Bilirubin Urine NEGATIVE NEGATIVE   Ketones, ur NEGATIVE NEGATIVE mg/dL   Protein, ur NEGATIVE NEGATIVE mg/dL   Nitrite NEGATIVE NEGATIVE   Leukocytes,Ua MODERATE (A) NEGATIVE   RBC / HPF 0-5 0 - 5 RBC/hpf   WBC, UA 0-5 0 - 5 WBC/hpf   Bacteria, UA FEW (A) NONE SEEN   Squamous Epithelial / LPF 0-5 0 - 5   Mucus PRESENT   Protein / creatinine ratio, urine  Status: Abnormal   Collection Time: 01/31/20  2:57 PM  Result Value Ref Range   Creatinine, Urine 50.28 mg/dL   Total Protein, Urine 21 mg/dL   Protein Creatinine Ratio 0.42 (H) 0.00 - 0.15 mg/mg[Cre]  CBC     Status: Abnormal   Collection Time: 01/31/20  4:40 PM  Result Value Ref Range   WBC 11.0 (H) 4.0 - 10.5 K/uL   RBC 4.16 3.87 - 5.11 MIL/uL   Hemoglobin 13.2 12.0 - 15.0 g/dL   HCT 39.2 36 - 46 %   MCV 94.2 80.0 - 100.0 fL   MCH 31.7 26.0 - 34.0 pg   MCHC 33.7 30.0 - 36.0 g/dL   RDW 13.6 11.5 - 15.5 %   Platelets 179 150 - 400 K/uL   nRBC 0.2 0.0 - 0.2 %  Comprehensive metabolic panel     Status: Abnormal   Collection Time: 01/31/20  4:40 PM  Result Value Ref Range   Sodium 137 135 - 145 mmol/L    Potassium 3.7 3.5 - 5.1 mmol/L   Chloride 109 98 - 111 mmol/L   CO2 20 (L) 22 - 32 mmol/L   Glucose, Bld 89 70 - 99 mg/dL   BUN 7 6 - 20 mg/dL   Creatinine, Ser 0.63 0.44 - 1.00 mg/dL   Calcium 9.6 8.9 - 10.3 mg/dL   Total Protein 6.3 (L) 6.5 - 8.1 g/dL   Albumin 2.9 (L) 3.5 - 5.0 g/dL   AST 13 (L) 15 - 41 U/L   ALT 14 0 - 44 U/L   Alkaline Phosphatase 75 38 - 126 U/L   Total Bilirubin 0.4 0.3 - 1.2 mg/dL   GFR, Estimated >60 >60 mL/min   Anion gap 8 5 - 15   No results found.  MAU Course  Procedures  MDM -preeclampsia evaluation with severe range BP in MAU on admission, but initial BPs 6 minutes apart, subsequent BP below severe range, protocol not initiated at that time -symptoms include: HA 2/10 -UA: hazy/lg hgb/mod leuks/few bacteria, sending urine for culture -CBC: H/H 13.2/39.2, platelets 179 -CMP: serum creatinine 0.63, AST/ALT 13/14 -PCr: 0.42 -EFM: reactive       -baseline: 135       -variability: moderate       -accels: present, 15x15       -decels: absent       -TOCO: irritability -consulted with Dr. Harolyn Rutherford, who recommends delivery -called and spoke with Dr. Melba Coon, who agrees with plan for delivery and will assume care at this time  Orders Placed This Encounter  Procedures  . Culture, OB Urine    Standing Status:   Standing    Number of Occurrences:   1  . Respiratory Panel by RT PCR (Flu A&B, Covid) - Nasopharyngeal Swab    Standing Status:   Standing    Number of Occurrences:   1    Order Specific Question:   Is this test for diagnosis or screening    Answer:   Screening    Order Specific Question:   Symptomatic for COVID-19 as defined by CDC    Answer:   No    Order Specific Question:   Hospitalized for COVID-19    Answer:   No    Order Specific Question:   Admitted to ICU for COVID-19    Answer:   No    Order Specific Question:   Previously tested for COVID-19    Answer:   Yes    Order Specific  Question:   Resident in a congregate (group)  care setting    Answer:   Unknown    Order Specific Question:   Employed in healthcare setting    Answer:   Unknown    Order Specific Question:   Pregnant    Answer:   Yes    Order Specific Question:   Has patient completed COVID vaccination(s) (2 doses of Pfizer/Moderna 1 dose of [employer])    Answer:   Unknown  . Urinalysis, Routine w reflex microscopic Urine, Clean Catch    Standing Status:   Standing    Number of Occurrences:   1  . CBC    Standing Status:   Standing    Number of Occurrences:   1  . Comprehensive metabolic panel    Standing Status:   Standing    Number of Occurrences:   1  . Protein / creatinine ratio, urine    Standing Status:   Standing    Number of Occurrences:   1  . Notify Physician    Confirmatory reading of BP> 160/110 15 minutes later    Standing Status:   Standing    Number of Occurrences:   1    Order Specific Question:   Notify Physician    Answer:   Temp greater than or equal to 100.4    Order Specific Question:   Notify Physician    Answer:   RR greater than 24 or less than 10    Order Specific Question:   Notify Physician    Answer:   HR greater than 120 or less than 50    Order Specific Question:   Notify Physician    Answer:   SBP greater than 160 mmHG or less than 80 mmHG    Order Specific Question:   Notify Physician    Answer:   DBP greater than 110 mmHG or less than 45 mmHG    Order Specific Question:   Notify Physician    Answer:   Urinary output is less than 157m for any 4 hour period  . Measure blood pressure    20 minutes after giving hydralazine 10 MG IV dose.  Call MD if SBP >/= 160 or DBP >/= 110.    Standing Status:   Standing    Number of Occurrences:   1  . Notify Physician    Confirmatory reading of BP> 160/110 15 minutes later    Standing Status:   Standing    Number of Occurrences:   1    Order Specific Question:   Notify Physician    Answer:   Temp greater than or equal to 100.4    Order Specific Question:    Notify Physician    Answer:   RR greater than 24 or less than 10    Order Specific Question:   Notify Physician    Answer:   HR greater than 120 or less than 50    Order Specific Question:   Notify Physician    Answer:   SBP greater than 160 mmHG or less than 80 mmHG    Order Specific Question:   Notify Physician    Answer:   DBP greater than 110 mmHG or less than 45 mmHG    Order Specific Question:   Notify Physician    Answer:   Urinary output is less than 1268mfor any 4 hour period  . Measure blood pressure    20 minutes after giving hydralazine 10  MG IV dose.  Call MD if SBP >/= 160 or DBP >/= 110.    Standing Status:   Standing    Number of Occurrences:   1   Meds ordered this encounter  Medications  . DISCONTD: labetalol (NORMODYNE) injection 20 mg  . DISCONTD: labetalol (NORMODYNE) injection 40 mg  . DISCONTD: labetalol (NORMODYNE) injection 80 mg  . DISCONTD: hydrALAZINE (APRESOLINE) injection 10 mg  . labetalol (NORMODYNE) 5 MG/ML injection    Druebbisch, Neta Upadhyay  : cabinet override  . magnesium bolus via infusion 4 g  . magnesium sulfate 40 grams in SWI 1000 mL OB infusion  . AND Linked Order Group   . labetalol (NORMODYNE) injection 20 mg   . labetalol (NORMODYNE) injection 40 mg   . labetalol (NORMODYNE) injection 80 mg   . hydrALAZINE (APRESOLINE) tablet 10 mg    Assessment and Plan   1. Severe pre-eclampsia in third trimester   2. NST (non-stress test) reactive   3. [redacted] weeks gestation of pregnancy    -admit for delivery  Gerrie Nordmann Aurianna Earlywine 01/31/2020, 7:15 PM

## 2020-01-31 NOTE — H&P (Addendum)
[name] [name] is a 29 y.o. female G1P0 at 15+ with severe Pre E (Bp elevated to severe range in MAU.  Pregnancy aklso complicated by POTS, hyperadrenic, hypermobility, maternal obesity, and depression/anxiet, deletion of Shocks gene (growth spurt).  EDC 02/27/20 by early US/ Panorama low risk.    OB History    Gravida  1   Para      Term      Preterm      AB      Living        SAB      TAB      Ectopic      Multiple      Live Births            G1P0  No abn pap No STD  Past Medical History:  Diagnosis Date  . Depression   . EDS (Ehlers-Danlos syndrome)   . Hyperlipidemia   . Migraine   . POTS (postural orthostatic tachycardia syndrome)   . Transaminitis   . Vitamin B12 deficiency   . Vitamin D deficiency    Past Surgical History:  Procedure Laterality Date  . EYE SURGERY     x2  . WISDOM TOOTH EXTRACTION     Family History: family history includes Anxiety disorder in her mother; Hypertension in her mother; Osteoarthritis in her paternal grandmother; Other in her father. Social History:  reports that she has never smoked. She has never used smokeless tobacco. She reports previous alcohol use. She reports previous drug use.married       Maternal Diabetes: No Genetic Screening: Normal Maternal Ultrasounds/Referrals: Normal Fetal Ultrasounds or other Referrals:  None Maternal Substance Abuse:  No Significant Maternal Medications:  None Significant Maternal Lab Results:  None Other Comments:  None  Review of Systems  Constitutional: Negative.   HENT: Negative.   Eyes: Negative.   Respiratory: Negative.   Cardiovascular: Negative.   Gastrointestinal: Negative.   Genitourinary: Negative.   Musculoskeletal: Negative.   Neurological: Positive for headaches (intermittent).  Psychiatric/Behavioral: Negative.    Maternal Medical History:  Reason for admission: Severe PreEclampsia  Contractions: Frequency: irregular.    Fetal activity: Perceived  fetal activity is normal.    Prenatal complications: Pre-eclampsia.   POTS hyperadrenic, severe PIH, hypermobility, maternal obesity, depression/anxiety  Prenatal Complications - Diabetes: none.      Blood pressure (!) 122/58, pulse 73, temperature 98.6 F (37 C), temperature source Oral, resp. rate 18, height 4' 9"  (1.448 m), weight 87.1 kg, SpO2 99 %. Maternal Exam:  Abdomen: Patient reports no abdominal tenderness. Fundal height is appropriate for gestation.       Physical Exam Constitutional:      Appearance: She is well-developed.  HENT:     Head: Normocephalic and atraumatic.  Pulmonary:     Effort: Pulmonary effort is normal.     Breath sounds: Normal breath sounds.  Abdominal:     General: Bowel sounds are normal.     Palpations: Abdomen is soft.     Comments: GRAVID  Musculoskeletal:        General: Normal range of motion.  Skin:    General: Skin is warm and dry.  Neurological:     Mental Status: She is alert.  Psychiatric:        Mood and Affect: Mood normal.        Behavior: Behavior normal.     Prenatal labs: ABO, Rh:  O+ Antibody:  neg Rubella:  immune RPR:   NR HBsAg:  neg HIV:   neg GBS:   unknown  Hgb 14.7/Plt 237/Ur Cx neg/Chl neg/GC neg/Hgb electro WNL/Pap WNL/essential panel neg (neg CF/SMA/Fragile X) Hep C neg/ glucola 129/Panorama low risk (female)  Dated by early [place] [facility] 02/27/20 [place] nl anat, ant plac, female [place] good growth  Assessment/Plan: 29yo G1P0 at 36+ w severe PreE Magnesium Sulfate 4g bolus/2g/hr D/W pt transfer to [facility] given our NICU is full, possible need for NICU care Labetalol protocol for HTN Pt transferred to Dr Lillia Carmel Plan for delivery  Kaedance Magos Bovard-Stuckert 01/31/2020, 7:20 PM

## 2020-01-31 NOTE — MAU Note (Signed)
Report given to [employer] in labor and delivery at [facility]

## 2020-01-31 NOTE — MAU Note (Signed)
[name] [name] is a 29 y.o. at 62w1dhere in MAU reporting: sent over from the office for a PIH eval. States she has a mild headache, [place] Den[place] visual changes.   Onset of complaint: today  Pain score: 2/10  Vitals:   01/31/20 1509  BP: (!) 153/110  Pulse: 94  Resp: 18  Temp: 98.6 F (37 C)  SpO2: 95%     FHT: EFM applied in room  Lab orders placed from triage: UA

## 2020-01-31 NOTE — MAU Note (Signed)
Report given to transport team

## 2020-02-01 LAB — CULTURE, OB URINE: Culture: NO GROWTH

## 2020-02-02 ENCOUNTER — Encounter: Payer: Medicaid Other | Admitting: Physical Therapy

## 2020-02-09 ENCOUNTER — Encounter: Payer: Medicaid Other | Admitting: Physical Therapy

## 2020-02-27 ENCOUNTER — Inpatient Hospital Stay (HOSPITAL_COMMUNITY): Admit: 2020-02-27 | Payer: Self-pay

## 2020-05-26 ENCOUNTER — Emergency Department (HOSPITAL_COMMUNITY): Payer: Medicaid Other

## 2020-05-26 ENCOUNTER — Encounter (HOSPITAL_COMMUNITY): Payer: Self-pay

## 2020-05-26 ENCOUNTER — Emergency Department (HOSPITAL_COMMUNITY)
Admission: EM | Admit: 2020-05-26 | Discharge: 2020-05-26 | Disposition: A | Discharge status: Home / Self Care | Payer: Medicaid Other | Attending: Emergency Medicine | Admitting: Emergency Medicine

## 2020-05-26 ENCOUNTER — Other Ambulatory Visit: Payer: Self-pay

## 2020-05-26 DIAGNOSIS — R531 Weakness: Secondary | ICD-10-CM | POA: Insufficient documentation

## 2020-05-26 DIAGNOSIS — R519 Headache, unspecified: Secondary | ICD-10-CM | POA: Diagnosis not present

## 2020-05-26 DIAGNOSIS — J019 Acute sinusitis, unspecified: Secondary | ICD-10-CM | POA: Insufficient documentation

## 2020-05-26 DIAGNOSIS — H53149 Visual discomfort, unspecified: Secondary | ICD-10-CM | POA: Insufficient documentation

## 2020-05-26 LAB — COMPREHENSIVE METABOLIC PANEL
ALT: 52 U/L — ABNORMAL HIGH (ref 0–44)
AST: 35 U/L (ref 15–41)
Albumin: 4.4 g/dL (ref 3.5–5.0)
Alkaline Phosphatase: 73 U/L (ref 38–126)
Anion gap: 10 (ref 5–15)
BUN: 8 mg/dL (ref 6–20)
CO2: 24 mmol/L (ref 22–32)
Calcium: 9.6 mg/dL (ref 8.9–10.3)
Chloride: 107 mmol/L (ref 98–111)
Creatinine, Ser: 0.79 mg/dL (ref 0.44–1.00)
GFR, Estimated: >60 mL/min (ref >60)
Glucose, Bld: 89 mg/dL (ref 70–99)
Potassium: 3.8 mmol/L (ref 3.5–5.1)
Sodium: 141 mmol/L (ref 135–145)
Total Bilirubin: 0.9 mg/dL (ref 0.3–1.2)
Total Protein: 7.9 g/dL (ref 6.5–8.1)

## 2020-05-26 LAB — CBC WITH DIFFERENTIAL/PLATELET
Abs Immature Granulocytes: 0.02 10*3/uL (ref 0.00–0.07)
Basophils Absolute: 0 10*3/uL (ref 0.0–0.1)
Basophils Relative: 0 %
Eosinophils Absolute: 0.2 10*3/uL (ref 0.0–0.5)
Eosinophils Relative: 4 %
HCT: 44.7 % (ref 36.0–46.0)
Hemoglobin: 14.9 g/dL (ref 12.0–15.0)
Immature Granulocytes: 0 %
Lymphocytes Relative: 39 %
Lymphs Abs: 2.4 10*3/uL (ref 0.7–4.0)
MCH: 28.9 pg (ref 26.0–34.0)
MCHC: 33.3 g/dL (ref 30.0–36.0)
MCV: 86.8 fL (ref 80.0–100.0)
Monocytes Absolute: 0.4 10*3/uL (ref 0.1–1.0)
Monocytes Relative: 7 %
Neutro Abs: 3.1 10*3/uL (ref 1.7–7.7)
Neutrophils Relative %: 50 %
Platelets: 243 10*3/uL (ref 150–400)
RBC: 5.15 MIL/uL — ABNORMAL HIGH (ref 3.87–5.11)
RDW: 13.2 % (ref 11.5–15.5)
WBC: 6.2 10*3/uL (ref 4.0–10.5)
nRBC: 0 % (ref 0.0–0.2)

## 2020-05-26 LAB — URINALYSIS, ROUTINE W REFLEX MICROSCOPIC
Bilirubin Urine: NEGATIVE
Glucose, UA: NEGATIVE mg/dL
Hgb urine dipstick: NEGATIVE
Ketones, ur: NEGATIVE mg/dL
Leukocytes,Ua: NEGATIVE
Nitrite: NEGATIVE
Protein, ur: NEGATIVE mg/dL
Specific Gravity, Urine: 1.009 (ref 1.005–1.030)
pH: 6 (ref 5.0–8.0)

## 2020-05-26 MED ORDER — SODIUM CHLORIDE 0.9 % IV BOLUS
1000.0000 mL | Freq: Once | INTRAVENOUS | Status: AC
  Administered 2020-05-26: 1000 mL via INTRAVENOUS

## 2020-05-26 MED ORDER — MAGNESIUM SULFATE 2 GM/50ML IV SOLN: 2.0000 g | Freq: Once | INTRAVENOUS | Status: DC

## 2020-05-26 MED ORDER — METOCLOPRAMIDE HCL 5 MG/ML IJ SOLN
10.0000 mg | Freq: Once | INTRAMUSCULAR | Status: AC
  Administered 2020-05-26: 10 mg via INTRAVENOUS
  Filled 2020-05-26: qty 2

## 2020-05-26 MED ORDER — DEXAMETHASONE 4 MG PO TABS
10.0000 mg | ORAL_TABLET | Freq: Once | ORAL | Status: AC
  Administered 2020-05-26: 10 mg via ORAL
  Filled 2020-05-26: qty 2

## 2020-05-26 MED ORDER — DEXAMETHASONE SODIUM PHOSPHATE 10 MG/ML IJ SOLN: 10.0000 mg | Freq: Once | INTRAMUSCULAR | Status: DC

## 2020-05-26 NOTE — ED Provider Notes (Signed)
[facility] DEPT Provider Note   CSN: [id number] Arrival date & time: 05/26/20  1419     History Chief Complaint  Patient presents with  . Headache    [name] [name] is a 30 y.o. female.  The history is provided by the patient and medical records.  Headache  [name] [name] is a 30 y.o. female who presents to the Emergency Department complaining of HA. She presents the emergency department accompanied by her significant other complaining of a headache. This started a few days ago. Today the headache significantly worsened and felt more like a migraine than a traditional headache. Today her headache became right-sided with a squeezing sharp and stopping feeling. It now is radiating to the left side as well. She has associated spots imploring vision. She took is omega 6 mg and 100 mg of sumatriptan today with no significant help. She has associated tightness in her right neck and shoulder blade. She does have a history of migraine headaches. She is also three months postpartum following cesarean delivery. She did have preeclampsia with the delivery. She also had problems with postpartum hypertension and is currently on 200 of labetalol BID. She is compliant with her home medications.    Denies fever, CP, AP. Complains of generalized weakness. Yesterday was a stressful and busy day for her. 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 She has not had any complications due to the condition. Paternal history is unknown.     Past Medical History:  Diagnosis Date  . Depression   . EDS (Ehlers-Danlos syndrome)   . Hyperlipidemia   . Migraine   . POTS (postural orthostatic tachycardia syndrome)   . Transaminitis   . Vitamin B12 deficiency   . Vitamin D deficiency     Patient Active Problem List   Diagnosis Date Noted  . NASH (nonalcoholic steatohepatitis) 06/26/2019  . Combined  hyperlipidemia 06/26/2019    Past Surgical History:  Procedure Laterality Date  . EYE SURGERY     x2  . WISDOM TOOTH EXTRACTION       OB History    Gravida  1   Para      Term      Preterm      AB      Living        SAB      IAB      Ectopic      Multiple      Live Births              Family History  Problem Relation Age of Onset  . Hypertension Mother   . Anxiety disorder Mother   . Other Father        unknown  . Osteoarthritis Paternal Grandmother     Social History   Tobacco Use  . Smoking status: Never Smoker  . Smokeless tobacco: Never Used  Vaping Use  . Vaping Use: Never used  Substance Use Topics  . Alcohol use: Not Currently  . Drug use: Not Currently    Home Medications Prior to Admission medications   Medication Sig Start Date End Date Taking? Authorizing Provider  ALPRAZolam Duanne Moron) 0.5 MG tablet Take 0.5 mg by mouth 3 (three) times daily as needed.  Patient not taking: Reported on 11/07/2019    [provider]  Azelastine-Fluticasone 137-50 MCG/ACT SUSP Place 1 spray into the nose 2 (two) times a day.    [provider]  baclofen (LIORESAL) 10 MG tablet Take 20 mg by mouth 3 (three) times daily as needed.  Patient not taking: Reported on 11/07/2019    [provider]  Botulinum Toxin Type A (BOTOX) 200 units SOLR Inject as directed every 3 (three) months.    [provider]  butalbital-acetaminophen-caffeine (FIORICET) 50-325-40 MG tablet Take 1 tablet by mouth every 4 (four) hours as needed. 10/13/19   [provider]  cetirizine (ZYRTEC) 10 MG tablet Take 10 mg by mouth daily.    [provider]  Cyanocobalamin 1000 MCG/ML KIT Inject 1 mL as directed every 30 (thirty) days.    [provider]  Erenumab-aooe (AIMOVIG) 53 MG/ML SOAJ Inject into the skin every 30 (thirty) days. Patient not taking: Reported on 11/07/2019    [provider]  fluticasone (FLONASE) 50  MCG/ACT nasal spray Place 1 spray into both nostrils daily.    [provider]  hydrOXYzine (ATARAX/VISTARIL) 50 MG tablet Take 50 mg by mouth every 8 (eight) hours as needed.     [provider]  ketorolac (TORADOL) 60 MG/2ML SOLN injection Inject 60 mg into the muscle once as needed.  Patient not taking: Reported on 11/07/2019 10/22/14   [provider]  Melatonin 1 MG CAPS Take by mouth. Patient not taking: Reported on 11/07/2019    [provider]  meloxicam (MOBIC) 7.5 MG tablet Take 7.5 mg by mouth daily as needed.  Patient not taking: Reported on 11/07/2019 11/24/14   [provider]  methylphenidate (RITALIN) 5 MG tablet Take 5 mg by mouth daily as needed.  Patient not taking: Reported on 11/07/2019    [provider]  metroNIDAZOLE (METROCREAM) 0.75 % cream Apply 1 application topically 2 (two) times daily. Patient not taking: Reported on 11/07/2019    [provider]  mometasone (ELOCON) 0.1 % cream Apply 1 application topically daily as needed.  Patient not taking: Reported on 11/07/2019    [provider]  montelukast (SINGULAIR) 4 MG PACK Take 4 mg by mouth daily as needed. Patient not taking: Reported on 11/07/2019    [provider]  Multiple Vitamins-Minerals (ONE-A-DAY WOMENS 50 PLUS PO) Take by mouth. Patient not taking: Reported on 11/07/2019    [provider]  omega-3 acid ethyl esters (LOVAZA) 1 g capsule Take 1 g by mouth daily. Take 4 capsules daily Patient not taking: Reported on 11/07/2019    [provider]  omega-3 acid ethyl esters (LOVAZA) 1 g capsule Take 2 g by mouth 2 (two) times a day. Patient not taking: Reported on 11/07/2019    [provider]  Prenatal Multivit-Min-Fe-FA (PRE-NATAL PO) Take by mouth.    [provider]  Probiotic Product (PROBIOTIC ADVANCED PO) Take by mouth.    [provider]  propranolol (INDERAL) 20 MG tablet 20 mg 4 (four) times  daily.  08/26/18   [provider]  sertraline (ZOLOFT) 25 MG tablet Take 50 mg by mouth daily.     [provider]  SUMAtriptan (IMITREX) 25 MG tablet Take 100 mg by mouth daily as needed.    [provider]  tamsulosin (FLOMAX) 0.4 MG CAPS capsule Take 0.4 mg by mouth. Patient not taking: Reported on 11/07/2019    [provider]  zolmitriptan (ZOMIG) 5 MG nasal solution Place 1 spray into the nose daily as needed.  Patient not taking: Reported on 11/07/2019    [provider]  zolpidem (AMBIEN) 5 MG tablet Take 10 mg by  mouth at bedtime.  Patient not taking: Reported on 11/07/2019    [provider]    Allergies    Patient has no known allergies.  Review of Systems   Review of Systems  Neurological: Positive for headaches.  All other systems reviewed and are negative.   Physical Exam Updated Vital Signs BP (!) 136/93   Pulse 87   Temp 98.3 F (36.8 C) (Oral)   Resp 18   SpO2 99%   Physical Exam Vitals and nursing note reviewed.  Constitutional:      Appearance: She is well-developed and well-nourished.  HENT:     Head: Normocephalic and atraumatic.  Eyes:     Extraocular Movements: Extraocular movements intact.     Pupils: Pupils are equal, round, and reactive to light.     Comments: Photophobia  Cardiovascular:     Rate and Rhythm: Normal rate and regular rhythm.     Heart sounds: No murmur heard.   Pulmonary:     Effort: Pulmonary effort is normal. No respiratory distress.     Breath sounds: Normal breath sounds.  Abdominal:     Palpations: Abdomen is soft.     Tenderness: There is no abdominal tenderness. There is no guarding or rebound.  Musculoskeletal:        General: No tenderness or edema.  Skin:    General: Skin is warm and dry.  Neurological:     Mental Status: She is alert and oriented to person, place, and time.     Comments: No asymmetry of facial movements. Five out of five strength in all four  extremities with sensational light touch intact in all four extremities  Psychiatric:        Mood and Affect: Mood and affect normal.        Behavior: Behavior normal.     ED Results / Procedures / Treatments   Labs (all labs ordered are listed, but only abnormal results are displayed) Labs Reviewed  URINALYSIS, ROUTINE W REFLEX MICROSCOPIC    EKG None  Radiology No results found.  Procedures Procedures   Medications Ordered in ED Medications - No data to display  ED Course  I have reviewed the triage vital signs and the nursing notes.  Pertinent labs & imaging results that were available during my care of the patient were reviewed by me and considered in my medical decision making (see chart for details).    MDM Rules/Calculators/A&P                         patient with history of Fredderick Phenix downloaders syndrome approximately three months postpartum here for evaluation of migraine. She is non-toxic appearing on evaluation with no focal neurologic deficits. She was hypertensive on ED presentation, blood pressure improved with management of her headache. Presentation is not consistent with postdelivery preeclampsia, hypertensive urgency, subarachnoid hemorrhage, dural sinus thrombosis, meningitis. Discussed with patient home care for migraine. Discussed outpatient follow-up and return precautions.  Final Clinical Impression(s) / ED Diagnoses Final diagnoses:  Bad headache    Rx / DC Orders ED Discharge Orders    None       Quintella Reichert, MD 05/26/20 2246

## 2020-05-26 NOTE — ED Triage Notes (Signed)
Pt presents with c/o migraine that occurred this morning. Pt is three months postpartum, had some issues with pre-eclampsia. Today, pt was seeing spots in her vision, went to UC and was sent here because they were concerned about it being related to postpartum. Pt reports pain 8/10.

## 2020-06-06 ENCOUNTER — Encounter: Payer: Self-pay | Admitting: Internal Medicine

## 2020-06-06 ENCOUNTER — Ambulatory Visit (INDEPENDENT_AMBULATORY_CARE_PROVIDER_SITE_OTHER): Payer: Medicaid Other | Admitting: Internal Medicine

## 2020-06-06 ENCOUNTER — Other Ambulatory Visit: Payer: Self-pay

## 2020-06-06 VITALS — BP 150/92 | HR 92 | Ht <= 58 in | Wt 184.2 lb

## 2020-06-06 DIAGNOSIS — E782 Mixed hyperlipidemia: Secondary | ICD-10-CM

## 2020-06-06 DIAGNOSIS — I10 Essential (primary) hypertension: Secondary | ICD-10-CM

## 2020-06-06 LAB — COMPREHENSIVE METABOLIC PANEL
ALT: 54 U/L — ABNORMAL HIGH (ref 0–35)
AST: 31 U/L (ref 0–37)
Albumin: 4.6 g/dL (ref 3.5–5.2)
Alkaline Phosphatase: 67 U/L (ref 39–117)
BUN: 16 mg/dL (ref 6–23)
CO2: 24 mEq/L (ref 19–32)
Calcium: 9.6 mg/dL (ref 8.4–10.5)
Chloride: 107 mEq/L (ref 96–112)
Creatinine, Ser: 0.78 mg/dL (ref 0.40–1.20)
GFR: 101.4 mL/min (ref >60.00)
Glucose, Bld: 91 mg/dL (ref 70–99)
Potassium: 4.2 mEq/L (ref 3.5–5.1)
Sodium: 138 mEq/L (ref 135–145)
Total Bilirubin: 0.4 mg/dL (ref 0.2–1.2)
Total Protein: 7.4 g/dL (ref 6.0–8.3)

## 2020-06-06 LAB — LIPID PANEL
Cholesterol: 313 mg/dL — ABNORMAL HIGH (ref 0–200)
HDL: 41.8 mg/dL (ref >39.00)
NonHDL: 271.6
Total CHOL/HDL Ratio: 7
Triglycerides: 276 mg/dL — ABNORMAL HIGH (ref 0.0–149.0)
VLDL: 55.2 mg/dL — ABNORMAL HIGH (ref 0.0–40.0)

## 2020-06-06 LAB — TSH: TSH: 1.55 u[IU]/mL (ref 0.35–4.50)

## 2020-06-06 LAB — LDL CHOLESTEROL, DIRECT: Direct LDL: 206 mg/dL

## 2020-06-06 MED ORDER — LABETALOL HCL 200 MG PO TABS: 200.0000 mg | ORAL_TABLET | ORAL | 0 refills | Status: DC

## 2020-06-06 NOTE — Progress Notes (Deleted)
Name: [name] [name]  MRN/ DOB: [id number], December 29, 1990    Age/ Sex: 30 y.o., female     PCP: Mila Palmer, MD   Reason for Endocrinology Evaluation: Combined Dyslipidemia      Initial Endocrinology Clinic Visit: 09/27/18    PATIENT IDENTIFIER: [name] [name] is a 30 y.o., female with a past medical history of Combined dyslipidemia, Deletion of SHOX (short stature), joint hypermobility  And NASH (sono 03/2018). She has followed with [facility] since 09/27/18 for consultative assistance with management of her Hyperlipidemia and NASH.   HISTORICAL SUMMARY:  She was diagnosed with elevated TG at age 55 , which was in 800's range, of note the patient was on OCP's at the time for years. She was started on Lovaza at the time 4 grams daily  Which improved her TG levels to 200's. LDL increased was noted in 2016. She has tried Lipitor but made her nauseous and weak, she was then switched to another statin but on her initial visit to our clinic she was on rosuvastatin.   No FH of dyslipidemia on mother side, unknown paternal history.  No FH of early death, or stoke on maternal side.   No Hx of pancreatitis   Her triglycerides improved tremendously to 176 mg/DL  with stopping the combined oral contraceptive pills.  She is S/P C-section 02/02/2020, Girl  [place]    SUBJECTIVE:    Today (06/06/2020):  [name] [name] is here for a follow up on combined hyperlipidemia.  She is S/P C-Section 02/01/2021  She is nursing  Denies nausea or vomiting  Denies abdominal pain  Denies sob but has occasional palpitations      HISTORY:  Past Medical History:  Past Medical History:  Diagnosis Date  . Depression   . EDS (Ehlers-Danlos syndrome)   . Hyperlipidemia   . Migraine   . POTS (postural orthostatic tachycardia syndrome)   . Transaminitis   . Vitamin B12 deficiency   . Vitamin D deficiency    Past Surgical History:  Past Surgical History:  Procedure Laterality Date   . EYE SURGERY     x2  . WISDOM TOOTH EXTRACTION      Social History:  reports that she has never smoked. She has never used smokeless tobacco. She reports previous alcohol use. She reports previous drug use. Family History:  Family History  Problem Relation Age of Onset  . Hypertension Mother   . Anxiety disorder Mother   . Other Father        unknown  . Osteoarthritis Paternal Grandmother      HOME MEDICATIONS: Allergies as of 06/06/2020   No Known Allergies     Medication List       Accurate as of June 06, 2020  7:08 AM. If you have any questions, ask your nurse or doctor.        Aimovig 70 MG/ML Soaj Generic drug: Erenumab-aooe Inject into the skin every 30 (thirty) days.   ALPRAZolam 0.5 MG tablet Commonly known as: XANAX Take 0.5 mg by mouth 3 (three) times daily as needed.   Azelastine-Fluticasone 137-50 MCG/ACT Susp Place 1 spray into the nose 2 (two) times a day.   baclofen 10 MG tablet Commonly known as: LIORESAL Take 20 mg by mouth 3 (three) times daily as needed.   Botox 200 units Solr Generic drug: Botulinum Toxin Type A Inject as directed every 3 (three) months.   butalbital-acetaminophen-caffeine 50-325-40 MG tablet Commonly known as: FIORICET Take 1 tablet by  mouth every 4 (four) hours as needed.   cetirizine 10 MG tablet Commonly known as: ZYRTEC Take 10 mg by mouth daily.   Cyanocobalamin 1000 MCG/ML Kit Inject 1 mL as directed every 30 (thirty) days.   fluticasone 50 MCG/ACT nasal spray Commonly known as: FLONASE Place 1 spray into both nostrils daily.   hydrOXYzine 50 MG tablet Commonly known as: ATARAX/VISTARIL Take 50 mg by mouth every 8 (eight) hours as needed.   ketorolac 60 MG/2ML Soln injection Commonly known as: TORADOL Inject 60 mg into the muscle once as needed.   Melatonin 1 MG Caps Take by mouth.   meloxicam 7.5 MG tablet Commonly known as: MOBIC Take 7.5 mg by mouth daily as needed.   methylphenidate 5 MG  tablet Commonly known as: RITALIN Take 5 mg by mouth daily as needed.   metroNIDAZOLE 0.75 % cream Commonly known as: METROCREAM Apply 1 application topically 2 (two) times daily.   mometasone 0.1 % cream Commonly known as: ELOCON Apply 1 application topically daily as needed.   montelukast 4 MG Pack Commonly known as: SINGULAIR Take 4 mg by mouth daily as needed.   omega-3 acid ethyl esters 1 g capsule Commonly known as: LOVAZA Take 1 g by mouth daily. Take 4 capsules daily   omega-3 acid ethyl esters 1 g capsule Commonly known as: LOVAZA Take 2 g by mouth 2 (two) times a day.   ONE-A-DAY WOMENS 50 PLUS PO Take by mouth.   PRE-NATAL PO Take by mouth.   PROBIOTIC ADVANCED PO Take by mouth.   propranolol 20 MG tablet Commonly known as: INDERAL 20 mg 4 (four) times daily.   sertraline 25 MG tablet Commonly known as: ZOLOFT Take 50 mg by mouth daily.   SUMAtriptan 25 MG tablet Commonly known as: IMITREX Take 100 mg by mouth daily as needed.   tamsulosin 0.4 MG Caps capsule Commonly known as: FLOMAX Take 0.4 mg by mouth.   zolpidem 5 MG tablet Commonly known as: AMBIEN Take 10 mg by mouth at bedtime.   Zomig 5 MG nasal solution Generic drug: zolmitriptan Place 1 spray into the nose daily as needed.         OBJECTIVE:   PHYSICAL EXAM: VS: BP (!) 150/92   Pulse 92   Ht 4\' 9"  (1.448 m)   Wt 184 lb 4 oz (83.6 kg)   SpO2 98%   Breastfeeding Yes   BMI 39.87 kg/m    EXAM: General: Pt appears well and is in NAD  Lungs: Clear with good BS bilat with no rales, rhonchi, or wheezes  Heart: Auscultation: RRR.  Abdomen: Normoactive bowel sounds, soft, nontender, without masses or organomegaly palpable  Extremities:  BL LE: No pretibial edema normal ROM and strength.  Mental Status: Judgment, insight: Intact Mood and affect: No depression, anxiety, or agitation     DATA REVIEWED: Results for [name] [name], [name] [name] (MRN [id number]) as of 06/07/2020 13:57   Ref. Range 06/06/2020 12:10  Sodium Latest Ref Range: 135 - 145 mEq/L 138  Potassium Latest Ref Range: 3.5 - 5.1 mEq/L 4.2  Chloride Latest Ref Range: 96 - 112 mEq/L 107  CO2 Latest Ref Range: 19 - 32 mEq/L 24  Glucose Latest Ref Range: 70 - 99 mg/dL 91  BUN Latest Ref Range: 6 - 23 mg/dL 16  Creatinine Latest Ref Range: 0.40 - 1.20 mg/dL 4.09  Calcium Latest Ref Range: 8.4 - 10.5 mg/dL 9.6  Alkaline Phosphatase Latest Ref Range: 39 - 117 U/L 67  Albumin Latest Ref Range: 3.5 - 5.2 g/dL 4.6  AST Latest Ref Range: 0 - 37 U/L 31  ALT Latest Ref Range: 0 - 35 U/L 54 (H)  Total Protein Latest Ref Range: 6.0 - 8.3 g/dL 7.4  Total Bilirubin Latest Ref Range: 0.2 - 1.2 mg/dL 0.4  GFR Latest Ref Range: >60.00 mL/min 101.40  Total CHOL/HDL Ratio Unknown 7  Cholesterol Latest Ref Range: 0 - 200 mg/dL 409 (H)  HDL Cholesterol Latest Ref Range: >39.00 mg/dL 81.19  Direct LDL Latest Units: mg/dL 147.8  NonHDL Unknown 295.62  Triglycerides Latest Ref Range: 0.0 - 149.0 mg/dL 130.8 (H)  VLDL Latest Ref Range: 0.0 - 40.0 mg/dL 65.7 (H)  TSH Latest Ref Range: 0.35 - 4.50 uIU/mL 1.55     06/03/2018 Alp 47 U/L  AST 90  U/L  Alt 89 U/L T. Chol 286 mg/dL TG 846 HDL 41  LDL 962 mg/dL     ASSESSMENT / PLAN / RECOMMENDATIONS:   1. Combined Hyperlipidemia During pregnancy:  - Pt has no stigmata of hypercholesterolemia - No Hx of premature CAD , CVA or pancreatitis  - Repeat labs shows improvement  in Tg , levels but her LDL is higher then it has been. Unfortunately STATIN  therapy is not recommended during nursing phase . I am going to again recommend STRICT  DIETARY changes, with low fat diet , eating more fruits and vegetables    2. Hypertension :    - BP elevated today, I have asked her to increase Labetalol as below until she sees her electrophysiologist at [facility]     Medication  Labetalol 200 mg, 1 tablet in the morning and 1.5 tabs in the evening    3. NASH:  - Alt elevated  stable at 54 mg/dL    F/U in 6 months    Signed electronically by: Lyndle Herrlich, MD  [facility]  [facility] [address] [place], [place] [address] Phone: [phone] FAX: [phone]      CC: Mila Palmer, MD [address] [place] [address] Phone: [phone]  Fax: [phone]   Return to Endocrinology clinic as below: Future Appointments  Date Time Provider Department Center  06/06/2020 11:10 AM Keanan Melander, Konrad Dolores, MD [facility] None

## 2020-06-06 NOTE — Progress Notes (Unsigned)
Name: [name] [name]  MRN/ DOB: [id number], 05-Sep-1990    Age/ Sex: 30 y.o., female     PCP: Mila Palmer, MD   Reason for Endocrinology Evaluation: Combined Dyslipidemia      Initial Endocrinology Clinic Visit: 09/27/18    PATIENT IDENTIFIER: [name] [name] is a 30 y.o., female with a past medical history of Combined dyslipidemia, Deletion of SHOX (short stature), joint hypermobility  And NASH (sono 03/2018). She has followed with [facility] since 09/27/18 for consultative assistance with management of her Hyperlipidemia and NASH.   HISTORICAL SUMMARY:  She was diagnosed with elevated TG at age 63 , which was in 800's range, of note the patient was on OCP's at the time for years. She was started on Lovaza at the time 4 grams daily  Which improved her TG levels to 200's. LDL increased was noted in 2016. She has tried Lipitor but made her nauseous and weak, she was then switched to another statin but on her initial visit to our clinic she was on rosuvastatin.   No FH of dyslipidemia on mother side, unknown paternal history.  No FH of early death, or stoke on maternal side.   No Hx of pancreatitis   Her triglycerides improved tremendously to 176 mg/DL  with stopping the combined oral contraceptive pills.  She is S/P C-section 01/28/2020, Girl    SUBJECTIVE:    Today (06/06/2020):  Ms. Sorto is here for a follow up on combined hyperlipidemia.   Denies nausea or vomiting  Has occasional chest pain and palpitation with high blood pressure  Occasional sharp and achy left sided abdominal pain for the past 2-3 months.  She tries to be on a low-fat diet   She is having a girl "may be Audry"       HISTORY:  Past Medical History:  Past Medical History:  Diagnosis Date  . Depression   . EDS (Ehlers-Danlos syndrome)   . Hyperlipidemia   . Migraine   . POTS (postural orthostatic tachycardia syndrome)   . Transaminitis   . Vitamin B12 deficiency   .  Vitamin D deficiency    Past Surgical History:  Past Surgical History:  Procedure Laterality Date  . EYE SURGERY     x2  . WISDOM TOOTH EXTRACTION      Social History:  reports that she has never smoked. She has never used smokeless tobacco. She reports previous alcohol use. She reports previous drug use. Family History:  Family History  Problem Relation Age of Onset  . Hypertension Mother   . Anxiety disorder Mother   . Other Father        unknown  . Osteoarthritis Paternal Grandmother      HOME MEDICATIONS: Allergies as of 06/06/2020   No Known Allergies     Medication List       Accurate as of June 06, 2020  7:08 AM. If you have any questions, ask your nurse or doctor.        Aimovig 70 MG/ML Soaj Generic drug: Erenumab-aooe Inject into the skin every 30 (thirty) days.   ALPRAZolam 0.5 MG tablet Commonly known as: XANAX Take 0.5 mg by mouth 3 (three) times daily as needed.   Azelastine-Fluticasone 137-50 MCG/ACT Susp Place 1 spray into the nose 2 (two) times a day.   baclofen 10 MG tablet Commonly known as: LIORESAL Take 20 mg by mouth 3 (three) times daily as needed.   Botox 200 units Solr Generic drug: Botulinum Toxin  Type A Inject as directed every 3 (three) months.   butalbital-acetaminophen-caffeine 50-325-40 MG tablet Commonly known as: FIORICET Take 1 tablet by mouth every 4 (four) hours as needed.   cetirizine 10 MG tablet Commonly known as: ZYRTEC Take 10 mg by mouth daily.   Cyanocobalamin 1000 MCG/ML Kit Inject 1 mL as directed every 30 (thirty) days.   fluticasone 50 MCG/ACT nasal spray Commonly known as: FLONASE Place 1 spray into both nostrils daily.   hydrOXYzine 50 MG tablet Commonly known as: ATARAX/VISTARIL Take 50 mg by mouth every 8 (eight) hours as needed.   ketorolac 60 MG/2ML Soln injection Commonly known as: TORADOL Inject 60 mg into the muscle once as needed.   Melatonin 1 MG Caps Take by mouth.   meloxicam 7.5  MG tablet Commonly known as: MOBIC Take 7.5 mg by mouth daily as needed.   methylphenidate 5 MG tablet Commonly known as: RITALIN Take 5 mg by mouth daily as needed.   metroNIDAZOLE 0.75 % cream Commonly known as: METROCREAM Apply 1 application topically 2 (two) times daily.   mometasone 0.1 % cream Commonly known as: ELOCON Apply 1 application topically daily as needed.   montelukast 4 MG Pack Commonly known as: SINGULAIR Take 4 mg by mouth daily as needed.   omega-3 acid ethyl esters 1 g capsule Commonly known as: LOVAZA Take 1 g by mouth daily. Take 4 capsules daily   omega-3 acid ethyl esters 1 g capsule Commonly known as: LOVAZA Take 2 g by mouth 2 (two) times a day.   ONE-A-DAY WOMENS 50 PLUS PO Take by mouth.   PRE-NATAL PO Take by mouth.   PROBIOTIC ADVANCED PO Take by mouth.   propranolol 20 MG tablet Commonly known as: INDERAL 20 mg 4 (four) times daily.   sertraline 25 MG tablet Commonly known as: ZOLOFT Take 50 mg by mouth daily.   SUMAtriptan 25 MG tablet Commonly known as: IMITREX Take 100 mg by mouth daily as needed.   tamsulosin 0.4 MG Caps capsule Commonly known as: FLOMAX Take 0.4 mg by mouth.   zolpidem 5 MG tablet Commonly known as: AMBIEN Take 10 mg by mouth at bedtime.   Zomig 5 MG nasal solution Generic drug: zolmitriptan Place 1 spray into the nose daily as needed.         OBJECTIVE:   PHYSICAL EXAM: VS: There were no vitals taken for this visit.   EXAM: General: Pt appears well and is in NAD  Lungs: Clear with good BS bilat with no rales, rhonchi, or wheezes  Heart: Auscultation: RRR.  Abdomen: Normoactive bowel sounds, soft, nontender, without masses or organomegaly palpable  Extremities:  BL LE: No pretibial edema normal ROM and strength.  Mental Status: Judgment, insight: Intact Mood and affect: No depression, anxiety, or agitation     DATA REVIEWED:  Results for MYKAELA, CHAPLAIN (MRN [id number]) as of  12/06/2019 08:57  Ref. Range 12/04/2019 10:26  Total CHOL/HDL Ratio Unknown 6  Cholesterol Latest Ref Range: 0 - 200 mg/dL 621 (H)  HDL Cholesterol Latest Ref Range: >39.00 mg/dL 30.86  Direct LDL Latest Units: mg/dL 578.4  Triglycerides Latest Ref Range: 0 - 149 mg/dL 696.2 Triglyceride is over 400; calculations on Lipids are invalid. (H)    06/03/2018 Alp 47 U/L  AST 90  U/L  Alt 89 U/L T. Chol 286 mg/dL TG 952 HDL 41  LDL 841 mg/dL     ASSESSMENT / PLAN / RECOMMENDATIONS:   1. Combined Hyperlipidemia During pregnancy:  -  Pt has no stigmata of hypercholesterolemia - No Hx of premature CAD , CVA or pancreatitis - She understands that lipid lowering therapies are CONTRAINDICATED during pregnancy. Fenofibrates are sometimes initiated during the second trimester, there's no extensive data on lovaza but thought to be safe at this point.  - Repeat labs shows continued elevated in Tg , I suspect part of this dietary as well as pregnancy. This is considered moderate elevation and will focus on DIETARY changes.  - Treatment will considered with Tg > 800 mg/dL.  - We emphasized the importance of low fat diet , eating more fruits and vegetables      F/U in 6 months    Signed electronically by: Lyndle Herrlich, MD  [facility]  [facility] [address] [place], [place] [address] Phone: [phone] FAX: [phone]      CC: Mila Palmer, MD [address] [place] [address] Phone: [phone]  Fax: [phone]   Return to Endocrinology clinic as below: Future Appointments  Date Time Provider Department Center  06/06/2020 11:10 AM Shamleffer, Konrad Dolores, MD [id number] None

## 2020-06-07 DIAGNOSIS — I10 Essential (primary) hypertension: Secondary | ICD-10-CM | POA: Insufficient documentation

## 2020-06-07 NOTE — Progress Notes (Signed)
 Name: [name] [name]  MRN/ DOB: [id number], 09/23/1990    Age/ Sex: 30 y.o., female     PCP: Wolters, Sharon, MD   Reason for Endocrinology Evaluation: Combined Dyslipidemia      Initial Endocrinology Clinic Visit: 09/27/18    PATIENT IDENTIFIER: 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 She has followed with [facility] since 09/27/18 for consultative assistance with management of her Hyperlipidemia and NASH.   HISTORICAL SUMMARY:  She was diagnosed with elevated TG at age 17 , which was in 800's range, of note the patient was on OCP's at the time for years. She was started on Lovaza at the time 4 grams daily  Which improved her TG levels to 200's. LDL increased was noted in 2016. She has tried Lipitor but made her nauseous and weak, she was then switched to another statin but on her initial visit to our clinic she was on rosuvastatin.   No FH of dyslipidemia on mother side, unknown paternal history.  No FH of early death, or stoke on maternal side.   No Hx of pancreatitis   Her triglycerides improved tremendously to 176 mg/DL  with stopping the combined oral contraceptive pills.  She is S/P C-section 02/02/2020, Girl  Audry    SUBJECTIVE:    Today (06/06/2020):  [name] is here for a follow up on combined hyperlipidemia.  She is S/P C-Section 02/01/2021  She is nursing  Denies nausea or vomiting  Denies abdominal pain  Denies sob but has occasional palpitations      HISTORY:  Past Medical History:  . Past Medical History:  . Diagnosis . Date  . . . Depression .   . . . EDS (Ehlers-Danlos syndrome) .   . . . Hyperlipidemia .   . . . Migraine .   . . . POTS (postural orthostatic tachycardia syndrome) .   . . . Transaminitis .   . . . Vitamin B12 deficiency .   . . . Vitamin D deficiency .   .  Past Surgical  History:  . Past Surgical History:  . Procedure . Laterality . Date  . . . EYE SURGERY .  .   .  . x2  . . . WISDOM TOOTH EXTRACTION .  .   .   Social History:  reports that she has never smoked. She has never used smokeless tobacco. She reports previous alcohol use. She reports previous drug use. Family History:  . Family History  . Problem . Relation . Age of Onset  . . . Hypertension . Mother .   . . . Anxiety disorder . Mother .   . . . Other . Father .   .  .     unknown  . . . Osteoarthritis . Paternal Grandmother .   .    HOME MEDICATIONS: Allergies as of 06/06/2020   No Known Allergies     Medication List       Accurate as of June 06, 2020  7:08 AM. If you have any questions, ask your nurse or doctor.        Aimovig 70 MG/ML Soaj Generic drug: Erenumab-aooe Inject into the skin every 30 (thirty) days.   ALPRAZolam 0.5 MG tablet Commonly known as: XANAX Take 0.5 mg by mouth 3 (three) times daily as needed.   Azelastine-Fluticasone 137-50 MCG/ACT    Name: [name] [name]  MRN/ DOB: [id number], 09/23/1990    Age/ Sex: 30 y.o., female     PCP: Wolters, Sharon, MD   Reason for Endocrinology Evaluation: Combined Dyslipidemia      Initial Endocrinology Clinic Visit: 09/27/18    PATIENT IDENTIFIER: 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 She has followed with [facility] since 09/27/18 for consultative assistance with management of her Hyperlipidemia and NASH.   HISTORICAL SUMMARY:  She was diagnosed with elevated TG at age 17 , which was in 800's range, of note the patient was on OCP's at the time for years. She was started on Lovaza at the time 4 grams daily  Which improved her TG levels to 200's. LDL increased was noted in 2016. She has tried Lipitor but made her nauseous and weak, she was then switched to another statin but on her initial visit to our clinic she was on rosuvastatin.   No FH of dyslipidemia on mother side, unknown paternal history.  No FH of early death, or stoke on maternal side.   No Hx of pancreatitis   Her triglycerides improved tremendously to 176 mg/DL  with stopping the combined oral contraceptive pills.  She is S/P C-section 02/02/2020, Girl  Audry    SUBJECTIVE:    Today (06/06/2020):  [name] is here for a follow up on combined hyperlipidemia.  She is S/P C-Section 02/01/2021  She is nursing  Denies nausea or vomiting  Denies abdominal pain  Denies sob but has occasional palpitations      HISTORY:  Past Medical History:  . Past Medical History:  . Diagnosis . Date  . . . Depression .   . . . EDS (Ehlers-Danlos syndrome) .   . . . Hyperlipidemia .   . . . Migraine .   . . . POTS (postural orthostatic tachycardia syndrome) .   . . . Transaminitis .   . . . Vitamin B12 deficiency .   . . . Vitamin D deficiency .   .  Past Surgical  History:  . Past Surgical History:  . Procedure . Laterality . Date  . . . EYE SURGERY .  .   .  . x2  . . . WISDOM TOOTH EXTRACTION .  .   .   Social History:  reports that she has never smoked. She has never used smokeless tobacco. She reports previous alcohol use. She reports previous drug use. Family History:  . Family History  . Problem . Relation . Age of Onset  . . . Hypertension . Mother .   . . . Anxiety disorder . Mother .   . . . Other . Father .   .  .     unknown  . . . Osteoarthritis . Paternal Grandmother .   .    HOME MEDICATIONS: Allergies as of 06/06/2020   No Known Allergies     Medication List       Accurate as of June 06, 2020  7:08 AM. If you have any questions, ask your nurse or doctor.        Aimovig 70 MG/ML Soaj Generic drug: Erenumab-aooe Inject into the skin every 30 (thirty) days.   ALPRAZolam 0.5 MG tablet Commonly known as: XANAX Take 0.5 mg by mouth 3 (three) times daily as needed.   Azelastine-Fluticasone 137-50 MCG/ACT    Name: [name] [name]  MRN/ DOB: [id number], 09/23/1990    Age/ Sex: 30 y.o., female     PCP: Wolters, Sharon, MD   Reason for Endocrinology Evaluation: Combined Dyslipidemia      Initial Endocrinology Clinic Visit: 09/27/18    PATIENT IDENTIFIER: 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 She has followed with [facility] since 09/27/18 for consultative assistance with management of her Hyperlipidemia and NASH.   HISTORICAL SUMMARY:  She was diagnosed with elevated TG at age 17 , which was in 800's range, of note the patient was on OCP's at the time for years. She was started on Lovaza at the time 4 grams daily  Which improved her TG levels to 200's. LDL increased was noted in 2016. She has tried Lipitor but made her nauseous and weak, she was then switched to another statin but on her initial visit to our clinic she was on rosuvastatin.   No FH of dyslipidemia on mother side, unknown paternal history.  No FH of early death, or stoke on maternal side.   No Hx of pancreatitis   Her triglycerides improved tremendously to 176 mg/DL  with stopping the combined oral contraceptive pills.  She is S/P C-section 02/02/2020, Girl  Audry    SUBJECTIVE:    Today (06/06/2020):  [name] is here for a follow up on combined hyperlipidemia.  She is S/P C-Section 02/01/2021  She is nursing  Denies nausea or vomiting  Denies abdominal pain  Denies sob but has occasional palpitations      HISTORY:  Past Medical History:  . Past Medical History:  . Diagnosis . Date  . . . Depression .   . . . EDS (Ehlers-Danlos syndrome) .   . . . Hyperlipidemia .   . . . Migraine .   . . . POTS (postural orthostatic tachycardia syndrome) .   . . . Transaminitis .   . . . Vitamin B12 deficiency .   . . . Vitamin D deficiency .   .  Past Surgical  History:  . Past Surgical History:  . Procedure . Laterality . Date  . . . EYE SURGERY .  .   .  . x2  . . . WISDOM TOOTH EXTRACTION .  .   .   Social History:  reports that she has never smoked. She has never used smokeless tobacco. She reports previous alcohol use. She reports previous drug use. Family History:  . Family History  . Problem . Relation . Age of Onset  . . . Hypertension . Mother .   . . . Anxiety disorder . Mother .   . . . Other . Father .   .  .     unknown  . . . Osteoarthritis . Paternal Grandmother .   .    HOME MEDICATIONS: Allergies as of 06/06/2020   No Known Allergies     Medication List       Accurate as of June 06, 2020  7:08 AM. If you have any questions, ask your nurse or doctor.        Aimovig 70 MG/ML Soaj Generic drug: Erenumab-aooe Inject into the skin every 30 (thirty) days.   ALPRAZolam 0.5 MG tablet Commonly known as: XANAX Take 0.5 mg by mouth 3 (three) times daily as needed.   Azelastine-Fluticasone 137-50 MCG/ACT

## 2020-08-15 ENCOUNTER — Encounter: Payer: Self-pay | Admitting: Internal Medicine

## 2020-10-16 ENCOUNTER — Other Ambulatory Visit: Payer: Self-pay | Admitting: Internal Medicine

## 2020-10-16 DIAGNOSIS — I10 Essential (primary) hypertension: Secondary | ICD-10-CM

## 2020-12-11 ENCOUNTER — Ambulatory Visit: Payer: Medicaid Other | Admitting: Internal Medicine

## 2021-01-20 ENCOUNTER — Ambulatory Visit: Payer: Medicaid Other | Admitting: Internal Medicine

## 2021-01-20 NOTE — Progress Notes (Deleted)
 Name: Carolin Sippel  MRN/ DOB: [id number], 03/05/1991    Age/ Sex: 30 y.o., female     PCP: Wolters, Sharon, MD   Reason for Endocrinology Evaluation: Combined Dyslipidemia      Initial Endocrinology Clinic Visit: 09/27/18    PATIENT IDENTIFIER: Ms. 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 She has followed with [facility] since 09/27/18 for consultative assistance with management of her Hyperlipidemia and NASH.   HISTORICAL SUMMARY:  She was diagnosed with elevated TG at age 17 , which was in 800's range, of note the patient was on OCP's at the time for years. She was started on Lovaza at the time 4 grams daily  Which improved her TG levels to 200's. LDL increased was noted in 2016. She has tried Lipitor but made her nauseous and weak, she was then switched to another statin but on her initial visit to our clinic she was on rosuvastatin.     No FH of dyslipidemia on mother side, unknown paternal history.  No FH of early death, or stoke on maternal side.   No Hx of pancreatitis   Her triglycerides improved tremendously to 176 mg/DL  with stopping the combined oral contraceptive pills.  She is S/P C-section 02/02/2020, Girl  Audry    SUBJECTIVE:    Today (06/06/2020):  Ms. Reicks is here for a follow up on combined hyperlipidemia.  She is S/P C-Section 02/01/2021  She is nursing  Denies nausea or vomiting  Denies abdominal pain  Denies sob but has occasional palpitations      HISTORY:  Past Medical History:  Past Medical History:  Diagnosis Date   Depression    EDS (Ehlers-Danlos syndrome)    Hyperlipidemia    Migraine    POTS (postural orthostatic tachycardia syndrome)    Transaminitis    Vitamin B12 deficiency    Vitamin D deficiency    Past Surgical History:  Past Surgical History:  Procedure Laterality Date   EYE  SURGERY     x2   WISDOM TOOTH EXTRACTION     Social History:  reports that she has never smoked. She has never used smokeless tobacco. She reports previous alcohol use. She reports previous drug use. Family History:  Family History  Problem Relation Age of Onset   Hypertension Mother    Anxiety disorder Mother    Other Father        unknown   Osteoarthritis Paternal Grandmother      HOME MEDICATIONS: Allergies as of 06/06/2020   No Known Allergies      Medication List        Accurate as of June 06, 2020  7:08 AM. If you have any questions, ask your nurse or doctor.          Aimovig 70 MG/ML Soaj Generic drug: Erenumab-aooe Inject into the skin every 30 (thirty) days.   ALPRAZolam 0.5 MG tablet Commonly known as: XANAX Take 0.5 mg by mouth 3 (three) times daily as needed.   Azelastine-Fluticasone 137-50 MCG/ACT Susp Place 1 spray into the nose 2 (two) times a day.   baclofen 10 MG tablet Commonly known as: LIORESAL Take 20 mg by mouth 3 (three) times daily as needed.   Botox 200 units Solr Generic drug: Botulinum Toxin Type A Inject as directed every 3 (three) months.   butalbital-acetaminophen-caffeine 50-325-40 MG tablet Commonly known as:   Name: Mahitha Hickling  MRN/ DOB: [id number], 1990/04/25    Age/ Sex: 29 y.o., female     PCP: Jonathon Jordan, MD   Reason for Endocrinology Evaluation: Combined Dyslipidemia      Initial Endocrinology Clinic Visit: 09/27/18    PATIENT IDENTIFIER: 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 She has followed with [facility] since 09/27/18 for consultative assistance with management of her Hyperlipidemia and NASH.   HISTORICAL SUMMARY:  She was diagnosed with elevated TG at age 44 , which was in 46's range, of note the patient was on OCP's at the time for years. She was started on Lovaza at the time 4 grams daily  Which improved her TG levels to 200's. LDL increased was noted in 2016. She has tried Lipitor but made her nauseous and weak, she was then switched to another statin but on her initial visit to our clinic she was on rosuvastatin.     No FH of dyslipidemia on mother side, unknown paternal history.  No FH of early death, or stoke on maternal side.   No Hx of pancreatitis   Her triglycerides improved tremendously to 176 mg/DL  with stopping the combined oral contraceptive pills.  She is S/P C-section 02/02/2020, Girl  Audry    SUBJECTIVE:    Today (06/06/2020):  Ms. Dascenzo is here for a follow up on combined hyperlipidemia.  She is S/P C-Section 02/01/2021  She is nursing  Denies nausea or vomiting  Denies abdominal pain  Denies sob but has occasional palpitations      HISTORY:  Past Medical History:  Past Medical History:  Diagnosis Date   Depression    EDS (Ehlers-Danlos syndrome)    Hyperlipidemia    Migraine    POTS (postural orthostatic tachycardia syndrome)    Transaminitis    Vitamin B12 deficiency    Vitamin D deficiency    Past Surgical History:  Past Surgical History:  Procedure Laterality Date   EYE  SURGERY     x2   WISDOM TOOTH EXTRACTION     Social History:  reports that she has never smoked. She has never used smokeless tobacco. She reports previous alcohol use. She reports previous drug use. Family History:  Family History  Problem Relation Age of Onset   Hypertension Mother    Anxiety disorder Mother    Other Father        unknown   Osteoarthritis Paternal Grandmother      HOME MEDICATIONS: Allergies as of 06/06/2020   No Known Allergies      Medication List        Accurate as of June 06, 2020  7:08 AM. If you have any questions, ask your nurse or doctor.          Aimovig 70 MG/ML Soaj Generic drug: Erenumab-aooe Inject into the skin every 30 (thirty) days.   ALPRAZolam 0.5 MG tablet Commonly known as: XANAX Take 0.5 mg by mouth 3 (three) times daily as needed.   Azelastine-Fluticasone 137-50 MCG/ACT Susp Place 1 spray into the nose 2 (two) times a day.   baclofen 10 MG tablet Commonly known as: LIORESAL Take 20 mg by mouth 3 (three) times daily as needed.   Botox 200 units Solr Generic drug: Botulinum Toxin Type A Inject as directed every 3 (three) months.   butalbital-acetaminophen-caffeine 50-325-40 MG tablet Commonly known as:  Name: Mahitha Hickling  MRN/ DOB: [id number], 1990/04/25    Age/ Sex: 29 y.o., female     PCP: Jonathon Jordan, MD   Reason for Endocrinology Evaluation: Combined Dyslipidemia      Initial Endocrinology Clinic Visit: 09/27/18    PATIENT IDENTIFIER: 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 She has followed with [facility] since 09/27/18 for consultative assistance with management of her Hyperlipidemia and NASH.   HISTORICAL SUMMARY:  She was diagnosed with elevated TG at age 44 , which was in 46's range, of note the patient was on OCP's at the time for years. She was started on Lovaza at the time 4 grams daily  Which improved her TG levels to 200's. LDL increased was noted in 2016. She has tried Lipitor but made her nauseous and weak, she was then switched to another statin but on her initial visit to our clinic she was on rosuvastatin.     No FH of dyslipidemia on mother side, unknown paternal history.  No FH of early death, or stoke on maternal side.   No Hx of pancreatitis   Her triglycerides improved tremendously to 176 mg/DL  with stopping the combined oral contraceptive pills.  She is S/P C-section 02/02/2020, Girl  Audry    SUBJECTIVE:    Today (06/06/2020):  Ms. Dascenzo is here for a follow up on combined hyperlipidemia.  She is S/P C-Section 02/01/2021  She is nursing  Denies nausea or vomiting  Denies abdominal pain  Denies sob but has occasional palpitations      HISTORY:  Past Medical History:  Past Medical History:  Diagnosis Date   Depression    EDS (Ehlers-Danlos syndrome)    Hyperlipidemia    Migraine    POTS (postural orthostatic tachycardia syndrome)    Transaminitis    Vitamin B12 deficiency    Vitamin D deficiency    Past Surgical History:  Past Surgical History:  Procedure Laterality Date   EYE  SURGERY     x2   WISDOM TOOTH EXTRACTION     Social History:  reports that she has never smoked. She has never used smokeless tobacco. She reports previous alcohol use. She reports previous drug use. Family History:  Family History  Problem Relation Age of Onset   Hypertension Mother    Anxiety disorder Mother    Other Father        unknown   Osteoarthritis Paternal Grandmother      HOME MEDICATIONS: Allergies as of 06/06/2020   No Known Allergies      Medication List        Accurate as of June 06, 2020  7:08 AM. If you have any questions, ask your nurse or doctor.          Aimovig 70 MG/ML Soaj Generic drug: Erenumab-aooe Inject into the skin every 30 (thirty) days.   ALPRAZolam 0.5 MG tablet Commonly known as: XANAX Take 0.5 mg by mouth 3 (three) times daily as needed.   Azelastine-Fluticasone 137-50 MCG/ACT Susp Place 1 spray into the nose 2 (two) times a day.   baclofen 10 MG tablet Commonly known as: LIORESAL Take 20 mg by mouth 3 (three) times daily as needed.   Botox 200 units Solr Generic drug: Botulinum Toxin Type A Inject as directed every 3 (three) months.   butalbital-acetaminophen-caffeine 50-325-40 MG tablet Commonly known as:

## 2021-02-24 ENCOUNTER — Encounter: Payer: Self-pay | Admitting: Internal Medicine

## 2021-02-24 ENCOUNTER — Ambulatory Visit: Payer: Medicaid Other | Admitting: Internal Medicine

## 2021-02-24 ENCOUNTER — Other Ambulatory Visit: Payer: Self-pay

## 2021-02-24 VITALS — BP 110/68 | HR 84 | Ht <= 58 in | Wt 185.4 lb

## 2021-02-24 DIAGNOSIS — K7581 Nonalcoholic steatohepatitis (NASH): Secondary | ICD-10-CM

## 2021-02-24 DIAGNOSIS — E782 Mixed hyperlipidemia: Secondary | ICD-10-CM | POA: Diagnosis not present

## 2021-02-24 LAB — COMPREHENSIVE METABOLIC PANEL
ALT: 61 U/L — ABNORMAL HIGH (ref 0–35)
AST: 32 U/L (ref 0–37)
Albumin: 4.5 g/dL (ref 3.5–5.2)
Alkaline Phosphatase: 88 U/L (ref 39–117)
BUN: 18 mg/dL (ref 6–23)
CO2: 26 mEq/L (ref 19–32)
Calcium: 9.9 mg/dL (ref 8.4–10.5)
Chloride: 106 mEq/L (ref 96–112)
Creatinine, Ser: 0.67 mg/dL (ref 0.40–1.20)
GFR: 116.1 mL/min (ref >60.00)
Glucose, Bld: 109 mg/dL — ABNORMAL HIGH (ref 70–99)
Potassium: 4 mEq/L (ref 3.5–5.1)
Sodium: 140 mEq/L (ref 135–145)
Total Bilirubin: 0.4 mg/dL (ref 0.2–1.2)
Total Protein: 6.9 g/dL (ref 6.0–8.3)

## 2021-02-24 LAB — LIPID PANEL
Cholesterol: 293 mg/dL — ABNORMAL HIGH (ref 0–200)
HDL: 37.2 mg/dL — ABNORMAL LOW (ref >39.00)
NonHDL: 255.36
Total CHOL/HDL Ratio: 8
Triglycerides: 314 mg/dL — ABNORMAL HIGH (ref 0.0–149.0)
VLDL: 62.8 mg/dL — ABNORMAL HIGH (ref 0.0–40.0)

## 2021-02-24 LAB — T4, FREE: Free T4: 0.83 ng/dL (ref 0.60–1.60)

## 2021-02-24 LAB — LDL CHOLESTEROL, DIRECT: Direct LDL: 215 mg/dL

## 2021-02-24 LAB — TSH: TSH: 0.68 u[IU]/mL (ref 0.35–5.50)

## 2021-02-24 NOTE — Progress Notes (Signed)
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 She has followed with [facility] since 09/27/18 for consultative assistance with management of her Hyperlipidemia and NASH.     HISTORICAL SUMMARY:  She was diagnosed with elevated TG at age 30 , which was in 800's range, of note the patient was on OCP's at the time for years. She was started on Lovaza at the time 4 grams daily  Which improved her TG levels to 200's. LDL increased was noted in 2016. She has tried Lipitor but made her nauseous and weak, she was then switched to another statin but on her initial visit to our clinic she was on rosuvastatin.     No FH of dyslipidemia on mother side, unknown paternal history.  No FH of early death, or stoke on maternal side.   No Hx of pancreatitis   Her triglycerides improved tremendously to 176 mg/DL  with stopping the combined oral contraceptive pills.  She is S/P C-section 02/02/2020, [name]  [name]    SUBJECTIVE:    Today (06/06/2020):  [name] [name] is here for a follow up on combined hyperlipidemia.     Weight stable  She is continuing to nurse  Denies abdominal pain , denies nausea  Denies palpitations  She is doing pelvic floor and ab exercise     HISTORY:  Past Medical History:  Past Medical History:  Diagnosis Date   Depression    EDS (Ehlers-Danlos syndrome)    Hyperlipidemia    Migraine    POTS (postural orthostatic tachycardia syndrome)    Transaminitis    Vitamin B12 deficiency    Vitamin D deficiency    Past Surgical History:  Past Surgical History:  Procedure Laterality  Date   EYE SURGERY     x2   WISDOM TOOTH EXTRACTION     Social History:  reports that she has never smoked. She has never used smokeless tobacco. She reports previous alcohol use. She reports previous drug use. Family History:  Family History  Problem Relation Age of Onset   Hypertension Mother    Anxiety disorder Mother    Other Father        unknown   Osteoarthritis Paternal Grandmother      HOME MEDICATIONS: Allergies as of 06/06/2020   No Known Allergies      Medication List        Accurate as of June 06, 2020  7:08 AM. If you have any questions, ask your nurse or doctor.          Aimovig 70 MG/ML Soaj Generic drug: Erenumab-aooe Inject into the skin every 30 (thirty) days.   ALPRAZolam 0.5 MG tablet Commonly known as: XANAX Take 0.5 mg by mouth 3 (three) times daily as needed.   Azelastine-Fluticasone 137-50 MCG/ACT Susp Place 1 spray into the nose 2 (two) times a day.   baclofen 10 MG tablet Commonly known as: LIORESAL Take 20 mg by mouth 3 (three) times daily as needed.   Botox 200 units Solr Generic drug: Botulinum Toxin Type A Inject as directed every 3 (three) months.   butalbital-acetaminophen-caffeine  50-325-40 MG tablet Commonly known as: FIORICET Take 1 tablet by mouth every 4 (four) hours as needed.   cetirizine 10 MG tablet Commonly known as: ZYRTEC Take 10 mg by mouth daily.   Cyanocobalamin 1000 MCG/ML Kit Inject 1 mL as directed every 30 (thirty) days.   fluticasone 50 MCG/ACT nasal spray Commonly known as: FLONASE Place 1 spray into both nostrils daily.   hydrOXYzine 50 MG tablet Commonly known as: ATARAX/VISTARIL Take 50 mg by mouth every 8 (eight) hours as needed.   ketorolac 60 MG/2ML Soln injection Commonly known as: TORADOL Inject 60 mg into the muscle once as needed.   Melatonin 1 MG Caps Take by mouth.   meloxicam 7.5 MG tablet Commonly known as: MOBIC Take 7.5 mg by mouth daily as needed.   methylphenidate 5  MG tablet Commonly known as: RITALIN Take 5 mg by mouth daily as needed.   metroNIDAZOLE 0.75 % cream Commonly known as: METROCREAM Apply 1 application topically 2 (two) times daily.   mometasone 0.1 % cream Commonly known as: ELOCON Apply 1 application topically daily as needed.   montelukast 4 MG Pack Commonly known as: SINGULAIR Take 4 mg by mouth daily as needed.   omega-3 acid ethyl esters 1 g capsule Commonly known as: LOVAZA Take 1 g by mouth daily. Take 4 capsules daily   omega-3 acid ethyl esters 1 g capsule Commonly known as: LOVAZA Take 2 g by mouth 2 (two) times a day.   ONE-A-DAY WOMENS 50 PLUS PO Take by mouth.   PRE-NATAL PO Take by mouth.   PROBIOTIC ADVANCED PO Take by mouth.   propranolol 20 MG tablet Commonly known as: INDERAL 20 mg 4 (four) times daily.   sertraline 25 MG tablet Commonly known as: ZOLOFT Take 50 mg by mouth daily.   SUMAtriptan 25 MG tablet Commonly known as: IMITREX Take 100 mg by mouth daily as needed.   tamsulosin 0.4 MG Caps capsule Commonly known as: FLOMAX Take 0.4 mg by mouth.   zolpidem 5 MG tablet Commonly known as: AMBIEN Take 10 mg by mouth at bedtime.   Zomig 5 MG nasal solution Generic drug: zolmitriptan Place 1 spray into the nose daily as needed.          OBJECTIVE:   PHYSICAL EXAM: BP 110/68 (BP Location: Left Arm, Patient Position: Sitting, Cuff Size: Small)   Pulse 84   Ht 4\' 9"  (1.448 m)   Wt 185 lb 6.4 oz (84.1 kg)   SpO2 99%   BMI 40.12 kg/m    EXAM: General: Pt appears well and is in NAD  Lungs: Clear with good BS bilat with no rales, rhonchi, or wheezes  Heart: Auscultation: RRR.  Abdomen: Normoactive bowel sounds, soft, nontender, without masses or organomegaly palpable  Extremities:  BL LE: No pretibial edema normal ROM and strength.  Mental Status: Judgment, insight: Intact Mood and affect: No depression, anxiety, or agitation     DATA REVIEWED:   Latest Reference  Range & Units 02/24/21 14:56  Sodium 135 - 145 mEq/L 140  Potassium 3.5 - 5.1 mEq/L 4.0  Chloride 96 - 112 mEq/L 106  CO2 19 - 32 mEq/L 26  Glucose 70 - 99 mg/dL 016 (H)  BUN 6 - 23 mg/dL 18  Creatinine 0.10 - 9.32 mg/dL 3.55  Calcium 8.4 - 73.2 mg/dL 9.9  Alkaline Phosphatase 39 - 117 U/L 88  Albumin 3.5 - 5.2 g/dL 4.5  AST 0 - 37 U/L 32  ALT 0 -  35 U/L 61 (H)  Total Protein 6.0 - 8.3 g/dL 6.9  Total Bilirubin 0.2 - 1.2 mg/dL 0.4  GFR >29.56 mL/min 116.10  Total CHOL/HDL Ratio  8  Cholesterol 0 - 200 mg/dL 213 (H)  HDL Cholesterol >39.00 mg/dL 08.65 (L)  Direct LDL mg/dL 784.6    Latest Reference Range & Units 02/24/21 14:56  NonHDL  255.36  Triglycerides 0.0 - 149.0 mg/dL 962.9 (H)  VLDL 0.0 - 52.8 mg/dL 41.3 (H)  (H): Data is abnormally high  06/03/2018 Alp 47 U/L  AST 90  U/L  Alt 89 U/L T. Chol 286 mg/dL TG 244 HDL 41  LDL 010 mg/dL      ASSESSMENT / PLAN / RECOMMENDATIONS:   Combined Hyperlipidemia :   - Pt has no stigmata of hypercholesterolemia - No Hx of premature CAD , CVA or pancreatitis  - Repeat labs shows worsening of LDL and Tg . Unfortunately STATIN  therapy is not recommended during nursing phase . There's limited data on Fenofibrates. I am going to again recommend STRICT  DIETARY changes again , with low fat diet , eating more fruits and vegetables  - I will offer a referral to our CDE for a refresher    2. NASH:  - ALT is trending up , will continue to encourage lifestyle changes    F/U in 6 months     Abby Raelyn Mora, MD  [facility] Endocrinology  [facility] [address] [place], [place] [address] Phone: [phone] FAX: [phone]

## 2021-02-25 ENCOUNTER — Encounter: Payer: Self-pay | Admitting: Internal Medicine

## 2021-06-05 ENCOUNTER — Other Ambulatory Visit: Payer: Self-pay | Admitting: Family Medicine

## 2021-06-05 ENCOUNTER — Ambulatory Visit
Admission: RE | Admit: 2021-06-05 | Discharge: 2021-06-05 | Disposition: A | Discharge status: Home / Self Care | Payer: Medicaid Other | Source: Ambulatory Visit | Attending: Family Medicine | Admitting: Family Medicine

## 2021-06-05 ENCOUNTER — Other Ambulatory Visit: Payer: Self-pay

## 2021-06-05 DIAGNOSIS — J9801 Acute bronchospasm: Secondary | ICD-10-CM

## 2021-10-18 ENCOUNTER — Emergency Department (HOSPITAL_BASED_OUTPATIENT_CLINIC_OR_DEPARTMENT_OTHER)
Admission: EM | Admit: 2021-10-18 | Discharge: 2021-10-18 | Disposition: A | Discharge status: Home / Self Care | Payer: Medicaid Other | Attending: Emergency Medicine | Admitting: Emergency Medicine

## 2021-10-18 ENCOUNTER — Emergency Department (HOSPITAL_COMMUNITY): Payer: Medicaid Other

## 2021-10-18 ENCOUNTER — Other Ambulatory Visit: Payer: Self-pay

## 2021-10-18 ENCOUNTER — Encounter (HOSPITAL_BASED_OUTPATIENT_CLINIC_OR_DEPARTMENT_OTHER): Payer: Self-pay

## 2021-10-18 DIAGNOSIS — R102 Pelvic and perineal pain: Secondary | ICD-10-CM | POA: Insufficient documentation

## 2021-10-18 DIAGNOSIS — Z3201 Encounter for pregnancy test, result positive: Secondary | ICD-10-CM

## 2021-10-18 DIAGNOSIS — N83201 Unspecified ovarian cyst, right side: Secondary | ICD-10-CM

## 2021-10-18 HISTORY — DX: Unspecified ovarian cyst, unspecified side: N83.209

## 2021-10-18 LAB — CBC
HCT: 42 % (ref 36.0–46.0)
Hemoglobin: 14.3 g/dL (ref 12.0–15.0)
MCH: 30.6 pg (ref 26.0–34.0)
MCHC: 34 g/dL (ref 30.0–36.0)
MCV: 89.7 fL (ref 80.0–100.0)
Platelets: 208 10*3/uL (ref 150–400)
RBC: 4.68 MIL/uL (ref 3.87–5.11)
RDW: 12.6 % (ref 11.5–15.5)
WBC: 8.5 10*3/uL (ref 4.0–10.5)
nRBC: 0 % (ref 0.0–0.2)

## 2021-10-18 LAB — BASIC METABOLIC PANEL
Anion gap: 9 (ref 5–15)
BUN: 10 mg/dL (ref 6–20)
CO2: 25 mmol/L (ref 22–32)
Calcium: 9.2 mg/dL (ref 8.9–10.3)
Chloride: 105 mmol/L (ref 98–111)
Creatinine, Ser: 0.79 mg/dL (ref 0.44–1.00)
GFR, Estimated: >60 mL/min (ref >60)
Glucose, Bld: 105 mg/dL — ABNORMAL HIGH (ref 70–99)
Potassium: 4.3 mmol/L (ref 3.5–5.1)
Sodium: 139 mmol/L (ref 135–145)

## 2021-10-18 LAB — URINALYSIS, ROUTINE W REFLEX MICROSCOPIC
Bilirubin Urine: NEGATIVE
Glucose, UA: NEGATIVE mg/dL
Hgb urine dipstick: NEGATIVE
Ketones, ur: NEGATIVE mg/dL
Leukocytes,Ua: NEGATIVE
Nitrite: NEGATIVE
Protein, ur: NEGATIVE mg/dL
Specific Gravity, Urine: 1.016 (ref 1.005–1.030)
pH: 6.5 (ref 5.0–8.0)

## 2021-10-18 LAB — TYPE AND SCREEN
ABO/RH(D): O POS
Antibody Screen: NEGATIVE

## 2021-10-18 LAB — HCG, QUANTITATIVE, PREGNANCY: hCG, Beta Chain, Quant, S: 197 m[IU]/mL — ABNORMAL HIGH (ref <5)

## 2021-10-18 LAB — PREGNANCY, URINE: Preg Test, Ur: POSITIVE — AB

## 2021-10-18 NOTE — ED Notes (Addendum)
Per MD pt does not need INT for ED to ED transfer via POV

## 2021-10-18 NOTE — Discharge Instructions (Signed)
[facility]  They should call you.  If they do not call you please call the number you are given for follow-up You will need to have a repeat pregnancy test done. If your pain returns please return for evaluation

## 2021-10-18 NOTE — ED Provider Notes (Signed)
Patient transferred from [facility] with right-sided pelvic pain and sent for ultrasound to rule out torsion.  Patient states that pain has improved somewhat.  She is resting comfortably.  Pelvic ultrasound with Doppler has been ordered.  Also, pregnancy test at [facility] was positive, quantitative hCG is ordered.  Case is signed out to Dr. Jeanell Sparrow.   Delora Fuel, MD 19/50/93 [phone]

## 2021-10-18 NOTE — ED Provider Notes (Signed)
[place] EMERGENCY DEPT  Provider Note  CSN: [id number] Arrival date & time: 10/18/21 0444  History Chief Complaint  Patient presents with  . Pelvic Pain    [name] [name] is a 31 y.o. female reports intermittent R pelvic pain for the last 2 weeks. Saw PCP and had neg blood work, seen by [employer] yesterday and had [place] she was preliminarily told showed ovarian cyst but official results not available yet. During the night, she had sudden onset of severe sharp R pelvic pain, associated with nausea and feeling lightheaded. Pain has since subsided some (10/10 to 6/10).    Home Medications Prior to Admission medications   Medication Sig Start Date End Date Taking? Authorizing Provider  ALPRAZolam Duanne Moron) 0.5 MG tablet Take 0.5 mg by mouth 3 (three) times daily as needed.  Patient not taking: Reported on 11/07/2019    [provider]  Azelastine-Fluticasone 137-50 MCG/ACT SUSP Place 1 spray into the nose 2 (two) times a day.    [provider]  baclofen (LIORESAL) 10 MG tablet Take 20 mg by mouth 3 (three) times daily as needed.  Patient not taking: Reported on 11/07/2019    [provider]  Botulinum Toxin Type A (BOTOX) 200 units SOLR Inject as directed every 3 (three) months.    [provider]  butalbital-acetaminophen-caffeine (FIORICET) 50-325-40 MG tablet Take 1 tablet by mouth every 4 (four) hours as needed. Patient not taking: Reported on 06/06/2020 10/13/19   [provider]  cetirizine (ZYRTEC) 10 MG tablet Take 10 mg by mouth daily.    [provider]  Cyanocobalamin 1000 MCG/ML KIT Inject 1 mL as directed every 30 (thirty) days.    [provider]  Erenumab-aooe (AIMOVIG) 31 MG/ML SOAJ Inject into the skin every 30 (thirty) days. Patient not taking: Reported on 11/07/2019    [provider]  fluticasone (FLONASE) 50 MCG/ACT nasal spray Place 1 spray into both nostrils daily. Patient not taking: Reported on  06/06/2020    [provider]  ketorolac (TORADOL) 60 MG/2ML SOLN injection Inject 60 mg into the muscle once as needed.  Patient not taking: Reported on 11/07/2019 10/22/14   [provider]  labetalol (NORMODYNE) 200 MG tablet TAKE 1 TABLET BY MOUTH IN THE MORNING AND 1 AND 1/2 TABS IN THE EVENING DAILY Patient not taking: Reported on 02/24/2021 10/16/20   Shamleffer, Melanie Crazier, MD  Melatonin 1 MG CAPS Take by mouth. Patient not taking: Reported on 11/07/2019    [provider]  meloxicam (MOBIC) 7.5 MG tablet Take 7.5 mg by mouth daily as needed.  Patient not taking: No sig reported 11/24/14   [provider]  methylphenidate (RITALIN) 5 MG tablet Take 5 mg by mouth daily as needed.  Patient not taking: No sig reported    [provider]  mometasone (ELOCON) 0.1 % cream Apply 1 application topically daily as needed.  Patient not taking: No sig reported    [provider]  montelukast (SINGULAIR) 4 MG PACK Take 4 mg by mouth daily as needed.    [provider]  Multiple Vitamins-Minerals (ONE-A-DAY WOMENS 50 PLUS PO) Take by mouth. Patient not taking: Reported on 11/07/2019    [provider]  omega-3 acid ethyl esters (LOVAZA) 1 g capsule Take 1 g by mouth daily. Take 4 capsules daily Patient not taking: Reported on 11/07/2019    [provider]  omega-3 acid ethyl esters (LOVAZA) 1 g capsule Take 2 g by mouth  2 (two) times a day. Patient not taking: Reported on 11/07/2019    [provider]  Prenatal Multivit-Min-Fe-FA (PRE-NATAL PO) Take by mouth.    [provider]  Probiotic Product (PROBIOTIC ADVANCED PO) Take by mouth. Patient not taking: Reported on 06/06/2020    [provider]  propranolol (INDERAL) 20 MG tablet Take 1 tablet by mouth 3 (three) times daily. 01/28/21   [provider]  sertraline (ZOLOFT) 25 MG tablet Take 50 mg by mouth daily.     [provider]   SUMAtriptan (IMITREX) 25 MG tablet Take 100 mg by mouth daily as needed.    [provider]  zolmitriptan (ZOMIG) 5 MG nasal solution Place 1 spray into the nose daily as needed.    [provider]  zolpidem (AMBIEN) 5 MG tablet Take 10 mg by mouth at bedtime.  Patient not taking: Reported on 11/07/2019    [provider]     Allergies    Amphetamine-dextroamphetamine, Fenofibrate, Tape, and Wound dressing adhesive   Review of Systems   Review of Systems Please see HPI for pertinent positives and negatives  Physical Exam BP 122/76 (BP Location: Right Arm)   Pulse 72   Temp 97.9 F (36.6 C) (Oral)   Resp 16   Ht 4' 9"  (1.448 m)   Wt 83 kg   LMP 09/27/2021 (Exact Date)   SpO2 97%   BMI 39.60 kg/m   Physical Exam Vitals and nursing note reviewed.  Constitutional:      Appearance: Normal appearance.  HENT:     Head: Normocephalic and atraumatic.     Nose: Nose normal.     Mouth/Throat:     Mouth: Mucous membranes are moist.  Eyes:     Extraocular Movements: Extraocular movements intact.     Conjunctiva/sclera: Conjunctivae normal.  Cardiovascular:     Rate and Rhythm: Normal rate.  Pulmonary:     Effort: Pulmonary effort is normal.     Breath sounds: Normal breath sounds.  Abdominal:     General: Abdomen is flat.     Palpations: Abdomen is soft.     Tenderness: There is abdominal tenderness (R pelvis, no guarding).  Musculoskeletal:        General: No swelling. Normal range of motion.     Cervical back: Neck supple.  Skin:    General: Skin is warm and dry.  Neurological:     General: No focal deficit present.     Mental Status: She is alert.  Psychiatric:        Mood and Affect: Mood normal.     ED Results / Procedures / Treatments   EKG None  Procedures Procedures  Medications Ordered in the ED Medications - No data to display  Initial Impression and Plan  Patient here with sudden onset of severe R pelvic pain in setting  of reportedly recent diagnosis of ovarian cyst. Concern for torsion. She declined pain meds as she is still breast feeding her daughter. She will need [place] to evaluate her ovary but not available here at this time. Spoke with Dr. Christy Gentles, [place] [facility] who will accept in transfer. Husband is comfortable driving her there.   ED Course   Clinical Course as of 10/18/21 0558  Sat Oct 18, 2021  0538 UA is normal.  [CS]  1062 After patient's departure, lab reported a 'very faint' positive hcg qual. Dr. Christy Gentles is aware and will address it on her arrival to [facility].  [CS]  Clinical Course User Index [CS] Truddie Hidden, MD     MDM Rules/Calculators/A&P Medical Decision Making Problems Addressed: Pelvic pain: acute illness or injury  Amount and/or Complexity of Data Reviewed Labs: ordered.    Final Clinical Impression(s) / ED Diagnoses Final diagnoses:  Pelvic pain    Rx / DC Orders ED Discharge Orders     None        Truddie Hidden, MD 10/18/21 [phone]

## 2021-10-18 NOTE — ED Provider Notes (Signed)
  Physical Exam  BP 122/86   Pulse 74   Temp 98 F (36.7 C)   Resp 16   Ht 1.448 m (4' 9" )   Wt 83 kg   LMP 09/27/2021 (Exact Date)   SpO2 99%   BMI 39.60 kg/m   Physical Exam  Procedures  Procedures  ED Course / MDM   Clinical Course as of 10/18/21 1036  Sat Oct 18, 2021  0538 UA is normal.  [CS]  0518 After patient's departure, lab reported a 'very faint' positive hcg qual. Dr. Christy Gentles is aware and will address it on her arrival to [facility].  [CS]  3358 Ultrasound performed Per radiology report no intrauterine gestational sac, yolk sac, fetal pole, or cardiac activity visualized no evidence of ovarian torsion [DR]    Clinical Course User Index [CS] Truddie Hidden, MD [DR] Pattricia Boss, MD   Medical Decision Making Amount and/or Complexity of Data Reviewed Labs: ordered. Radiology: ordered.   31 yo female sent from [facility] to evaluate for ectopic pregnancy and torsion. Patient sent from [facility].  After arrival to [facility], pregnancy test resulted positive.  Patient is hemodynamically stable. Quantitative bhcg pending Patient in [place] now  Reviewed CT with no evidence of acute abnormality Patient with quantitative beta-hCG elevated at 197 and no gestational, fetal sac, or cardiac activity noted on ultrasound This could represent patient with missed AB and decreasing or pregnancy with increasing quant's Discussed care with Dr. Elly Modena. [facility]. They will call her.  Patient advised that she should call them if she has not received call. Patient and I discussed return precautions including return of pain or worsening pain We discussed her need for follow-up and she voices understanding.     Pattricia Boss, MD 10/18/21 1037

## 2021-10-18 NOTE — ED Triage Notes (Signed)
Pelvic pain for 2 weeks, recently diagnosed with ovarian cysts.  Also complaining with nausea.

## 2021-10-20 ENCOUNTER — Other Ambulatory Visit: Payer: Self-pay

## 2021-10-20 ENCOUNTER — Ambulatory Visit (INDEPENDENT_AMBULATORY_CARE_PROVIDER_SITE_OTHER): Payer: Medicaid Other | Admitting: *Deleted

## 2021-10-20 ENCOUNTER — Telehealth: Payer: Self-pay | Admitting: Family Medicine

## 2021-10-20 ENCOUNTER — Encounter: Payer: Self-pay | Admitting: *Deleted

## 2021-10-20 VITALS — BP 116/69 | HR 86 | Ht <= 58 in | Wt 182.5 lb

## 2021-10-20 DIAGNOSIS — O3680X0 Pregnancy with inconclusive fetal viability, not applicable or unspecified: Secondary | ICD-10-CM

## 2021-10-20 LAB — I-STAT BETA HCG BLOOD, ED (MC, WL, AP ONLY): I-stat hCG, quantitative: 172.7 m[IU]/mL — ABNORMAL HIGH (ref <5)

## 2021-10-20 LAB — BETA HCG QUANT (REF LAB): hCG Quant: 33 m[IU]/mL

## 2021-10-20 NOTE — Progress Notes (Signed)
Here for Stat bhcg. States less pain today. States Saturday had pelvic pain more on right side =9, fullness and soreness. States had been having pain for a week but got worse Saturday. States Friday at [facility] has [place] and showed cyst on both ovaries but no pregnancy.  Today states no bleeding and not having pain as bad as Saturday when went to ER. States now having mild cramping=2, no soreness, no pelvic pain.  Of note is still breastfeeding. ( 7 months old).  Explained we will call her in a few hours with results and plan of care. Ectopic precautions given.  History of high risk due to POTS , goes to [facility] for follow up.  Staci Acosta 5:30 Stat bhcg results came back= 33. Reviewed with Dr. Elgie Congo orders given for non stat in one week. I called [name] [name] and did not reach her or voicemail- heard message to enter mailbox number. Will send MyChart message to patient. Will schedule non stat in one week. Staci Acosta

## 2021-10-20 NOTE — Telephone Encounter (Signed)
Called patient to schedule lab work for today or tomorrow, there was no answer to the phone call and the option to leave a voicemail was unavailable. A mychart message was also sent to the patient.

## 2021-10-21 NOTE — Progress Notes (Signed)
Patient was assessed and managed by nursing staff during this encounter. I have reviewed the chart and agree with the documentation and plan. I have also made any necessary editorial changes.  Griffin Basil, MD 10/21/2021 10:24 PM

## 2021-10-22 ENCOUNTER — Encounter: Payer: Self-pay | Admitting: *Deleted

## 2021-10-27 ENCOUNTER — Other Ambulatory Visit: Payer: Self-pay

## 2021-10-27 ENCOUNTER — Other Ambulatory Visit: Payer: Medicaid Other

## 2021-10-27 DIAGNOSIS — O039 Complete or unspecified spontaneous abortion without complication: Secondary | ICD-10-CM

## 2021-10-28 LAB — BETA HCG QUANT (REF LAB): hCG Quant: 1 m[IU]/mL

## 2022-02-25 ENCOUNTER — Ambulatory Visit
Admission: RE | Admit: 2022-02-25 | Discharge: 2022-02-25 | Disposition: A | Discharge status: Home / Self Care | Payer: Medicaid Other | Source: Ambulatory Visit | Attending: Family Medicine | Admitting: Family Medicine

## 2022-02-25 ENCOUNTER — Ambulatory Visit: Payer: Medicaid Other | Admitting: Internal Medicine

## 2022-02-25 ENCOUNTER — Other Ambulatory Visit: Payer: Self-pay | Admitting: Family Medicine

## 2022-02-25 ENCOUNTER — Encounter: Payer: Self-pay | Admitting: Internal Medicine

## 2022-02-25 VITALS — BP 118/74 | HR 93 | Ht <= 58 in | Wt 189.0 lb

## 2022-02-25 DIAGNOSIS — M549 Dorsalgia, unspecified: Secondary | ICD-10-CM

## 2022-02-25 DIAGNOSIS — E782 Mixed hyperlipidemia: Secondary | ICD-10-CM

## 2022-02-25 LAB — COMPREHENSIVE METABOLIC PANEL
ALT: 33 U/L (ref 0–35)
AST: 26 U/L (ref 0–37)
Albumin: 4.5 g/dL (ref 3.5–5.2)
Alkaline Phosphatase: 67 U/L (ref 39–117)
BUN: 14 mg/dL (ref 6–23)
CO2: 28 mEq/L (ref 19–32)
Calcium: 9.2 mg/dL (ref 8.4–10.5)
Chloride: 104 mEq/L (ref 96–112)
Creatinine, Ser: 0.64 mg/dL (ref 0.40–1.20)
GFR: 116.56 mL/min (ref >60.00)
Glucose, Bld: 99 mg/dL (ref 70–99)
Potassium: 4.1 mEq/L (ref 3.5–5.1)
Sodium: 138 mEq/L (ref 135–145)
Total Bilirubin: 0.4 mg/dL (ref 0.2–1.2)
Total Protein: 7.2 g/dL (ref 6.0–8.3)

## 2022-02-25 LAB — LIPID PANEL
Cholesterol: 266 mg/dL — ABNORMAL HIGH (ref 0–200)
HDL: 39.7 mg/dL (ref >39.00)
Total CHOL/HDL Ratio: 7
Triglycerides: 476 mg/dL — ABNORMAL HIGH (ref 0.0–149.0)

## 2022-02-25 LAB — TSH: TSH: 1.09 u[IU]/mL (ref 0.35–5.50)

## 2022-02-25 LAB — LDL CHOLESTEROL, DIRECT: Direct LDL: 170 mg/dL

## 2022-02-25 NOTE — Progress Notes (Signed)
Name: [name] [name]  MRN/ DOB: [id number], December 13, 1990    Age/ Sex: 31 y.o., female     PCP: [name] Palmer, MD   Reason for Endocrinology Evaluation: Combined Dyslipidemia      Initial Endocrinology Clinic Visit: 09/27/18    PATIENT IDENTIFIER: [name] [name] is a 31 y.o., female with a past medical history of Combined dyslipidemia, Deletion of SHOX (short stature), joint hypermobility  And NASH (sono 03/2018). She has followed with [facility] since 09/27/18 for consultative assistance with management of her Hyperlipidemia and NASH.     HISTORICAL SUMMARY:  She was diagnosed with elevated TG at age 21 , which was in 800's range, of note the patient was on OCP's at the time for years. She was started on Lovaza at the time 4 grams daily  Which improved her TG levels to 200's. LDL increased was noted in 2016. She has tried Lipitor but made her nauseous and weak, she was then switched to another statin but on her initial visit to our clinic she was on rosuvastatin.     No FH of dyslipidemia on mother side, unknown paternal history.  No FH of early death, or stoke on maternal side.   No Hx of pancreatitis   Her triglycerides improved tremendously to 176 mg/DL  with stopping the combined oral contraceptive pills.  She is S/P C-section 02/02/2020, Girl  Audry    SUBJECTIVE:    Today (06/06/2020):  [name] [name] is here for a follow up on combined hyperlipidemia.   Weight continues to fluctuate  She hurt her back while helping her niece from carousel  She  continues  to nurse  Denies abdominal pain , denies nausea  Denies palpitations    HISTORY:  Past Medical History:  Past Medical History:  Diagnosis Date   Depression    EDS (Ehlers-Danlos syndrome)    Hyperlipidemia    Migraine    POTS (postural orthostatic tachycardia syndrome)    Transaminitis    Vitamin B12 deficiency    Vitamin D deficiency    Past Surgical History:  Past Surgical History:   Procedure Laterality Date   EYE SURGERY     x2   WISDOM TOOTH EXTRACTION     Social History:  reports that she has never smoked. She has never used smokeless tobacco. She reports previous alcohol use. She reports previous drug use. Family History:  Family History  Problem Relation Age of Onset   Hypertension Mother    Anxiety disorder Mother    Other Father        unknown   Osteoarthritis Paternal Grandmother      HOME MEDICATIONS: Allergies as of 06/06/2020   No Known Allergies      Medication List        Accurate as of June 06, 2020  7:08 AM. If you have any questions, ask your nurse or doctor.          Aimovig 70 MG/ML Soaj Generic drug: Erenumab-aooe Inject into the skin every 30 (thirty) days.   ALPRAZolam 0.5 MG tablet Commonly known as: XANAX Take 0.5 mg by mouth 3 (three) times daily as needed.   Azelastine-Fluticasone 137-50 MCG/ACT Susp Place 1 spray into the nose 2 (two) times a day.   baclofen 10 MG tablet Commonly known as: LIORESAL Take 20 mg by mouth 3 (three) times daily as needed.   Botox 200 units Solr Generic drug: Botulinum Toxin Type A Inject as directed every 3 (three) months.  butalbital-acetaminophen-caffeine 50-325-40 MG tablet Commonly known as: FIORICET Take 1 tablet by mouth every 4 (four) hours as needed.   cetirizine 10 MG tablet Commonly known as: ZYRTEC Take 10 mg by mouth daily.   Cyanocobalamin 1000 MCG/ML Kit Inject 1 mL as directed every 30 (thirty) days.   fluticasone 50 MCG/ACT nasal spray Commonly known as: FLONASE Place 1 spray into both nostrils daily.   hydrOXYzine 50 MG tablet Commonly known as: ATARAX/VISTARIL Take 50 mg by mouth every 8 (eight) hours as needed.   ketorolac 60 MG/2ML Soln injection Commonly known as: TORADOL Inject 60 mg into the muscle once as needed.   Melatonin 1 MG Caps Take by mouth.   meloxicam 7.5 MG tablet Commonly known as: MOBIC Take 7.5 mg by mouth daily as  needed.   methylphenidate 5 MG tablet Commonly known as: RITALIN Take 5 mg by mouth daily as needed.   metroNIDAZOLE 0.75 % cream Commonly known as: METROCREAM Apply 1 application topically 2 (two) times daily.   mometasone 0.1 % cream Commonly known as: ELOCON Apply 1 application topically daily as needed.   montelukast 4 MG Pack Commonly known as: SINGULAIR Take 4 mg by mouth daily as needed.   omega-3 acid ethyl esters 1 g capsule Commonly known as: LOVAZA Take 1 g by mouth daily. Take 4 capsules daily   omega-3 acid ethyl esters 1 g capsule Commonly known as: LOVAZA Take 2 g by mouth 2 (two) times a day.   ONE-A-DAY WOMENS 50 PLUS PO Take by mouth.   PRE-NATAL PO Take by mouth.   PROBIOTIC ADVANCED PO Take by mouth.   propranolol 20 MG tablet Commonly known as: INDERAL 20 mg 4 (four) times daily.   sertraline 25 MG tablet Commonly known as: ZOLOFT Take 50 mg by mouth daily.   SUMAtriptan 25 MG tablet Commonly known as: IMITREX Take 100 mg by mouth daily as needed.   tamsulosin 0.4 MG Caps capsule Commonly known as: FLOMAX Take 0.4 mg by mouth.   zolpidem 5 MG tablet Commonly known as: AMBIEN Take 10 mg by mouth at bedtime.   Zomig 5 MG nasal solution Generic drug: zolmitriptan Place 1 spray into the nose daily as needed.          OBJECTIVE:   PHYSICAL EXAM: BP 118/74 (BP Location: Left Arm, Patient Position: Sitting, Cuff Size: Large)   Pulse 93   Ht 4\' 9"  (1.448 m)   Wt 189 lb (85.7 kg)   SpO2 98%   BMI 40.90 kg/m    EXAM: General: Pt appears well and is in NAD  Lungs: Clear with good BS bilat with no rales, rhonchi, or wheezes  Heart: Auscultation: RRR.  Abdomen: Normoactive bowel sounds, soft, nontender, without masses or organomegaly palpable  Extremities:  BL LE: No pretibial edema normal ROM and strength.  Mental Status: Judgment, insight: Intact Mood and affect: No depression, anxiety, or agitation     DATA  REVIEWED:   Latest Reference Range & Units 02/25/22 10:34  Sodium 135 - 145 mEq/L 138  Potassium 3.5 - 5.1 mEq/L 4.1  Chloride 96 - 112 mEq/L 104  CO2 19 - 32 mEq/L 28  Glucose 70 - 99 mg/dL 99  BUN 6 - 23 mg/dL 14  Creatinine 1.61 - 0.96 mg/dL 0.45  Calcium 8.4 - 40.9 mg/dL 9.2  Alkaline Phosphatase 39 - 117 U/L 67  Albumin 3.5 - 5.2 g/dL 4.5  AST 0 - 37 U/L 26  ALT 0 - 35  U/L 33  Total Protein 6.0 - 8.3 g/dL 7.2  Total Bilirubin 0.2 - 1.2 mg/dL 0.4  GFR >64.40 mL/min 116.56  Total CHOL/HDL Ratio  7  Cholesterol 0 - 200 mg/dL 347 (H)  HDL Cholesterol >39.00 mg/dL 42.59  Direct LDL mg/dL 563.8  Triglycerides 0.0 - 149.0 mg/dL 756.4 Triglyceride is over 400; calculations on Lipids are invalid. (H)    Latest Reference Range & Units 02/25/22 10:34  TSH 0.35 - 5.50 uIU/mL 1.09     06/03/2018 Alp 47 U/L  AST 90  U/L  Alt 89 U/L T. Chol 286 mg/dL TG 332 HDL 41  LDL 951 mg/dL      ASSESSMENT / PLAN / RECOMMENDATIONS:   Combined Hyperlipidemia :   - Pt has no stigmata of hypercholesterolemia - No Hx of premature CAD , CVA or pancreatitis  - Repeat labs shows improvement of LDL but Tg is elevated, part of this is due to the fact that she was not fasting when labs were drawn.Marland Kitchen Unfortunately STATIN  therapy is not recommended during nursing phase . There's limited data on Fenofibrates. I am going to again recommend STRICT  DIETARY changes again , with low fat diet , eating more fruits and vegetables and fatty fish    F/U in 1 yr    Abby Raelyn Mora, MD  [facility]  [facility] [address] [place], [place] [address] Phone: [phone] FAX: [phone]

## 2022-03-24 ENCOUNTER — Other Ambulatory Visit: Payer: Self-pay

## 2022-03-24 ENCOUNTER — Ambulatory Visit: Payer: Medicaid Other | Attending: Family Medicine

## 2022-03-24 DIAGNOSIS — M6281 Muscle weakness (generalized): Secondary | ICD-10-CM

## 2022-03-24 DIAGNOSIS — R252 Cramp and spasm: Secondary | ICD-10-CM

## 2022-03-24 DIAGNOSIS — R262 Difficulty in walking, not elsewhere classified: Secondary | ICD-10-CM

## 2022-03-24 DIAGNOSIS — M549 Dorsalgia, unspecified: Secondary | ICD-10-CM | POA: Diagnosis present

## 2022-03-24 DIAGNOSIS — M5459 Other low back pain: Secondary | ICD-10-CM

## 2022-03-24 NOTE — Therapy (Signed)
OUTPATIENT PHYSICAL THERAPY THORACOLUMBAR EVALUATION   Patient Name: [name] [name] MRN: [id number] DOB:1990/11/07, 31 y.o., female Today's Date: 03/24/2022  END OF SESSION:  PT End of Session - 03/24/22 1020     Visit Number 1    Date for PT Re-Evaluation 05/19/22    Authorization Type [place] MEDICA[employer]PAID HEALTH PLAN    PT Start Time 1021    PT Stop Time 1100    PT Time Calculation (min) 39 min    Activity Tolerance Patient tolerated treatment well    Behavior During Therapy WFL for tasks assessed/performed             Past Medical History:  Diagnosis Date   Depression    EDS (Ehlers-Danlos syndrome)    Hyperlipidemia    Migraine    Ovarian cyst    POTS (postural orthostatic tachycardia syndrome)    Transaminitis    Vitamin B12 deficiency    Vitamin D deficiency    Past Surgical History:  Procedure Laterality Date   EYE SURGERY     x2   WISDOM TOOTH EXTRACTION     Patient Active Problem List   Diagnosis Date Noted   Primary hypertension 06/07/2020   NASH (nonalcoholic steatohepatitis) 06/26/2019   Combined hyperlipidemia 06/26/2019    PCP: Mila Palmer, MD   REFERRING PROVIDER: Camie Patience, FNP  REFERRING DIAG:  Diagnosis  M54.9 (ICD-10-CM) - Dorsalgia, unspecified    Rationale for Evaluation and Treatment: Rehabilitation  THERAPY DIAG:  Other low back pain - Plan: PT plan of care cert/re-cert  Muscle weakness (generalized) - Plan: PT plan of care cert/re-cert  Cramp and spasm - Plan: PT plan of care cert/re-cert  Difficulty in walking, not elsewhere classified - Plan: PT plan of care cert/re-cert  ONSET DATE: 02/25/2022  SUBJECTIVE:                                                                                                                                                                                           SUBJECTIVE STATEMENT: Patient states she has frequent headaches and upper back and right side neck pain.  She recalls  that this got worse right after having her daughter.  She had severe pre-eclampsia with her dtrs birth and feels she got really out of shape and has not really recovered from all of that.  She feels like caring for her dtr and all the lifting contributes to her pain.  She denies any regular exercise.  Had some hip pain previously and had to stop exercising.  She feels that she hasn't returned to exercise regularly since that time. She had a back injury  lifting her niece off the carousel and felt a sharp pain.  She took several meds and this finally resolved.  She typically goes to the chiropractor but she is out of visits for the year.  She has underlying POTS (postural orthostatic tachycardia syndrome).  She went through cardiac rehab previously but has not tried to resume exercises since having her dtr due to not wanting to pass out while it is just her and her dtr.  She hopes to learn how to manage her neck pain and be able to resume some level of exercise.    PERTINENT HISTORY:  She has underlying POTS (postural orthostatic tachycardia syndrome)  PAIN:  Are you having pain? Yes: NPRS scale: 3/10 Pain location: Neck right side Pain description: tightness, aching Aggravating factors: lifting her dtr Relieving factors: ice,heat, meds, had her husband press on her back  PRECAUTIONS: None  WEIGHT BEARING RESTRICTIONS: No  FALLS:  Has patient fallen in last 6 months? Yes. Number of falls 1 (fainted last weekend) due to POTS (postural orthostatic tachycardia syndrome)  LIVING ENVIRONMENT: Lives with: lives with their family Lives in: House/apartment  OCCUPATION: stay at home Mom  PLOF: Independent, Independent with basic ADLs, Independent with household mobility without device, Independent with community mobility without device, Independent with homemaking with ambulation, Independent with gait, and Independent with transfers  PATIENT GOALS: She hopes to learn how to manage her neck pain and  be able to resume some level of exercise.    NEXT MD VISIT: prn  OBJECTIVE:   DIAGNOSTIC FINDINGS:  Xrays normal per patient  PATIENT SURVEYS:  03/24/22- NDI: 32/50 (64% disability)  SCREENING FOR RED FLAGS: Bowel or bladder incontinence: No Spinal tumors: No Cauda equina syndrome: No Compression fracture: No Abdominal aneurysm: No  COGNITION: Overall cognitive status: Within functional limits for tasks assessed     SENSATION: WFL   POSTURE: rounded shoulders  PALPATION: Tender to palpation and trigger points along the suboccipital area and upper trap/rhomboids area   CERVICAL ROM:  Hyper mobile c spine MD is checking her for EDS Carylon Perches Danlos Syndrome)   LOWER EXTREMITY ROM:     WFL  UPPER EXTREMITY MMT:    5- to 5/5 throughout  CERVICAL SPECIAL TESTS:  Spurlings negative  FUNCTIONAL TESTS:  5 times sit to stand: 10.0 sec Timed up and go (TUG): 7.99 sec  GAIT: Distance walked: 30 Assistive device utilized: None Level of assistance: Complete Independence Comments: na  TODAY'S TREATMENT:                                                                                                                              DATE: 03/24/22 Initial eval completed and initiated HEP  Dry needling handout provided    PATIENT EDUCATION:  Education details: Initiated HEP and educated on lumbar spine anatomy and natural degenerative changes Person educated: Patient Education method: Programmer, multimedia, Facilities manager, Verbal cues, and Handouts Education comprehension: verbalized understanding, returned demonstration, and  verbal cues required  HOME EXERCISE PROGRAM: Access Code: [email or web address] URL: [email or web address] Date: 03/24/2022 Prepared by: Mikey Kirschner  Exercises - Scapular Retraction with Resistance Advanced  - 2 x daily - 7 x weekly - 2 sets - 10 reps - Scapular Retraction with Resistance  - 2 x daily - 7 x weekly - 2 sets - 10 reps - Shoulder  External Rotation and Scapular Retraction with Resistance  - 2 x daily - 7 x weekly - 2 sets - 10 reps - Standing Shoulder Horizontal Abduction with Resistance  - 1 x daily - 7 x weekly - 2 sets - 10 reps   ASSESSMENT:  CLINICAL IMPRESSION: Patient is a 31 y.o. female who was seen today for physical therapy evaluation and treatment for low back pain. She presents with hypermobility and instability surrounding the cervical spine along with tight bands and trigger points along the right upper trap and parascapular areas.  UE strength was fairly normal and no shoulder ROM issues.  If anything both shoulders are also hypermobile.  She would benefit from stabilization of the c and t spine along with soft tissue mobilization and dry needling to reduce trigger points and tight bands.    OBJECTIVE IMPAIRMENTS: decreased strength, increased fascial restrictions, increased muscle spasms, impaired tone, impaired UE functional use, postural dysfunction, and pain.   ACTIVITY LIMITATIONS: carrying, lifting, sleeping, dressing, and caring for others  PARTICIPATION LIMITATIONS: meal prep, cleaning, laundry, driving, shopping, occupation, and yard work  PERSONAL FACTORS: Fitness, Past/current experiences, Time since onset of injury/illness/exacerbation, and 3+ comorbidities: depression, EDS, POTS  are also affecting patient's functional outcome.   REHAB POTENTIAL: Fair multiple co-morbidities  CLINICAL DECISION MAKING: Evolving/moderate complexity  EVALUATION COMPLEXITY: Moderate   GOALS: Goals reviewed with patient? Yes  SHORT TERM GOALS: Target date: 04/21/2022    Pain report to be no greater than 4/10  Baseline: Goal status: INITIAL  2.  Patient will be independent with initial HEP  Baseline:  Goal status: INITIAL   LONG TERM GOALS: Target date: 05/19/2022    Patient to report pain no greater than 2/10  Baseline:  Goal status: INITIAL  2.  Patient to be independent with advanced HEP   Baseline:  Goal status: INITIAL  3.  Patient FOTO score to improve to predicted score Baseline:  Goal status: INITIAL  4.  Patient to report 85% improvement in overall symptoms  Baseline:  Goal status: INITIAL  5.  Functional scores to improve by 2-3 seconds each Baseline:  Goal status: INITIAL  6.  Patient to be able to resume some level of consistent exercise. Baseline:  Goal status: INITIAL  PLAN:  PT FREQUENCY: 1-2x/week  PT DURATION: 8 weeks  PLANNED INTERVENTIONS: Therapeutic exercises, Therapeutic activity, Neuromuscular re-education, Balance training, Gait training, Patient/Family education, Self Care, Joint mobilization, Stair training, Aquatic Therapy, Dry Needling, Electrical stimulation, Spinal mobilization, Cryotherapy, Moist heat, scar mobilization, Taping, Traction, Ultrasound, Ionotophoresis 4mg /ml Dexamethasone, Manual therapy, and Re-evaluation.  PLAN FOR NEXT SESSION: Print HEP handout (medbridge down while patient was here) Review HEP, begin core strengthening, DN if patient agrees and is indicated.    Victorino Dike B. Alizea Pell, PT 03/24/22 2:36 PM  [facility] [address], [address] [place], [place] [address] Phone # [phone] Fax [phone]

## 2022-03-31 ENCOUNTER — Ambulatory Visit: Payer: Medicaid Other

## 2022-03-31 DIAGNOSIS — R262 Difficulty in walking, not elsewhere classified: Secondary | ICD-10-CM

## 2022-03-31 DIAGNOSIS — M5459 Other low back pain: Secondary | ICD-10-CM

## 2022-03-31 DIAGNOSIS — R252 Cramp and spasm: Secondary | ICD-10-CM

## 2022-03-31 DIAGNOSIS — M6281 Muscle weakness (generalized): Secondary | ICD-10-CM

## 2022-03-31 DIAGNOSIS — M549 Dorsalgia, unspecified: Secondary | ICD-10-CM | POA: Diagnosis not present

## 2022-03-31 IMAGING — CR DG CHEST 2V
2 series · 2 of 2 positions shown · non-contrast
Comparison: None.

CLINICAL DATA: History of COVID in March 2021 with post
infectious bronchospasm.

EXAM:
CHEST - 2 VIEW

[w chest pa]
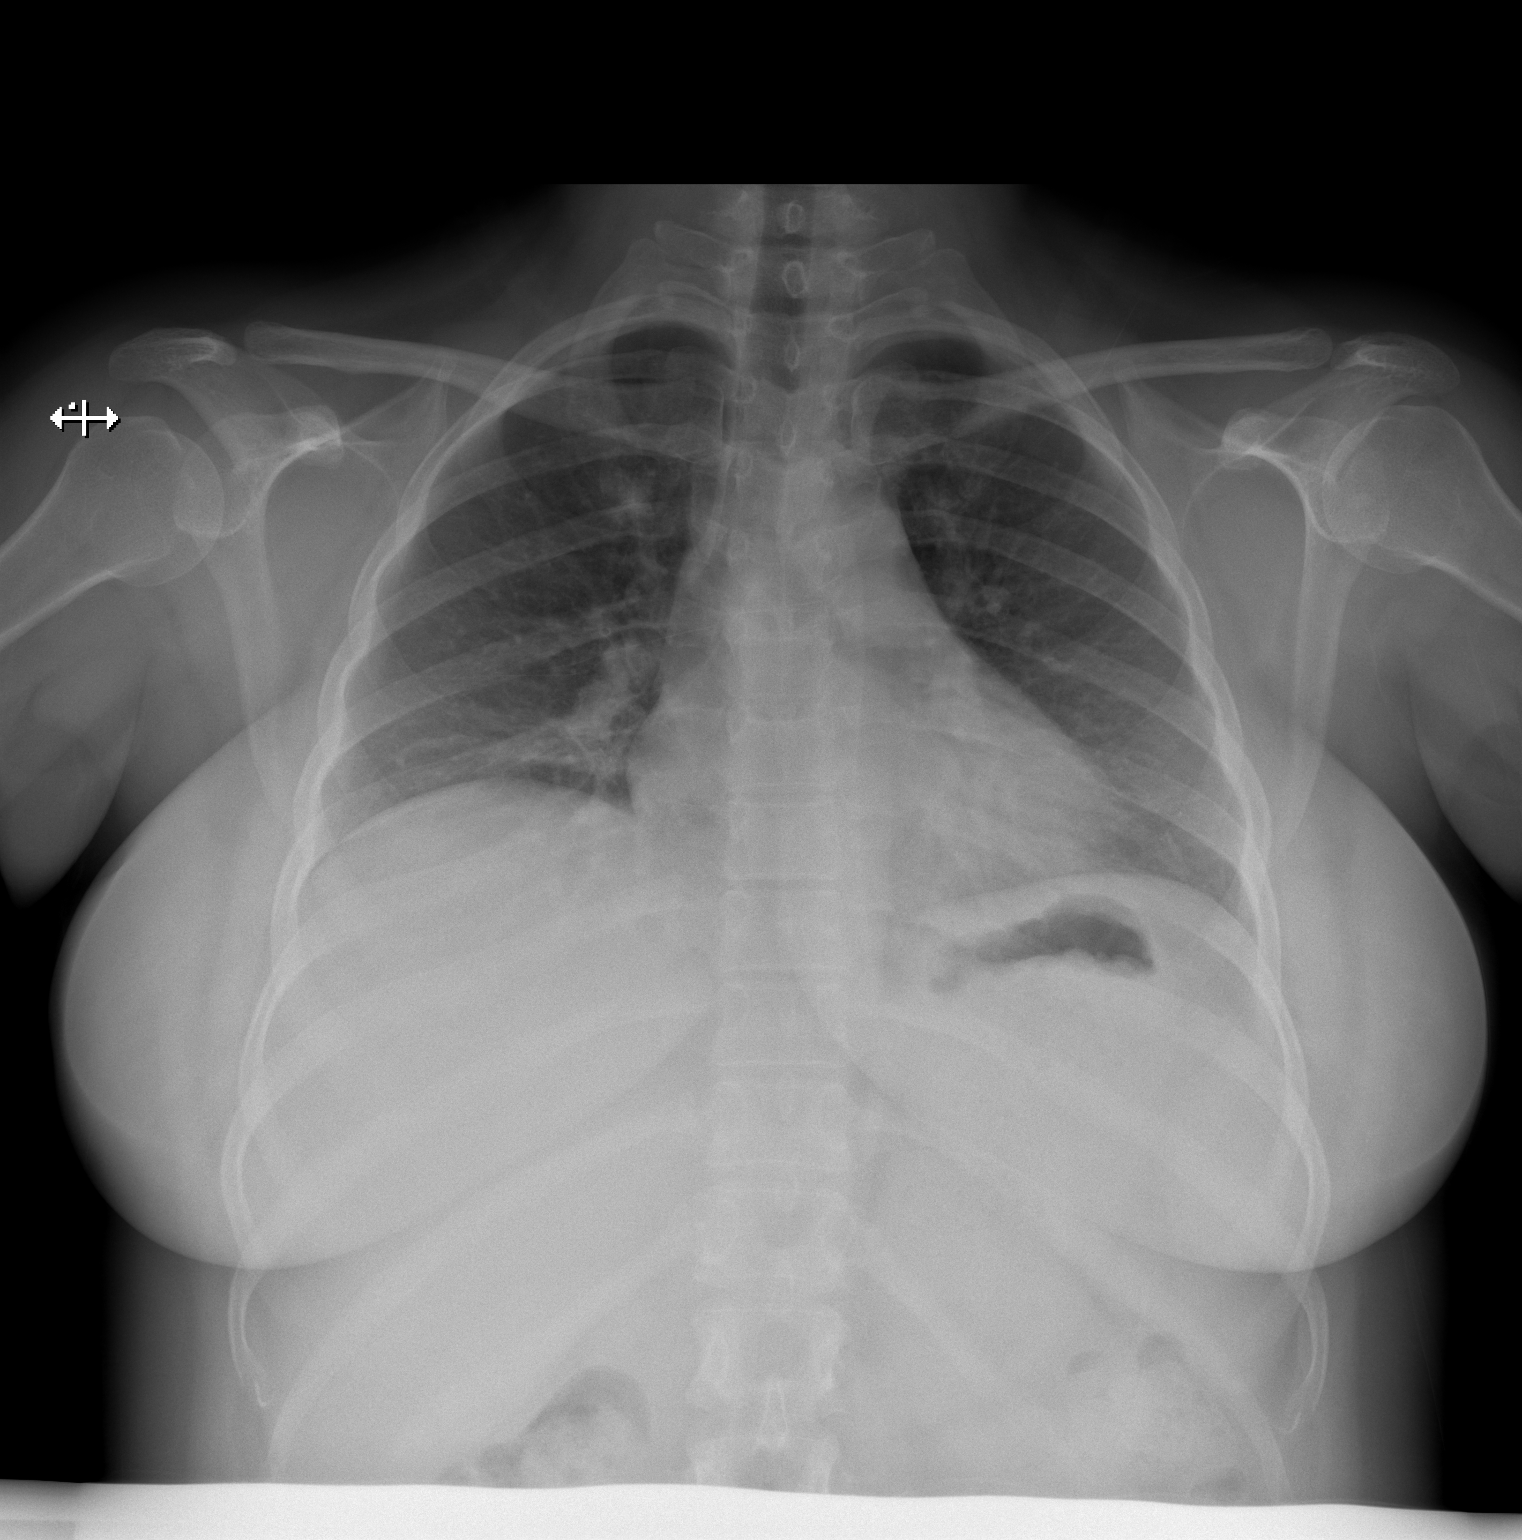

[w chest lat]
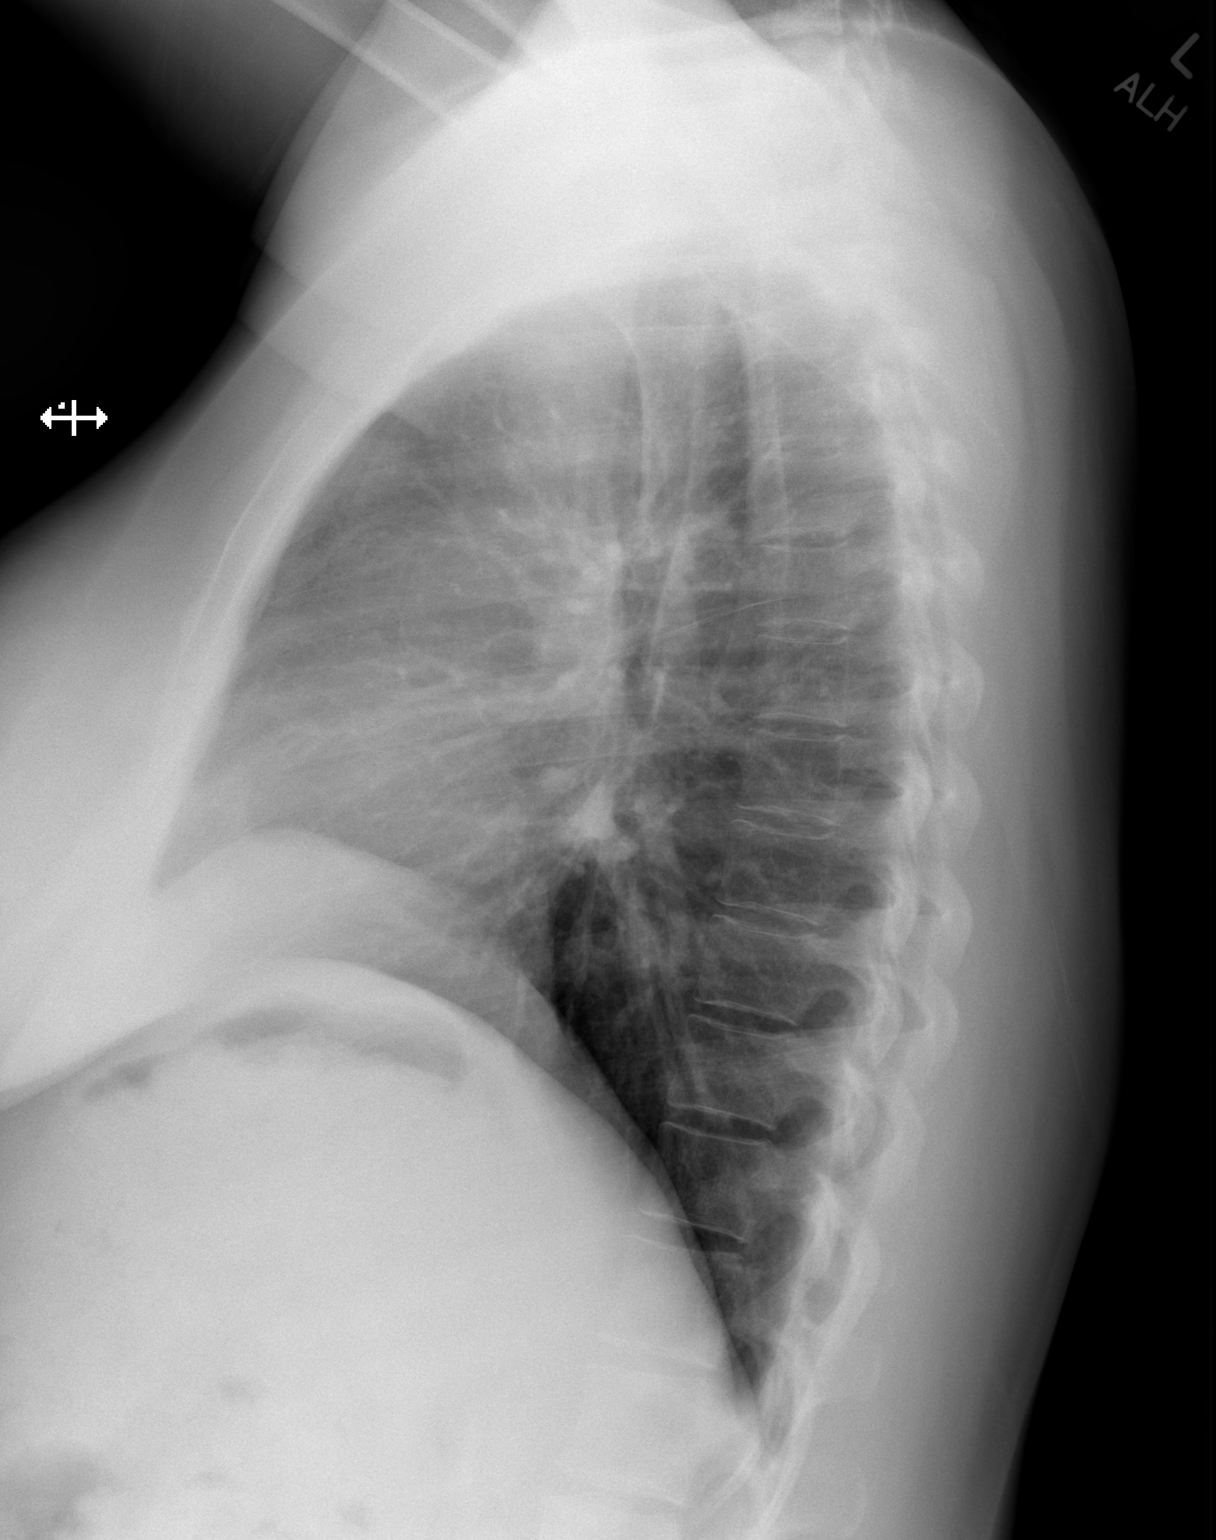

[2 of 2 positions shown; findings below may reference images not displayed]

FINDINGS: Low lung volumes are seen, likely secondary to the degree of patient
inspiration. Mildly increased suprahilar lung markings are noted,
bilaterally. Mild atelectasis and/or infiltrate is seen within the
infrahilar regions, bilaterally. There is no evidence of a pleural
effusion or pneumothorax. The heart size and mediastinal contours
are within normal limits. The visualized skeletal structures are
unremarkable.
IMPRESSION: Low lung volumes with mild bilateral infrahilar atelectasis and/or
infiltrate. Given the associated increased suprahilar lung markings,
sequelae associated with viral bronchitis cannot be excluded.

## 2022-03-31 NOTE — Therapy (Signed)
OUTPATIENT PHYSICAL THERAPY THORACOLUMBAR TREATMENT NOTE   Patient Name: [name] [name] MRN: [id number] DOB:12-12-1990, 31 y.o., female Today's Date: 03/31/2022  END OF SESSION:  PT End of Session - 03/31/22 1235     Visit Number 2    Number of Visits 10    Date for PT Re-Evaluation 05/19/22    Authorization Type [place] MEDICAID PREPAID HEALTH PLAN    Authorization Time Period [employer] Approved 9 visits, 03/24/2022-05/22/2021-auth#[id number] (10 visits total including eval)    Authorization - Visit Number 2    Authorization - Number of Visits 10    Progress Note Due on Visit 10    PT Start Time 1230    PT Stop Time 1310    PT Time Calculation (min) 40 min    Activity Tolerance Patient tolerated treatment well    Behavior During Therapy WFL for tasks assessed/performed             Past Medical History:  Diagnosis Date   Depression    EDS (Ehlers-Danlos syndrome)    Hyperlipidemia    Migraine    Ovarian cyst    POTS (postural orthostatic tachycardia syndrome)    Transaminitis    Vitamin B12 deficiency    Vitamin D deficiency    Past Surgical History:  Procedure Laterality Date   EYE SURGERY     x2   WISDOM TOOTH EXTRACTION     Patient Active Problem List   Diagnosis Date Noted   Primary hypertension 06/07/2020   NASH (nonalcoholic steatohepatitis) 06/26/2019   Combined hyperlipidemia 06/26/2019    PCP: Mila Palmer, MD   REFERRING PROVIDER: Camie Patience, FNP  REFERRING DIAG:  Diagnosis  M54.9 (ICD-10-CM) - Dorsalgia, unspecified    Rationale for Evaluation and Treatment: Rehabilitation  THERAPY DIAG:  Other low back pain  Muscle weakness (generalized)  Cramp and spasm  Difficulty in walking, not elsewhere classified  ONSET DATE: 02/25/2022  SUBJECTIVE:                                                                                                                                                                                            SUBJECTIVE STATEMENT: 03/31/22: Patient states she did ok after first visit.  Pain level 3/10.    Initial eval: Patient states she has frequent headaches and upper back and right side neck pain.  She recalls that this got worse right after having her daughter.  She had severe pre-eclampsia with her dtrs birth and feels she got really out of shape and has not really recovered from all of that.  She feels like caring for her dtr and all the  lifting contributes to her pain.  She denies any regular exercise.  Had some hip pain previously and had to stop exercising.  She feels that she hasn't returned to exercise regularly since that time. She had a back injury lifting her niece off the carousel and felt a sharp pain.  She took several meds and this finally resolved.  She typically goes to the chiropractor but she is out of visits for the year.  She has underlying POTS (postural orthostatic tachycardia syndrome).  She went through cardiac rehab previously but has not tried to resume exercises since having her dtr due to not wanting to pass out while it is just her and her dtr.  She hopes to learn how to manage her neck pain and be able to resume some level of exercise.    PERTINENT HISTORY:  She has underlying POTS (postural orthostatic tachycardia syndrome)  PAIN:  Are you having pain? Yes: NPRS scale: 3/10 Pain location: Neck right side Pain description: tightness, aching Aggravating factors: lifting her dtr Relieving factors: ice,heat, meds, had her husband press on her back  PRECAUTIONS: None  WEIGHT BEARING RESTRICTIONS: No  FALLS:  Has patient fallen in last 6 months? Yes. Number of falls 1 (fainted last weekend) due to POTS (postural orthostatic tachycardia syndrome)  LIVING ENVIRONMENT: Lives with: lives with their family Lives in: House/apartment  OCCUPATION: stay at home Mom  PLOF: Independent, Independent with basic ADLs, Independent with household mobility without device,  Independent with community mobility without device, Independent with homemaking with ambulation, Independent with gait, and Independent with transfers  PATIENT GOALS: She hopes to learn how to manage her neck pain and be able to resume some level of exercise.    NEXT MD VISIT: prn  OBJECTIVE:   DIAGNOSTIC FINDINGS:  Xrays normal per patient  PATIENT SURVEYS:  03/24/22- NDI: 32/50 (64% disability)  SCREENING FOR RED FLAGS: Bowel or bladder incontinence: No Spinal tumors: No Cauda equina syndrome: No Compression fracture: No Abdominal aneurysm: No  COGNITION: Overall cognitive status: Within functional limits for tasks assessed     SENSATION: WFL   POSTURE: rounded shoulders  PALPATION: Tender to palpation and trigger points along the suboccipital area and upper trap/rhomboids area   CERVICAL ROM:  Hyper mobile c spine MD is checking her for EDS Carylon Perches Danlos Syndrome)   LOWER EXTREMITY ROM:     WFL  UPPER EXTREMITY MMT:    5- to 5/5 throughout  CERVICAL SPECIAL TESTS:  Spurlings negative  FUNCTIONAL TESTS:  5 times sit to stand: 10.0 sec Timed up and go (TUG): 7.99 sec  GAIT: Distance walked: 30 Assistive device utilized: None Level of assistance: Complete Independence Comments: na  TODAY'S TREATMENT:                                                                                                                              DATE: 03/31/22 Nustep x 5 min level 5 (PT present to  discuss goals and status) Tband HEP review: (red band) Standing shoulder ext, rows, bilateral shoulder ER and horizontal abd x 20 each Trigger Point Dry-Needling  Treatment instructions: Expect mild to moderate muscle soreness. S/S of pneumothorax if dry needled over a lung field, and to seek immediate medical attention should they occur. Patient verbalized understanding of these instructions and education.  Patient Consent Given: Yes Education handout provided: Yes Muscles  treated: bilateral thoracic and cervical multifidi, bilateral splenius captitus and cervicis, bilateral upper traps  Electrical stimulation performed: No Parameters: N/A Treatment response/outcome: skilled palpation used to assess for trigger points and tight bands in the cervical, thoracic and lumbar spine.  Once identified, dry needling techniques used to treat these areas.  Palpable muscle elongation and twitch response noted in the thoracic and cervical multifidi and upper traps.  Patient reported mild soreness post treatment.     DATE: 03/24/22 Initial eval completed and initiated HEP  Dry needling handout provided    PATIENT EDUCATION:  Education details: Initiated HEP and educated on lumbar spine anatomy and natural degenerative changes Person educated: Patient Education method: Programmer, multimedia, Facilities manager, Verbal cues, and Handouts Education comprehension: verbalized understanding, returned demonstration, and verbal cues required  HOME EXERCISE PROGRAM: Access Code: [email or web address] URL: [email or web address] Date: 03/24/2022 Prepared by: Mikey Kirschner  Exercises - Scapular Retraction with Resistance Advanced  - 2 x daily - 7 x weekly - 2 sets - 10 reps - Scapular Retraction with Resistance  - 2 x daily - 7 x weekly - 2 sets - 10 reps - Shoulder External Rotation and Scapular Retraction with Resistance  - 2 x daily - 7 x weekly - 2 sets - 10 reps - Standing Shoulder Horizontal Abduction with Resistance  - 1 x daily - 7 x weekly - 2 sets - 10 reps   ASSESSMENT:  CLINICAL IMPRESSION: Kenijah was able to complete all exercises with good technique and tolerated dry needling with no issues or adverse effects.  She reported mild soreness in right upper trap which is where she had a good twitch respose.  She was reminded of soreness window  and advised to use ice if needed.    OBJECTIVE IMPAIRMENTS: decreased strength, increased fascial restrictions, increased muscle spasms,  impaired tone, impaired UE functional use, postural dysfunction, and pain.   ACTIVITY LIMITATIONS: carrying, lifting, sleeping, dressing, and caring for others  PARTICIPATION LIMITATIONS: meal prep, cleaning, laundry, driving, shopping, occupation, and yard work  PERSONAL FACTORS: Fitness, Past/current experiences, Time since onset of injury/illness/exacerbation, and 3+ comorbidities: depression, EDS, POTS  are also affecting patient's functional outcome.   REHAB POTENTIAL: Fair multiple co-morbidities  CLINICAL DECISION MAKING: Evolving/moderate complexity  EVALUATION COMPLEXITY: Moderate   GOALS: Goals reviewed with patient? Yes  SHORT TERM GOALS: Target date: 04/21/2022    Pain report to be no greater than 4/10  Baseline: Goal status: INITIAL  2.  Patient will be independent with initial HEP  Baseline:  Goal status: INITIAL   LONG TERM GOALS: Target date: 05/19/2022    Patient to report pain no greater than 2/10  Baseline:  Goal status: INITIAL  2.  Patient to be independent with advanced HEP  Baseline:  Goal status: INITIAL  3.  Patient FOTO score to improve to predicted score Baseline:  Goal status: INITIAL  4.  Patient to report 85% improvement in overall symptoms  Baseline:  Goal status: INITIAL  5.  Functional scores to improve by 2-3 seconds each Baseline:  Goal status: INITIAL  6.  Patient  to be able to resume some level of consistent exercise. Baseline:  Goal status: INITIAL  PLAN:  PT FREQUENCY: 1-2x/week  PT DURATION: 8 weeks  PLANNED INTERVENTIONS: Therapeutic exercises, Therapeutic activity, Neuromuscular re-education, Balance training, Gait training, Patient/Family education, Self Care, Joint mobilization, Stair training, Aquatic Therapy, Dry Needling, Electrical stimulation, Spinal mobilization, Cryotherapy, Moist heat, scar mobilization, Taping, Traction, Ultrasound, Ionotophoresis 4mg /ml Dexamethasone, Manual therapy, and  Re-evaluation.  PLAN FOR NEXT SESSION: NuStep (take precaution for POTS),  Begin core strengthening   Atley Neubert B. Tiffinie Caillier, PT 03/31/22 3:14 PM  [facility] [address] [place], [place] [address] Phone # [phone] Fax [phone]

## 2022-04-02 ENCOUNTER — Ambulatory Visit: Payer: Medicaid Other

## 2022-04-02 DIAGNOSIS — R262 Difficulty in walking, not elsewhere classified: Secondary | ICD-10-CM

## 2022-04-02 DIAGNOSIS — M549 Dorsalgia, unspecified: Secondary | ICD-10-CM | POA: Diagnosis not present

## 2022-04-02 DIAGNOSIS — M6281 Muscle weakness (generalized): Secondary | ICD-10-CM

## 2022-04-02 DIAGNOSIS — M5459 Other low back pain: Secondary | ICD-10-CM

## 2022-04-02 DIAGNOSIS — R252 Cramp and spasm: Secondary | ICD-10-CM

## 2022-04-02 NOTE — Therapy (Signed)
OUTPATIENT PHYSICAL THERAPY THORACOLUMBAR TREATMENT NOTE   Patient Name: [name] [name] MRN: [id number] DOB:01/14/1991, 31 y.o., female Today's Date: 04/02/2022  END OF SESSION:  PT End of Session - 04/02/22 1015     Visit Number 3    Number of Visits 10    Date for PT Re-Evaluation 05/19/22    Authorization Type [place] MEDICAID PREPAID HEALTH PLAN    Authorization Time Period [employer] Approved 9 visits, 03/24/2022-05/22/2021-auth#[id number] (10 visits total including eval)    Authorization - Visit Number 3    Authorization - Number of Visits 10    Progress Note Due on Visit 10    PT Start Time 1016    PT Stop Time 1100    PT Time Calculation (min) 44 min    Activity Tolerance Patient tolerated treatment well    Behavior During Therapy WFL for tasks assessed/performed             Past Medical History:  Diagnosis Date   Depression    EDS (Ehlers-Danlos syndrome)    Hyperlipidemia    Migraine    Ovarian cyst    POTS (postural orthostatic tachycardia syndrome)    Transaminitis    Vitamin B12 deficiency    Vitamin D deficiency    Past Surgical History:  Procedure Laterality Date   EYE SURGERY     x2   WISDOM TOOTH EXTRACTION     Patient Active Problem List   Diagnosis Date Noted   Primary hypertension 06/07/2020   NASH (nonalcoholic steatohepatitis) 06/26/2019   Combined hyperlipidemia 06/26/2019    PCP: Mila Palmer, MD   REFERRING PROVIDER: Camie Patience, FNP  REFERRING DIAG:  Diagnosis  M54.9 (ICD-10-CM) - Dorsalgia, unspecified    Rationale for Evaluation and Treatment: Rehabilitation  THERAPY DIAG:  Other low back pain  Muscle weakness (generalized)  Cramp and spasm  Difficulty in walking, not elsewhere classified  ONSET DATE: 02/25/2022  SUBJECTIVE:                                                                                                                                                                                            SUBJECTIVE STATEMENT: 12/28:  Patient states she is having some pain in the right sub scap area today.  I tried to use tennis ball behind me on the wall and my husband tried to massage it out as well but it is still really hurting and tight.      Initial eval: Patient states she has frequent headaches and upper back and right side neck pain.  She recalls that this got worse right after having her daughter.  She  had severe pre-eclampsia with her dtrs birth and feels she got really out of shape and has not really recovered from all of that.  She feels like caring for her dtr and all the lifting contributes to her pain.  She denies any regular exercise.  Had some hip pain previously and had to stop exercising.  She feels that she hasn't returned to exercise regularly since that time. She had a back injury lifting her niece off the carousel and felt a sharp pain.  She took several meds and this finally resolved.  She typically goes to the chiropractor but she is out of visits for the year.  She has underlying POTS (postural orthostatic tachycardia syndrome).  She went through cardiac rehab previously but has not tried to resume exercises since having her dtr due to not wanting to pass out while it is just her and her dtr.  She hopes to learn how to manage her neck pain and be able to resume some level of exercise.    PERTINENT HISTORY:  She has underlying POTS (postural orthostatic tachycardia syndrome)  PAIN:  Are you having pain? Yes: NPRS scale: 3/10 Pain location: Neck right side Pain description: tightness, aching Aggravating factors: lifting her dtr Relieving factors: ice,heat, meds, had her husband press on her back  PRECAUTIONS: None  WEIGHT BEARING RESTRICTIONS: No  FALLS:  Has patient fallen in last 6 months? Yes. Number of falls 1 (fainted last weekend) due to POTS (postural orthostatic tachycardia syndrome)  LIVING ENVIRONMENT: Lives with: lives with their family Lives in:  House/apartment  OCCUPATION: stay at home Mom  PLOF: Independent, Independent with basic ADLs, Independent with household mobility without device, Independent with community mobility without device, Independent with homemaking with ambulation, Independent with gait, and Independent with transfers  PATIENT GOALS: She hopes to learn how to manage her neck pain and be able to resume some level of exercise.    NEXT MD VISIT: prn  OBJECTIVE:   DIAGNOSTIC FINDINGS:  Xrays normal per patient  PATIENT SURVEYS:  03/24/22- NDI: 32/50 (64% disability)  SCREENING FOR RED FLAGS: Bowel or bladder incontinence: No Spinal tumors: No Cauda equina syndrome: No Compression fracture: No Abdominal aneurysm: No  COGNITION: Overall cognitive status: Within functional limits for tasks assessed     SENSATION: WFL   POSTURE: rounded shoulders  PALPATION: Tender to palpation and trigger points along the suboccipital area and upper trap/rhomboids area   CERVICAL ROM:  Hyper mobile c spine MD is checking her for EDS Carylon Perches Danlos Syndrome)   LOWER EXTREMITY ROM:     WFL  UPPER EXTREMITY MMT:    5- to 5/5 throughout  CERVICAL SPECIAL TESTS:  Spurlings negative  FUNCTIONAL TESTS:  5 times sit to stand: 10.0 sec Timed up and go (TUG): 7.99 sec  GAIT: Distance walked: 30 Assistive device utilized: None Level of assistance: Complete Independence Comments: na  TODAY'S TREATMENT:  DATE: 04/02/22 Nustep x 5 min level 5 (PT present to discuss goals and status) Tband HEP review: (red band) Standing shoulder ext, rows, bilateral shoulder ER and horizontal abd x 20 each 4 D ball rolls on wall with light blue plyo ball Educated on possible right shoulder hypermobility contributing to her neck and thoracic symptoms. Trigger Point Dry-Needling  Treatment instructions:  Expect mild to moderate muscle soreness. S/S of pneumothorax if dry needled over a lung field, and to seek immediate medical attention should they occur. Patient verbalized understanding of these instructions and education.  Patient Consent Given: Yes Education handout provided: Yes Muscles treated: bilateral thoracic and cervical multifidi, bilateral splenius captitus and cervicis, bilateral upper traps  Electrical stimulation performed: No Parameters: N/A Treatment response/outcome: skilled palpation used to assess for trigger points and tight bands in the cervical, thoracic and lumbar spine.  Once identified, dry needling techniques used to treat these areas.  Palpable muscle elongation and twitch response noted in the thoracic and cervical multifidi and upper traps.  Patient reported mild soreness post treatment.     DATE: 03/31/22 Nustep x 5 min level 5 (PT present to discuss goals and status) Tband HEP review: (red band) Standing shoulder ext, rows, bilateral shoulder ER and horizontal abd x 20 each Trigger Point Dry-Needling  Treatment instructions: Expect mild to moderate muscle soreness. S/S of pneumothorax if dry needled over a lung field, and to seek immediate medical attention should they occur. Patient verbalized understanding of these instructions and education.  Patient Consent Given: Yes Education handout provided: Yes Muscles treated: bilateral thoracic and cervical multifidi, bilateral splenius captitus and cervicis, bilateral upper traps  Electrical stimulation performed: No Parameters: N/A Treatment response/outcome: skilled palpation used to assess for trigger points and tight bands in the cervical, thoracic and lumbar spine.  Once identified, dry needling techniques used to treat these areas.  Palpable muscle elongation and twitch response noted in the thoracic and cervical multifidi and upper traps.  Patient reported mild soreness post treatment.     DATE:  03/24/22 Initial eval completed and initiated HEP  Dry needling handout provided    PATIENT EDUCATION:  Education details: Initiated HEP and educated on lumbar spine anatomy and natural degenerative changes Person educated: Patient Education method: Programmer, multimedia, Facilities manager, Verbal cues, and Handouts Education comprehension: verbalized understanding, returned demonstration, and verbal cues required  HOME EXERCISE PROGRAM: Access Code: [email or web address] URL: [email or web address] Date: 03/24/2022 Prepared by: Mikey Kirschner  Exercises - Scapular Retraction with Resistance Advanced  - 2 x daily - 7 x weekly - 2 sets - 10 reps - Scapular Retraction with Resistance  - 2 x daily - 7 x weekly - 2 sets - 10 reps - Shoulder External Rotation and Scapular Retraction with Resistance  - 2 x daily - 7 x weekly - 2 sets - 10 reps - Standing Shoulder Horizontal Abduction with Resistance  - 1 x daily - 7 x weekly - 2 sets - 10 reps   ASSESSMENT:  CLINICAL IMPRESSION: Aubreyella is performing postural exercises with ease.  She had some tightness and trigger points in the right thoracic area.  She reported relief of pain post treatment.  She was reminded of soreness window  and advised to use ice if needed.    OBJECTIVE IMPAIRMENTS: decreased strength, increased fascial restrictions, increased muscle spasms, impaired tone, impaired UE functional use, postural dysfunction, and pain.   ACTIVITY LIMITATIONS: carrying, lifting, sleeping, dressing, and caring for others  PARTICIPATION LIMITATIONS: meal prep, cleaning, laundry, driving,  shopping, occupation, and yard work  PERSONAL FACTORS: Fitness, Past/current experiences, Time since onset of injury/illness/exacerbation, and 3+ comorbidities: depression, EDS, POTS  are also affecting patient's functional outcome.   REHAB POTENTIAL: Fair multiple co-morbidities  CLINICAL DECISION MAKING: Evolving/moderate complexity  EVALUATION COMPLEXITY:  Moderate   GOALS: Goals reviewed with patient? Yes  SHORT TERM GOALS: Target date: 04/21/2022    Pain report to be no greater than 4/10  Baseline: Goal status: INITIAL  2.  Patient will be independent with initial HEP  Baseline:  Goal status: INITIAL   LONG TERM GOALS: Target date: 05/19/2022    Patient to report pain no greater than 2/10  Baseline:  Goal status: INITIAL  2.  Patient to be independent with advanced HEP  Baseline:  Goal status: INITIAL  3.  Patient FOTO score to improve to predicted score Baseline:  Goal status: INITIAL  4.  Patient to report 85% improvement in overall symptoms  Baseline:  Goal status: INITIAL  5.  Functional scores to improve by 2-3 seconds each Baseline:  Goal status: INITIAL  6.  Patient to be able to resume some level of consistent exercise. Baseline:  Goal status: INITIAL  PLAN:  PT FREQUENCY: 1-2x/week  PT DURATION: 8 weeks  PLANNED INTERVENTIONS: Therapeutic exercises, Therapeutic activity, Neuromuscular re-education, Balance training, Gait training, Patient/Family education, Self Care, Joint mobilization, Stair training, Aquatic Therapy, Dry Needling, Electrical stimulation, Spinal mobilization, Cryotherapy, Moist heat, scar mobilization, Taping, Traction, Ultrasound, Ionotophoresis 4mg /ml Dexamethasone, Manual therapy, and Re-evaluation.  PLAN FOR NEXT SESSION: NuStep (take precaution for POTS),  progress to core strengthening, possibly start right shoulder stabilization.    Victorino Dike B. Envi Eagleson, PT 04/02/22 12:50 PM  [facility] [address], [address] [place], [place] [address] Phone # [phone] Fax [phone]

## 2022-04-08 NOTE — Therapy (Unsigned)
OUTPATIENT PHYSICAL THERAPY THORACOLUMBAR TREATMENT NOTE   Patient Name: [name] [name] MRN: [id number] DOB:Apr 19, 1990, 32 y.o., female Today's Date: 04/09/2022  END OF SESSION:  PT End of Session - 04/09/22 1015     Visit Number 4    Number of Visits 10    Date for PT Re-Evaluation 05/19/22    Authorization Type [place] [employer]ICA[employer]PA[employer] PLAN    Authorization Time Period [employer] Approved 9 visits, 03/24/2022-05/22/2021-auth#[id number] (10 visits total including eval)    Authorization - Visit Number 4    Authorization - Number of Visits 10    Progress Note Due on Visit 10    PT Start Time 1016    PT Stop Time 1100    PT Time Calculation (min) 44 min    Activity Tolerance Patient tolerated treatment well    Behavior During Therapy WFL for tasks assessed/performed              Past Medical History:  Diagnosis Date   Depression    EDS (Ehlers-Danlos syndrome)    Hyperlipidemia    Migraine    Ovarian cyst    POTS (postural orthostatic tachycardia syndrome)    Transaminitis    Vitamin B12 deficiency    Vitamin D deficiency    Past Surgical History:  Procedure Laterality Date   EYE SURGERY     x2   WISDOM TOOTH EXTRACTION     Patient Active Problem List   Diagnosis Date Noted   Primary hypertension 06/07/2020   NASH (nonalcoholic steatohepatitis) 06/26/2019   Combined hyperlipidemia 06/26/2019    PCP: Mila Palmer, MD   REFERRING PROVIDER: Camie Patience, FNP  REFERRING DIAG:  Diagnosis  M54.9 (ICD-10-CM) - Dorsalgia, unspecified    Rationale for Evaluation and Treatment: Rehabilitation  THERAPY DIAG:  Other low back pain  Muscle weakness (generalized)  Cramp and spasm  Difficulty in walking, not elsewhere classified  ONSET DATE: 02/25/2022  SUBJECTIVE:                                                                                                                                                                                            SUBJECTIVE STATEMENT: 01/04: Pt reports having a migraine this morning. She states that it started last night and has gotten worse. She reports improvements in her scapula pain.     Initial eval: Patient states she has frequent headaches and upper back and right side neck pain.  She recalls that this got worse right after having her daughter.  She had severe pre-eclampsia with her dtrs birth and feels she got really out of shape and has not really recovered  from all of that.  She feels like caring for her dtr and all the lifting contributes to her pain.  She denies any regular exercise.  Had some hip pain previously and had to stop exercising.  She feels that she hasn't returned to exercise regularly since that time. She had a back injury lifting her niece off the carousel and felt a sharp pain.  She took several meds and this finally resolved.  She typically goes to the chiropractor but she is out of visits for the year.  She has underlying POTS (postural orthostatic tachycardia syndrome).  She went through cardiac rehab previously but has not tried to resume exercises since having her dtr due to not wanting to pass out while it is just her and her dtr.  She hopes to learn how to manage her neck pain and be able to resume some level of exercise.    PERTINENT HISTORY:  She has underlying POTS (postural orthostatic tachycardia syndrome)  PAIN:  Are you having pain? Yes: NPRS scale: 3/10 Pain location: Neck right side Pain description: tightness, aching Aggravating factors: lifting her dtr Relieving factors: ice,heat, meds, had her husband press on her back  PRECAUTIONS: None  WEIGHT BEARING RESTRICTIONS: No  FALLS:  Has patient fallen in last 6 months? Yes. Number of falls 1 (fainted last weekend) due to POTS (postural orthostatic tachycardia syndrome)  LIVING ENVIRONMENT: Lives with: lives with their family Lives in: House/apartment  OCCUPATION: stay at home Mom  PLOF: Independent,  Independent with basic ADLs, Independent with household mobility without device, Independent with community mobility without device, Independent with homemaking with ambulation, Independent with gait, and Independent with transfers  PATIENT GOALS: She hopes to learn how to manage her neck pain and be able to resume some level of exercise.    NEXT MD VISIT: prn  OBJECTIVE:   DIAGNOSTIC FINDINGS:  Xrays normal per patient  PATIENT SURVEYS:  03/24/22- NDI: 32/50 (64% disability)  SCREENING FOR RED FLAGS: Bowel or bladder incontinence: No Spinal tumors: No Cauda equina syndrome: No Compression fracture: No Abdominal aneurysm: No  COGNITION: Overall cognitive status: Within functional limits for tasks assessed     SENSATION: WFL   POSTURE: rounded shoulders  PALPATION: Tender to palpation and trigger points along the suboccipital area and upper trap/rhomboids area   CERVICAL ROM:  Hyper mobile c spine MD is checking her for EDS Carylon Perches Danlos Syndrome)   LOWER EXTREMITY ROM:     WFL  UPPER EXTREMITY MMT:    5- to 5/5 throughout  CERVICAL SPECIAL TESTS:  Spurlings negative  FUNCTIONAL TESTS:  5 times sit to stand: 10.0 sec Timed up and go (TUG): 7.99 sec  GAIT: Distance walked: 30 Assistive device utilized: None Level of assistance: Complete Independence Comments: na  TODAY'S TREATMENT:  DATE:  04/09/2022: Nustep x 5 min level 5 (PT present to discuss goals and status) Cervical traction with 20lbs force, static hold, 10 min Trigger Point Dry-Needling  Treatment instructions: Expect mild to moderate muscle soreness. S/S of pneumothorax if dry needled over a lung field, and to seek immediate medical attention should they occur. Patient verbalized understanding of these instructions and education.  Patient Consent Given: Yes Education  handout provided: Yes Muscles treated: bilateral thoracic and cervical multifidi, bilateral splenius captitus and cervicis, bilateral upper traps  Electrical stimulation performed: No Parameters: N/A Treatment response/outcome: skilled palpation used to assess for trigger points and tight bands in the cervical, thoracic and lumbar spine.  Once identified, dry needling techniques used to treat these areas.  Palpable muscle elongation and twitch response noted in the thoracic and cervical multifidi and upper traps.  Patient reported mild soreness post treatment.    04/02/22 Nustep x 5 min level 5 (PT present to discuss goals and status) Tband HEP review: (red band) Standing shoulder ext, rows, bilateral shoulder ER and horizontal abd x 20 each 4 D ball rolls on wall with light blue plyo ball Educated on possible right shoulder hypermobility contributing to her neck and thoracic symptoms. Trigger Point Dry-Needling  Treatment instructions: Expect mild to moderate muscle soreness. S/S of pneumothorax if dry needled over a lung field, and to seek immediate medical attention should they occur. Patient verbalized understanding of these instructions and education.  Patient Consent Given: Yes Education handout provided: Yes Muscles treated: bilateral thoracic and cervical multifidi, bilateral splenius captitus and cervicis, bilateral upper traps  Electrical stimulation performed: No Parameters: N/A Treatment response/outcome: skilled palpation used to assess for trigger points and tight bands in the cervical, thoracic and lumbar spine.  Once identified, dry needling techniques used to treat these areas.  Palpable muscle elongation and twitch response noted in the thoracic and cervical multifidi and upper traps.  Patient reported mild soreness post treatment.     DATE: 03/31/22 Nustep x 5 min level 5 (PT present to discuss goals and status) Tband HEP review: (red band) Standing shoulder ext, rows,  bilateral shoulder ER and horizontal abd x 20 each Trigger Point Dry-Needling  Treatment instructions: Expect mild to moderate muscle soreness. S/S of pneumothorax if dry needled over a lung field, and to seek immediate medical attention should they occur. Patient verbalized understanding of these instructions and education.  Patient Consent Given: Yes Education handout provided: Yes Muscles treated: bilateral thoracic and cervical multifidi, bilateral splenius captitus and cervicis, bilateral upper traps  Electrical stimulation performed: No Parameters: N/A Treatment response/outcome: skilled palpation used to assess for trigger points and tight bands in the cervical, thoracic and lumbar spine.  Once identified, dry needling techniques used to treat these areas.  Palpable muscle elongation and twitch response noted in the thoracic and cervical multifidi and upper traps.  Patient reported mild soreness post treatment.     DATE: 03/24/22 Initial eval completed and initiated HEP  Dry needling handout provided    PATIENT EDUCATION:  Education details: Initiated HEP and educated on lumbar spine anatomy and natural degenerative changes Person educated: Patient Education method: Programmer, multimedia, Facilities manager, Verbal cues, and Handouts Education comprehension: verbalized understanding, returned demonstration, and verbal cues required  HOME EXERCISE PROGRAM: Access Code: [email or web address] URL: [email or web address] Date: 03/24/2022 Prepared by: Mikey Kirschner  Exercises - Scapular Retraction with Resistance Advanced  - 2 x daily - 7 x weekly - 2 sets - 10 reps - Scapular Retraction with Resistance  -  2 x daily - 7 x weekly - 2 sets - 10 reps - Shoulder External Rotation and Scapular Retraction with Resistance  - 2 x daily - 7 x weekly - 2 sets - 10 reps - Standing Shoulder Horizontal Abduction with Resistance  - 1 x daily - 7 x weekly - 2 sets - 10 reps   ASSESSMENT:  CLINICAL  IMPRESSION: [name] [name] reports having an ongoing migraine upon arrival. TPDN performed to cervical musculature with slight improvements noted with her migraine. Cervical traction performed with static hold and 20lb force. Pt reports improvements in migraine. Educated and encouraged pt on chin tucks and cervical stretches at home to help alleviate tension present. Pt will continue to benefit from skilled PT to address continued deficits.    OBJECTIVE IMPAIRMENTS: decreased strength, increased fascial restrictions, increased muscle spasms, impaired tone, impaired UE functional use, postural dysfunction, and pain.   ACTIVITY LIMITATIONS: carrying, lifting, sleeping, dressing, and caring for others  PARTICIPATION LIMITATIONS: meal prep, cleaning, laundry, driving, shopping, occupation, and yard work  PERSONAL FACTORS: Fitness, Past/current experiences, Time since onset of injury/illness/exacerbation, and 3+ comorbidities: depression, EDS, POTS  are also affecting patient's functional outcome.   REHAB POTENTIAL: Fair multiple co-morbidities  CLINICAL DECISION MAKING: Evolving/moderate complexity  EVALUATION COMPLEXITY: Moderate   GOALS: Goals reviewed with patient? Yes  SHORT TERM GOALS: Target date: 04/21/2022    Pain report to be no greater than 4/10  Baseline: Goal status: INITIAL  2.  Patient will be independent with initial HEP  Baseline:  Goal status: INITIAL   LONG TERM GOALS: Target date: 05/19/2022    Patient to report pain no greater than 2/10  Baseline:  Goal status: INITIAL  2.  Patient to be independent with advanced HEP  Baseline:  Goal status: INITIAL  3.  Patient FOTO score to improve to predicted score Baseline:  Goal status: INITIAL  4.  Patient to report 85% improvement in overall symptoms  Baseline:  Goal status: INITIAL  5.  Functional scores to improve by 2-3 seconds each Baseline:  Goal status: INITIAL  6.  Patient to be able to resume some level  of consistent exercise. Baseline:  Goal status: INITIAL  PLAN:  PT FREQUENCY: 1-2x/week  PT DURATION: 8 weeks  PLANNED INTERVENTIONS: Therapeutic exercises, Therapeutic activity, Neuromuscular re-education, Balance training, Gait training, Patient/Family education, Self Care, Joint mobilization, Stair training, Aquatic Therapy, Dry Needling, Electrical stimulation, Spinal mobilization, Cryotherapy, Moist heat, scar mobilization, Taping, Traction, Ultrasound, Ionotophoresis 4mg /ml Dexamethasone, Manual therapy, and Re-evaluation.  PLAN FOR NEXT SESSION: NuStep (take precaution for POTS),  progress to core strengthening, possibly start right shoulder stabilization.    Royal Hawthorn PT, DPT 04/09/22  1:41 PM

## 2022-04-09 ENCOUNTER — Ambulatory Visit: Payer: Medicaid Other | Attending: Family Medicine | Admitting: Physical Therapy

## 2022-04-09 ENCOUNTER — Encounter: Payer: Self-pay | Admitting: Physical Therapy

## 2022-04-09 DIAGNOSIS — R262 Difficulty in walking, not elsewhere classified: Secondary | ICD-10-CM | POA: Diagnosis present

## 2022-04-09 DIAGNOSIS — R252 Cramp and spasm: Secondary | ICD-10-CM | POA: Diagnosis present

## 2022-04-09 DIAGNOSIS — M5459 Other low back pain: Secondary | ICD-10-CM | POA: Diagnosis present

## 2022-04-09 DIAGNOSIS — M6281 Muscle weakness (generalized): Secondary | ICD-10-CM | POA: Insufficient documentation

## 2022-04-09 DIAGNOSIS — R293 Abnormal posture: Secondary | ICD-10-CM | POA: Diagnosis present

## 2022-04-14 ENCOUNTER — Ambulatory Visit: Payer: Medicaid Other

## 2022-04-15 ENCOUNTER — Ambulatory Visit: Payer: Medicaid Other | Admitting: Physical Therapy

## 2022-04-15 DIAGNOSIS — M5459 Other low back pain: Secondary | ICD-10-CM

## 2022-04-15 DIAGNOSIS — M6281 Muscle weakness (generalized): Secondary | ICD-10-CM

## 2022-04-15 DIAGNOSIS — R262 Difficulty in walking, not elsewhere classified: Secondary | ICD-10-CM

## 2022-04-15 DIAGNOSIS — R252 Cramp and spasm: Secondary | ICD-10-CM
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
## 2022-04-17 ENCOUNTER — Ambulatory Visit: Payer: Medicaid Other

## 2022-04-17 DIAGNOSIS — R262 Difficulty in walking, not elsewhere classified: Secondary | ICD-10-CM

## 2022-04-17 DIAGNOSIS — M6281 Muscle weakness (generalized): Secondary | ICD-10-CM

## 2022-04-17 DIAGNOSIS — M5459 Other low back pain: Secondary | ICD-10-CM | POA: Diagnosis not present

## 2022-04-17 DIAGNOSIS — R252 Cramp and spasm: Secondary | ICD-10-CM

## 2022-04-17 NOTE — Therapy (Signed)
OUTPATIENT PHYSICAL THERAPY THORACOLUMBAR TREATMENT NOTE   Patient Name: [name] [name] MRN: [id number] DOB:1990/05/21, 32 y.o., female Today's Date: 04/17/2022  END OF SESSION:  PT End of Session - 04/17/22 0958     Visit Number 6    Number of Visits 10    Date for PT Re-Evaluation 05/19/22    Authorization Type [place] MEDICAID PREPAID HEALTH PLAN    Authorization Time Period [facility] Approved 9 visits, 03/24/2022-05/22/2021-auth#[id number] (10 visits total including eval)    Progress Note Due on Visit 10    PT Start Time 0945    PT Stop Time 1015    PT Time Calculation (min) 30 min    Activity Tolerance Patient tolerated treatment well    Behavior During Therapy WFL for tasks assessed/performed               Past Medical History:  Diagnosis Date   Depression    EDS (Ehlers-Danlos syndrome)    Hyperlipidemia    Migraine    Ovarian cyst    POTS (postural orthostatic tachycardia syndrome)    Transaminitis    Vitamin B12 deficiency    Vitamin D deficiency    Past Surgical History:  Procedure Laterality Date   EYE SURGERY     x2   WISDOM TOOTH EXTRACTION     Patient Active Problem List   Diagnosis Date Noted   Primary hypertension 06/07/2020   NASH (nonalcoholic steatohepatitis) 06/26/2019   Combined hyperlipidemia 06/26/2019    PCP: Mila Palmer, MD   REFERRING PROVIDER: Camie Patience, FNP  REFERRING DIAG:  Diagnosis  M54.9 (ICD-10-CM) - Dorsalgia, unspecified    Rationale for Evaluation and Treatment: Rehabilitation  THERAPY DIAG:  Cramp and spasm  Muscle weakness (generalized)  Difficulty in walking, not elsewhere classified  Other low back pain  ONSET DATE: 02/25/2022  SUBJECTIVE:                                                                                                                                                                                           SUBJECTIVE STATEMENT: 01/04: Pt states she had botox for her migraines  yesterday. (15 min late for appt)  Initial eval: Patient states she has frequent headaches and upper back and right side neck pain.  She recalls that this got worse right after having her daughter.  She had severe pre-eclampsia with her dtrs birth and feels she got really out of shape and has not really recovered from all of that.  She feels like caring for her dtr and all the lifting contributes to her pain.  She denies any regular exercise.  Had some hip  pain previously and had to stop exercising.  She feels that she hasn't returned to exercise regularly since that time. She had a back injury lifting her niece off the carousel and felt a sharp pain.  She took several meds and this finally resolved.  She typically goes to the chiropractor but she is out of visits for the year.  She has underlying POTS (postural orthostatic tachycardia syndrome).  She went through cardiac rehab previously but has not tried to resume exercises since having her dtr due to not wanting to pass out while it is just her and her dtr.  She hopes to learn how to manage her neck pain and be able to resume some level of exercise.    PERTINENT HISTORY:  She has underlying POTS (postural orthostatic tachycardia syndrome)  PAIN:  Are you having pain? Yes: NPRS scale: 3/10 Pain location: Neck right side Pain description: tightness, aching Aggravating factors: lifting her dtr Relieving factors: ice,heat, meds, had her husband press on her back  PRECAUTIONS: None  WEIGHT BEARING RESTRICTIONS: No  FALLS:  Has patient fallen in last 6 months? Yes. Number of falls 1 (fainted last weekend) due to POTS (postural orthostatic tachycardia syndrome)  LIVING ENVIRONMENT: Lives with: lives with their family Lives in: House/apartment  OCCUPATION: stay at home Mom  PLOF: Independent, Independent with basic ADLs, Independent with household mobility without device, Independent with community mobility without device, Independent with  homemaking with ambulation, Independent with gait, and Independent with transfers  PATIENT GOALS: She hopes to learn how to manage her neck pain and be able to resume some level of exercise.    NEXT MD VISIT: prn  OBJECTIVE:   DIAGNOSTIC FINDINGS:  Xrays normal per patient  PATIENT SURVEYS:  03/24/22- NDI: 32/50 (64% disability)  SCREENING FOR RED FLAGS: Bowel or bladder incontinence: No Spinal tumors: No Cauda equina syndrome: No Compression fracture: No Abdominal aneurysm: No  COGNITION: Overall cognitive status: Within functional limits for tasks assessed     SENSATION: WFL   POSTURE: rounded shoulders  PALPATION: Tender to palpation and trigger points along the suboccipital area and upper trap/rhomboids area   CERVICAL ROM:  Hyper mobile c spine MD is checking her for EDS Carylon Perches Danlos Syndrome)   LOWER EXTREMITY ROM:     WFL  UPPER EXTREMITY MMT:    5- to 5/5 throughout  CERVICAL SPECIAL TESTS:  Spurlings negative  FUNCTIONAL TESTS:  5 times sit to stand: 10.0 sec Timed up and go (TUG): 7.99 sec  GAIT: Distance walked: 30 Assistive device utilized: None Level of assistance: Complete Independence Comments: na  TODAY'S TREATMENT:  Date 04/17/2022: Patient 15 min late (had daughter with her) (Patient had Botox injections yesterday)  Moist heat to C spine and thoracic area x 10 min to promote blood flow and soft tissue mobility PROM C spine all planes of motion,  upper trap stretch and suboccipital release x 15 min.    Date 04/15/2022: 04/15/2022: Cervical traction with 25lbs force, static hold, 10 min Trigger Point Dry-Needling  Treatment instructions: Expect mild to moderate muscle soreness. S/S of pneumothorax if dry needled over a lung field, and to seek immediate medical attention should they occur. Patient verbalized understanding of these instructions and education.  Patient Consent Given: Yes Education handout provided: Yes Muscles  treated: bilateral thoracic and cervical multifidi, bilateral splenius captitus and cervicis, bilateral upper traps  Electrical stimulation performed: No Parameters: N/A Treatment response/outcome: skilled palpation used to assess for trigger points and tight  bands in the cervical, thoracic and lumbar spine.  Once identified, dry needling techniques used to treat these areas.  Palpable muscle elongation and twitch response noted in the thoracic and cervical multifidi and upper traps.  Patient reported mild soreness post treatment.    Tband HEP review: (red band) Standing shoulder ext, rows, bilateral shoulder ER and horizontal abd x 20 each        Thoracic ext over ball in chair.                                                                                                                 DATE:  04/09/2022: Nustep x 5 min level 5 (PT present to discuss goals and status) Cervical traction with 20lbs force, static hold, 10 min Trigger Point Dry-Needling  Treatment instructions: Expect mild to moderate muscle soreness. S/S of pneumothorax if dry needled over a lung field, and to seek immediate medical attention should they occur. Patient verbalized understanding of these instructions and education.  Patient Consent Given: Yes Education handout provided: Yes Muscles treated: bilateral thoracic and cervical multifidi, bilateral splenius captitus and cervicis, bilateral upper traps  Electrical stimulation performed: No Parameters: N/A Treatment response/outcome: skilled palpation used to assess for trigger points and tight bands in the cervical, thoracic and lumbar spine.  Once identified, dry needling techniques used to treat these areas.  Palpable muscle elongation and twitch response noted in the thoracic and cervical multifidi and upper traps.  Patient reported mild soreness post treatment.      PATIENT EDUCATION:  Education details: Initiated HEP and educated on lumbar spine anatomy and  natural degenerative changes Person educated: Patient Education method: Programmer, multimedia, Facilities manager, Verbal cues, and Handouts Education comprehension: verbalized understanding, returned demonstration, and verbal cues required  HOME EXERCISE PROGRAM: Access Code: [email or web address] URL: [email or web address] Date: 03/24/2022 Prepared by: Mikey Kirschner  Exercises - Scapular Retraction with Resistance Advanced  - 2 x daily - 7 x weekly - 2 sets - 10 reps - Scapular Retraction with Resistance  - 2 x daily - 7 x weekly - 2 sets - 10 reps - Shoulder External Rotation and Scapular Retraction with Resistance  - 2 x daily - 7 x weekly - 2 sets - 10 reps - Standing Shoulder Horizontal Abduction with Resistance  - 1 x daily - 7 x weekly - 2 sets - 10 reps   ASSESSMENT:  CLINICAL IMPRESSION: [name] [name] was late for appt today.  Due to her having Botox yesterday, we held on traction due to her soreness and opted to do heat and manual techniques.  She did well and had no significant pain with PROM.  Soft tissue with minimal tone and without taut bands and only minimal trigger points today.  She would benefit from continued skilled PT for postural and core strengthening along with LE flexibility and pain control.     OBJECTIVE IMPAIRMENTS: decreased strength, increased fascial restrictions, increased muscle spasms, impaired tone, impaired UE functional use, postural dysfunction, and pain.   ACTIVITY LIMITATIONS: carrying, lifting, sleeping,  dressing, and caring for others  PARTICIPATION LIMITATIONS: meal prep, cleaning, laundry, driving, shopping, occupation, and yard work  PERSONAL FACTORS: Fitness, Past/current experiences, Time since onset of injury/illness/exacerbation, and 3+ comorbidities: depression, EDS, POTS  are also affecting patient's functional outcome.   REHAB POTENTIAL: Fair multiple co-morbidities  CLINICAL DECISION MAKING: Evolving/moderate complexity  EVALUATION COMPLEXITY:  Moderate   GOALS: Goals reviewed with patient? Yes  SHORT TERM GOALS: Target date: 04/21/2022    Pain report to be no greater than 4/10  Baseline: Goal status: INITIAL  2.  Patient will be independent with initial HEP  Baseline:  Goal status: INITIAL   LONG TERM GOALS: Target date: 05/19/2022    Patient to report pain no greater than 2/10  Baseline:  Goal status: INITIAL  2.  Patient to be independent with advanced HEP  Baseline:  Goal status: INITIAL  3.  Patient FOTO score to improve to predicted score Baseline:  Goal status: INITIAL  4.  Patient to report 85% improvement in overall symptoms  Baseline:  Goal status: INITIAL  5.  Functional scores to improve by 2-3 seconds each Baseline:  Goal status: INITIAL  6.  Patient to be able to resume some level of consistent exercise. Baseline:  Goal status: INITIAL  PLAN:  PT FREQUENCY: 1-2x/week  PT DURATION: 8 weeks  PLANNED INTERVENTIONS: Therapeutic exercises, Therapeutic activity, Neuromuscular re-education, Balance training, Gait training, Patient/Family education, Self Care, Joint mobilization, Stair training, Aquatic Therapy, Dry Needling, Electrical stimulation, Spinal mobilization, Cryotherapy, Moist heat, scar mobilization, Taping, Traction, Ultrasound, Ionotophoresis 4mg /ml Dexamethasone, Manual therapy, and Re-evaluation.  PLAN FOR NEXT SESSION: NuStep (take precaution for POTS),  progress to core strengthening, possibly start right shoulder stabilization.    Victorino Dike B. Lalonnie Shaffer, PT 04/17/22 11:13 AM  [facility] [address] [place], [place] [address] Phone # [phone] Fax [phone]

## 2022-04-21 ENCOUNTER — Ambulatory Visit: Payer: Medicaid Other

## 2022-04-21 DIAGNOSIS — R252 Cramp and spasm: Secondary | ICD-10-CM

## 2022-04-21 DIAGNOSIS — M5459 Other low back pain: Secondary | ICD-10-CM

## 2022-04-21 DIAGNOSIS — R262 Difficulty in walking, not elsewhere classified: Secondary | ICD-10-CM

## 2022-04-21 DIAGNOSIS — M6281 Muscle weakness (generalized): Secondary | ICD-10-CM

## 2022-04-21 NOTE — Therapy (Signed)
OUTPATIENT PHYSICAL THERAPY THORACOLUMBAR TREATMENT NOTE   Patient Name: [name] [name] MRN: [id number] DOB:25-Jul-1990, 32 y.o., female Today's Date: 04/21/2022  END OF SESSION:  PT End of Session - 04/21/22 1027     Visit Number 7    Number of Visits 10    Date for PT Re-Evaluation 05/19/22    Authorization Type  MEDICAID PREPAID HEALTH PLAN    Authorization Time Period [facility] Approved 9 visits, 03/24/2022-05/22/2021-auth#[id number] (10 visits total including eval)    Authorization - Visit Number 7    Authorization - Number of Visits 10    Progress Note Due on Visit 10    PT Start Time 1017    PT Stop Time 1100    PT Time Calculation (min) 43 min    Activity Tolerance Patient tolerated treatment well    Behavior During Therapy WFL for tasks assessed/performed               Past Medical History:  Diagnosis Date   Depression    EDS (Ehlers-Danlos syndrome)    Hyperlipidemia    Migraine    Ovarian cyst    POTS (postural orthostatic tachycardia syndrome)    Transaminitis    Vitamin B12 deficiency    Vitamin D deficiency    Past Surgical History:  Procedure Laterality Date   EYE SURGERY     x2   WISDOM TOOTH EXTRACTION     Patient Active Problem List   Diagnosis Date Noted   Primary hypertension 06/07/2020   NASH (nonalcoholic steatohepatitis) 06/26/2019   Combined hyperlipidemia 06/26/2019    PCP: [name] Palmer, MD   REFERRING PROVIDER: Camie Patience, FNP  REFERRING DIAG:  Diagnosis  M54.9 (ICD-10-CM) - Dorsalgia, unspecified    Rationale for Evaluation and Treatment: Rehabilitation  THERAPY DIAG:  Cramp and spasm  Muscle weakness (generalized)  Other low back pain  Difficulty in walking, not elsewhere classified  ONSET DATE: 02/25/2022  SUBJECTIVE:                                                                                                                                                                                            SUBJECTIVE STATEMENT: 01/04: Pt states she is having more pain in the upper back and neck today.    Initial eval: Patient states she has frequent headaches and upper back and right side neck pain.  She recalls that this got worse right after having her daughter.  She had severe pre-eclampsia with her dtrs birth and feels she got really out of shape and has not really recovered from all of that.  She feels like caring for her  dtr and all the lifting contributes to her pain.  She denies any regular exercise.  Had some hip pain previously and had to stop exercising.  She feels that she hasn't returned to exercise regularly since that time. She had a back injury lifting her niece off the carousel and felt a sharp pain.  She took several meds and this finally resolved.  She typically goes to the chiropractor but she is out of visits for the year.  She has underlying POTS (postural orthostatic tachycardia syndrome).  She went through cardiac rehab previously but has not tried to resume exercises since having her dtr due to not wanting to pass out while it is just her and her dtr.  She hopes to learn how to manage her neck pain and be able to resume some level of exercise.    PERTINENT HISTORY:  She has underlying POTS (postural orthostatic tachycardia syndrome)  PAIN:  Are you having pain? Yes: NPRS scale: 3/10 Pain location: Neck right side Pain description: tightness, aching Aggravating factors: lifting her dtr Relieving factors: ice,heat, meds, had her husband press on her back  PRECAUTIONS: None  WEIGHT BEARING RESTRICTIONS: No  FALLS:  Has patient fallen in last 6 months? Yes. Number of falls 1 (fainted last weekend) due to POTS (postural orthostatic tachycardia syndrome)  LIVING ENVIRONMENT: Lives with: lives with their family Lives in: House/apartment  OCCUPATION: stay at home Mom  PLOF: Independent, Independent with basic ADLs, Independent with household mobility without device,  Independent with community mobility without device, Independent with homemaking with ambulation, Independent with gait, and Independent with transfers  PATIENT GOALS: She hopes to learn how to manage her neck pain and be able to resume some level of exercise.    NEXT MD VISIT: prn  OBJECTIVE:   DIAGNOSTIC FINDINGS:  Xrays normal per patient  PATIENT SURVEYS:  03/24/22- NDI: 32/50 (64% disability)  SCREENING FOR RED FLAGS: Bowel or bladder incontinence: No Spinal tumors: No Cauda equina syndrome: No Compression fracture: No Abdominal aneurysm: No  COGNITION: Overall cognitive status: Within functional limits for tasks assessed     SENSATION: WFL   POSTURE: rounded shoulders  PALPATION: Tender to palpation and trigger points along the suboccipital area and upper trap/rhomboids area   CERVICAL ROM:  Hyper mobile c spine MD is checking her for EDS [name] [name] Danlos Syndrome)   LOWER EXTREMITY ROM:     WFL  UPPER EXTREMITY MMT:    5- to 5/5 throughout  CERVICAL SPECIAL TESTS:  Spurlings negative  FUNCTIONAL TESTS:  5 times sit to stand: 10.0 sec Timed up and go (TUG): 7.99 sec  GAIT: Distance walked: 30 Assistive device utilized: None Level of assistance: Complete Independence Comments: na  TODAY'S TREATMENT:  Date 04/21/2022: Prone shoulder ext, row and horizontal abduction with 2 lb bilaterally 2 x 10 Side lying ER with 2 lb 2 x 10 both Supine serratus punch x 20 with 2 lb both Trigger Point Dry-Needling  Treatment instructions: Expect mild to moderate muscle soreness. S/S of pneumothorax if dry needled over a lung field, and to seek immediate medical attention should they occur. Patient verbalized understanding of these instructions and education.  Patient Consent Given: Yes Education handout provided: Yes Muscles treated: bilateral cervical multifidi, left splenius captitus and cervicis, left upper trap Electrical stimulation performed:  No Parameters: N/A Treatment response/outcome: skilled palpation used to assess for trigger points and tight bands in the cervical, thoracic and lumbar spine.  Once identified, dry needling techniques used to  treat these areas.  Palpable muscle elongation and twitch response noted in the thoracic and cervical multifidi and upper traps.  Patient reported mild soreness post treatment.    Tband HEP review: (red band) Standing shoulder ext, rows, bilateral shoulder ER and horizontal abd x 20 each        Thoracic ext over ball in chair.    Date 04/17/2022: Patient 15 min late (had daughter with her) (Patient had Botox injections yesterday)  Moist heat to C spine and thoracic area x 10 min to promote blood flow and soft tissue mobility PROM C spine all planes of motion,  upper trap stretch and suboccipital release x 15 min.    Date 04/15/2022: 04/15/2022: Cervical traction with 25lbs force, static hold, 10 min Trigger Point Dry-Needling  Treatment instructions: Expect mild to moderate muscle soreness. S/S of pneumothorax if dry needled over a lung field, and to seek immediate medical attention should they occur. Patient verbalized understanding of these instructions and education.  Patient Consent Given: Yes Education handout provided: Yes Muscles treated: bilateral thoracic and cervical multifidi, bilateral splenius captitus and cervicis, bilateral upper traps  Electrical stimulation performed: No Parameters: N/A Treatment response/outcome: skilled palpation used to assess for trigger points and tight bands in the cervical, thoracic and lumbar spine.  Once identified, dry needling techniques used to treat these areas.  Palpable muscle elongation and twitch response noted in the thoracic and cervical multifidi and upper traps.  Patient reported mild soreness post treatment.    Tband HEP review: (red band) Standing shoulder ext, rows, bilateral shoulder ER and horizontal abd x 20 each         Thoracic ext over ball in chair.                                                                                                                 DATE:  04/09/2022: Nustep x 5 min level 5 (PT present to discuss goals and status) Cervical traction with 20lbs force, static hold, 10 min Trigger Point Dry-Needling  Treatment instructions: Expect mild to moderate muscle soreness. S/S of pneumothorax if dry needled over a lung field, and to seek immediate medical attention should they occur. Patient verbalized understanding of these instructions and education.  Patient Consent Given: Yes Education handout provided: Yes Muscles treated: bilateral cervical multifidi, left splenius captitus and cervicis, left upper traps  Electrical stimulation performed: No Parameters: N/A Treatment response/outcome: skilled palpation used to assess for trigger points and tight bands in the cervical, thoracic and lumbar spine.  Once identified, dry needling techniques used to treat these areas.  Palpable muscle elongation and twitch response noted in the thoracic and cervical multifidi and upper traps.  Patient reported mild soreness post treatment.      PATIENT EDUCATION:  Education details: Initiated HEP and educated on lumbar spine anatomy and natural degenerative changes Person educated: Patient Education method: Programmer, multimedia, Facilities manager, Verbal cues, and Handouts Education comprehension: verbalized understanding, returned demonstration, and verbal cues required  HOME EXERCISE PROGRAM: Access Code:  [email or web address] URL: [email or web address] Date: 03/24/2022 Prepared by: Mikey Kirschner  Exercises - Scapular Retraction with Resistance Advanced  - 2 x daily - 7 x weekly - 2 sets - 10 reps - Scapular Retraction with Resistance  - 2 x daily - 7 x weekly - 2 sets - 10 reps - Shoulder External Rotation and Scapular Retraction with Resistance  - 2 x daily - 7 x weekly - 2 sets - 10 reps - Standing  Shoulder Horizontal Abduction with Resistance  - 1 x daily - 7 x weekly - 2 sets - 10 reps   ASSESSMENT:  CLINICAL IMPRESSION: Zamiah had some significant trigger points in the left upper trap and cervical paraspinals.  Multiple twitch responses noted during treatment and decreased pain reported following treatment.  She was able to complete scapular stabilization set with 2 lbs with mod fatigue but no pain.  She would benefit from continued skilled PT for postural and core strengthening along with LE flexibility and pain control.     OBJECTIVE IMPAIRMENTS: decreased strength, increased fascial restrictions, increased muscle spasms, impaired tone, impaired UE functional use, postural dysfunction, and pain.   ACTIVITY LIMITATIONS: carrying, lifting, sleeping, dressing, and caring for others  PARTICIPATION LIMITATIONS: meal prep, cleaning, laundry, driving, shopping, occupation, and yard work  PERSONAL FACTORS: Fitness, Past/current experiences, Time since onset of injury/illness/exacerbation, and 3+ comorbidities: depression, EDS, POTS  are also affecting patient's functional outcome.   REHAB POTENTIAL: Fair multiple co-morbidities  CLINICAL DECISION MAKING: Evolving/moderate complexity  EVALUATION COMPLEXITY: Moderate   GOALS: Goals reviewed with patient? Yes  SHORT TERM GOALS: Target date: 04/21/2022    Pain report to be no greater than 4/10  Baseline: Goal status: INITIAL  2.  Patient will be independent with initial HEP  Baseline:  Goal status: INITIAL   LONG TERM GOALS: Target date: 05/19/2022    Patient to report pain no greater than 2/10  Baseline:  Goal status: INITIAL  2.  Patient to be independent with advanced HEP  Baseline:  Goal status: INITIAL  3.  Patient FOTO score to improve to predicted score Baseline:  Goal status: INITIAL  4.  Patient to report 85% improvement in overall symptoms  Baseline:  Goal status: INITIAL  5.  Functional scores to  improve by 2-3 seconds each Baseline:  Goal status: INITIAL  6.  Patient to be able to resume some level of consistent exercise. Baseline:  Goal status: INITIAL  PLAN:  PT FREQUENCY: 1-2x/week  PT DURATION: 8 weeks  PLANNED INTERVENTIONS: Therapeutic exercises, Therapeutic activity, Neuromuscular re-education, Balance training, Gait training, Patient/Family education, Self Care, Joint mobilization, Stair training, Aquatic Therapy, Dry Needling, Electrical stimulation, Spinal mobilization, Cryotherapy, Moist heat, scar mobilization, Taping, Traction, Ultrasound, Ionotophoresis 4mg /ml Dexamethasone, Manual therapy, and Re-evaluation.  PLAN FOR NEXT SESSION: NuStep (take precaution for POTS),  progress to core strengthening, progress shoulder stabilization.    Victorino Dike B. Haileigh Pitz, PT 04/21/22 11:27 AM  [facility] [address] [place], [place] [address] Phone # [phone] Fax [phone]

## 2022-04-23 ENCOUNTER — Ambulatory Visit: Payer: Medicaid Other

## 2022-04-23 DIAGNOSIS — R262 Difficulty in walking, not elsewhere classified: Secondary | ICD-10-CM

## 2022-04-23 DIAGNOSIS — M5459 Other low back pain: Secondary | ICD-10-CM

## 2022-04-23 DIAGNOSIS — M6281 Muscle weakness (generalized): Secondary | ICD-10-CM

## 2022-04-23 DIAGNOSIS — R252 Cramp and spasm: Secondary | ICD-10-CM

## 2022-04-23 NOTE — Therapy (Signed)
OUTPATIENT PHYSICAL THERAPY THORACOLUMBAR TREATMENT NOTE   Patient Name: [name] [name] MRN: [id number] DOB:[birth year], 32 y.o., female Today's Date: 04/23/2022  END OF SESSION:  PT End of Session - 04/23/22 1003     Visit Number 8    Number of Visits 10    Date for PT Re-Evaluation 05/19/22    Authorization Type [place] MEDICAID PREPAID HEALTH PLAN    Authorization Time Period [facility] Approved 9 visits, 03/24/2022-05/22/2021-auth#[id number] (10 visits total including eval)    Progress Note Due on Visit 10    PT Start Time 0935    PT Stop Time 1019    PT Time Calculation (min) 44 min    Activity Tolerance Patient tolerated treatment well    Behavior During Therapy WFL for tasks assessed/performed               Past Medical History:  Diagnosis Date   Depression    EDS (Ehlers-Danlos syndrome)    Hyperlipidemia    Migraine    Ovarian cyst    POTS (postural orthostatic tachycardia syndrome)    Transaminitis    Vitamin B12 deficiency    Vitamin D deficiency    Past Surgical History:  Procedure Laterality Date   EYE SURGERY     x2   WISDOM TOOTH EXTRACTION     Patient Active Problem List   Diagnosis Date Noted   Primary hypertension 06/07/2020   NASH (nonalcoholic steatohepatitis) 06/26/2019   Combined hyperlipidemia 06/26/2019    PCP: Mila Palmer, MD   REFERRING PROVIDER: Camie Patience, FNP  REFERRING DIAG:  Diagnosis  M54.9 (ICD-10-CM) - Dorsalgia, unspecified    Rationale for Evaluation and Treatment: Rehabilitation  THERAPY DIAG:  Cramp and spasm  Muscle weakness (generalized)  Other low back pain  Difficulty in walking, not elsewhere classified  ONSET DATE: 02/25/2022  SUBJECTIVE:                                                                                                                                                                                           SUBJECTIVE STATEMENT: 01/04: Pt states she is having more pain in the upper  back and neck today.    Initial eval: Patient states she has frequent headaches and upper back and right side neck pain.  She recalls that this got worse right after having her daughter.  She had severe pre-eclampsia with her dtrs birth and feels she got really out of shape and has not really recovered from all of that.  She feels like caring for her dtr and all the lifting contributes to her pain.  She denies any regular exercise.  Had  some hip pain previously and had to stop exercising.  She feels that she hasn't returned to exercise regularly since that time. She had a back injury lifting her niece off the carousel and felt a sharp pain.  She took several meds and this finally resolved.  She typically goes to the chiropractor but she is out of visits for the year.  She has underlying POTS (postural orthostatic tachycardia syndrome).  She went through cardiac rehab previously but has not tried to resume exercises since having her dtr due to not wanting to pass out while it is just her and her dtr.  She hopes to learn how to manage her neck pain and be able to resume some level of exercise.    PERTINENT HISTORY:  She has underlying POTS (postural orthostatic tachycardia syndrome)  PAIN:  Are you having pain? Yes: NPRS scale: 3/10 Pain location: Neck right side Pain description: tightness, aching Aggravating factors: lifting her dtr Relieving factors: ice,heat, meds, had her husband press on her back  PRECAUTIONS: None  WEIGHT BEARING RESTRICTIONS: No  FALLS:  Has patient fallen in last 6 months? Yes. Number of falls 1 (fainted last weekend) due to POTS (postural orthostatic tachycardia syndrome)  LIVING ENVIRONMENT: Lives with: lives with their family Lives in: House/apartment  OCCUPATION: stay at home Mom  PLOF: Independent, Independent with basic ADLs, Independent with household mobility without device, Independent with community mobility without device, Independent with homemaking  with ambulation, Independent with gait, and Independent with transfers  PATIENT GOALS: She hopes to learn how to manage her neck pain and be able to resume some level of exercise.    NEXT MD VISIT: prn  OBJECTIVE:   DIAGNOSTIC FINDINGS:  Xrays normal per patient  PATIENT SURVEYS:  03/24/22- NDI: 32/50 (64% disability)  SCREENING FOR RED FLAGS: Bowel or bladder incontinence: No Spinal tumors: No Cauda equina syndrome: No Compression fracture: No Abdominal aneurysm: No  COGNITION: Overall cognitive status: Within functional limits for tasks assessed     SENSATION: WFL   POSTURE: rounded shoulders  PALPATION: Tender to palpation and trigger points along the suboccipital area and upper trap/rhomboids area   CERVICAL ROM:  Hyper mobile c spine MD is checking her for EDS Carylon Perches Danlos Syndrome)   LOWER EXTREMITY ROM:     WFL  UPPER EXTREMITY MMT:    5- to 5/5 throughout  CERVICAL SPECIAL TESTS:  Spurlings negative  FUNCTIONAL TESTS:  5 times sit to stand: 10.0 sec Timed up and go (TUG): 7.99 sec  GAIT: Distance walked: 30 Assistive device utilized: None Level of assistance: Complete Independence Comments: na  TODAY'S TREATMENT:  Date 04/21/2022: Nustep x 5 min level 5  Standing shoulder extension, rows, bilateral shoulder ER, standing horizontal abduction x 20 each Mechanical traction Max 20 lbs, min 10 lbs, time 15 min.    Date 04/21/2022: Prone shoulder ext, row and horizontal abduction with 2 lb bilaterally 2 x 10 Side lying ER with 2 lb 2 x 10 both Supine serratus punch x 20 with 2 lb both Trigger Point Dry-Needling  Treatment instructions: Expect mild to moderate muscle soreness. S/S of pneumothorax if dry needled over a lung field, and to seek immediate medical attention should they occur. Patient verbalized understanding of these instructions and education.  Patient Consent Given: Yes Education handout provided: Yes Muscles treated:  bilateral cervical multifidi, left splenius captitus and cervicis, left upper trap Electrical stimulation performed: No Parameters: N/A Treatment response/outcome: skilled palpation used to assess for trigger  points and tight bands in the cervical, thoracic and lumbar spine.  Once identified, dry needling techniques used to treat these areas.  Palpable muscle elongation and twitch response noted in the thoracic and cervical multifidi and upper traps.  Patient reported mild soreness post treatment.    Tband HEP review: (red band) Standing shoulder ext, rows, bilateral shoulder ER and horizontal abd x 20 each        Thoracic ext over ball in chair.    Date 04/17/2022: Patient 15 min late (had daughter with her) (Patient had Botox injections yesterday)  Moist heat to C spine and thoracic area x 10 min to promote blood flow and soft tissue mobility PROM C spine all planes of motion,  upper trap stretch and suboccipital release x 15 min.    Date 04/15/2022: 04/15/2022: Cervical traction with 25lbs force, static hold, 10 min Trigger Point Dry-Needling  Treatment instructions: Expect mild to moderate muscle soreness. S/S of pneumothorax if dry needled over a lung field, and to seek immediate medical attention should they occur. Patient verbalized understanding of these instructions and education.  Patient Consent Given: Yes Education handout provided: Yes Muscles treated: bilateral thoracic and cervical multifidi, bilateral splenius captitus and cervicis, bilateral upper traps  Electrical stimulation performed: No Parameters: N/A Treatment response/outcome: skilled palpation used to assess for trigger points and tight bands in the cervical, thoracic and lumbar spine.  Once identified, dry needling techniques used to treat these areas.  Palpable muscle elongation and twitch response noted in the thoracic and cervical multifidi and upper traps.  Patient reported mild soreness post treatment.     Tband HEP review: (red band) Standing shoulder ext, rows, bilateral shoulder ER and horizontal abd x 20 each        Thoracic ext over ball in chair.                                                                                                                  PATIENT EDUCATION:  Education details: Initiated HEP and educated on lumbar spine anatomy and natural degenerative changes Person educated: Patient Education method: Programmer, multimedia, Facilities manager, Verbal cues, and Handouts Education comprehension: verbalized understanding, returned demonstration, and verbal cues required  HOME EXERCISE PROGRAM: Access Code: [email or web address] URL: [email or web address] Date: 03/24/2022 Prepared by: Mikey Kirschner  Exercises - Scapular Retraction with Resistance Advanced  - 2 x daily - 7 x weekly - 2 sets - 10 reps - Scapular Retraction with Resistance  - 2 x daily - 7 x weekly - 2 sets - 10 reps - Shoulder External Rotation and Scapular Retraction with Resistance  - 2 x daily - 7 x weekly - 2 sets - 10 reps - Standing Shoulder Horizontal Abduction with Resistance  - 1 x daily - 7 x weekly - 2 sets - 10 reps   ASSESSMENT:  CLINICAL IMPRESSION: [name] [name] is progressing appropriately.  She appears to be compliant with her HEP.  She continues to experience migraines intermittently.  She would benefit from continued skilled PT for  postural and core strengthening along with LE flexibility and pain control.     OBJECTIVE IMPAIRMENTS: decreased strength, increased fascial restrictions, increased muscle spasms, impaired tone, impaired UE functional use, postural dysfunction, and pain.   ACTIVITY LIMITATIONS: carrying, lifting, sleeping, dressing, and caring for others  PARTICIPATION LIMITATIONS: meal prep, cleaning, laundry, driving, shopping, occupation, and yard work  PERSONAL FACTORS: Fitness, Past/current experiences, Time since onset of injury/illness/exacerbation, and 3+ comorbidities:  depression, EDS, POTS  are also affecting patient's functional outcome.   REHAB POTENTIAL: Fair multiple co-morbidities  CLINICAL DECISION MAKING: Evolving/moderate complexity  EVALUATION COMPLEXITY: Moderate   GOALS: Goals reviewed with patient? Yes  SHORT TERM GOALS: Target date: 04/21/2022    Pain report to be no greater than 4/10  Baseline: Goal status: MET  2.  Patient will be independent with initial HEP  Baseline:  Goal status: MET   LONG TERM GOALS: Target date: 05/19/2022    Patient to report pain no greater than 2/10  Baseline:  Goal status: INITIAL  2.  Patient to be independent with advanced HEP  Baseline:  Goal status: INITIAL  3.  Patient FOTO score to improve to predicted score Baseline:  Goal status: INITIAL  4.  Patient to report 85% improvement in overall symptoms  Baseline:  Goal status: INITIAL  5.  Functional scores to improve by 2-3 seconds each Baseline:  Goal status: INITIAL  6.  Patient to be able to resume some level of consistent exercise. Baseline:  Goal status: INITIAL  PLAN:  PT FREQUENCY: 1-2x/week  PT DURATION: 8 weeks  PLANNED INTERVENTIONS: Therapeutic exercises, Therapeutic activity, Neuromuscular re-education, Balance training, Gait training, Patient/Family education, Self Care, Joint mobilization, Stair training, Aquatic Therapy, Dry Needling, Electrical stimulation, Spinal mobilization, Cryotherapy, Moist heat, scar mobilization, Taping, Traction, Ultrasound, Ionotophoresis 4mg /ml Dexamethasone, Manual therapy, and Re-evaluation.  PLAN FOR NEXT SESSION: NuStep or UBE (take precaution for POTS),  progress postural strengthening, progress shoulder stabilization, traction, dry needling and manual techniques to control symptoms.    Victorino Dike B. Barbera Perritt, PT 04/23/22 11:02 AM  [facility] [address], [address] [place], [place] [address] Phone # [phone] Fax [phone]

## 2022-04-28 ENCOUNTER — Ambulatory Visit: Payer: Medicaid Other

## 2022-04-28 DIAGNOSIS — R293 Abnormal posture: Secondary | ICD-10-CM

## 2022-04-28 DIAGNOSIS — M6281 Muscle weakness (generalized): Secondary | ICD-10-CM

## 2022-04-28 DIAGNOSIS — M5459 Other low back pain: Secondary | ICD-10-CM

## 2022-04-28 DIAGNOSIS — R252 Cramp and spasm: Secondary | ICD-10-CM

## 2022-04-28 DIAGNOSIS — R262 Difficulty in walking, not elsewhere classified: Secondary | ICD-10-CM

## 2022-04-28 NOTE — Therapy (Signed)
OUTPATIENT PHYSICAL THERAPY THORACOLUMBAR TREATMENT NOTE   Patient Name: [name] [name] MRN: [id number] DOB:12-03-1990, 32 y.o., female Today's Date: 04/28/2022  END OF SESSION:  PT End of Session - 04/28/22 1034     Visit Number 9    Number of Visits 10    Date for PT Re-Evaluation 05/19/22    Authorization Type [place] MEDICAID PREPAID HEALTH PLAN    Authorization Time Period [facility] Approved 9 visits, 03/24/2022-05/22/2021-auth#[id number] (10 visits total including eval)    Authorization - Number of Visits 10    Progress Note Due on Visit 10    PT Start Time 1015    PT Stop Time 1100    PT Time Calculation (min) 45 min    Activity Tolerance Patient tolerated treatment well    Behavior During Therapy WFL for tasks assessed/performed               Past Medical History:  Diagnosis Date   Depression    EDS (Ehlers-Danlos syndrome)    Hyperlipidemia    Migraine    Ovarian cyst    POTS (postural orthostatic tachycardia syndrome)    Transaminitis    Vitamin B12 deficiency    Vitamin D deficiency    Past Surgical History:  Procedure Laterality Date   EYE SURGERY     x2   WISDOM TOOTH EXTRACTION     Patient Active Problem List   Diagnosis Date Noted   Primary hypertension 06/07/2020   NASH (nonalcoholic steatohepatitis) 06/26/2019   Combined hyperlipidemia 06/26/2019    PCP: Mila Palmer, MD   REFERRING PROVIDER: Camie Patience, FNP  REFERRING DIAG:  Diagnosis  M54.9 (ICD-10-CM) - Dorsalgia, unspecified    Rationale for Evaluation and Treatment: Rehabilitation  THERAPY DIAG:  Cramp and spasm  Muscle weakness (generalized)  Other low back pain  Difficulty in walking, not elsewhere classified  Abnormal posture  ONSET DATE: 02/25/2022  SUBJECTIVE:                                                                                                                                                                                           SUBJECTIVE  STATEMENT: 01/23: Pt states she is doing pretty good today.  A little pain in the right thoracic/parascapular area.    Initial eval: Patient states she has frequent headaches and upper back and right side neck pain.  She recalls that this got worse right after having her daughter.  She had severe pre-eclampsia with her dtrs birth and feels she got really out of shape and has not really recovered from all of that.  She feels like caring for her dtr and  all the lifting contributes to her pain.  She denies any regular exercise.  Had some hip pain previously and had to stop exercising.  She feels that she hasn't returned to exercise regularly since that time. She had a back injury lifting her niece off the carousel and felt a sharp pain.  She took several meds and this finally resolved.  She typically goes to the chiropractor but she is out of visits for the year.  She has underlying POTS (postural orthostatic tachycardia syndrome).  She went through cardiac rehab previously but has not tried to resume exercises since having her dtr due to not wanting to pass out while it is just her and her dtr.  She hopes to learn how to manage her neck pain and be able to resume some level of exercise.    PERTINENT HISTORY:  She has underlying POTS (postural orthostatic tachycardia syndrome)  PAIN:  Are you having pain? Yes: NPRS scale: 3/10 Pain location: Neck right side Pain description: tightness, aching Aggravating factors: lifting her dtr Relieving factors: ice,heat, meds, had her husband press on her back  PRECAUTIONS: None  WEIGHT BEARING RESTRICTIONS: No  FALLS:  Has patient fallen in last 6 months? Yes. Number of falls 1 (fainted last weekend) due to POTS (postural orthostatic tachycardia syndrome)  LIVING ENVIRONMENT: Lives with: lives with their family Lives in: House/apartment  OCCUPATION: stay at home Mom  PLOF: Independent, Independent with basic ADLs, Independent with household mobility  without device, Independent with community mobility without device, Independent with homemaking with ambulation, Independent with gait, and Independent with transfers  PATIENT GOALS: She hopes to learn how to manage her neck pain and be able to resume some level of exercise.    NEXT MD VISIT: prn  OBJECTIVE:   DIAGNOSTIC FINDINGS:  Xrays normal per patient  PATIENT SURVEYS:  03/24/22- NDI: 32/50 (64% disability)  SCREENING FOR RED FLAGS: Bowel or bladder incontinence: No Spinal tumors: No Cauda equina syndrome: No Compression fracture: No Abdominal aneurysm: No  COGNITION: Overall cognitive status: Within functional limits for tasks assessed     SENSATION: WFL   POSTURE: rounded shoulders  PALPATION: Tender to palpation and trigger points along the suboccipital area and upper trap/rhomboids area   CERVICAL ROM:  Hyper mobile c spine MD is checking her for EDS Carylon Perches Danlos Syndrome)   LOWER EXTREMITY ROM:     WFL  UPPER EXTREMITY MMT:    5- to 5/5 throughout  CERVICAL SPECIAL TESTS:  Spurlings negative  FUNCTIONAL TESTS:  5 times sit to stand: 10.0 sec Timed up and go (TUG): 7.99 sec  GAIT: Distance walked: 30 Assistive device utilized: None Level of assistance: Complete Independence Comments: na  TODAY'S TREATMENT:  Date 04/28/2022: UBE x 5 min (2.5 min fwd, 2.5 min reverse) TYI's over blue physio ball with 0# 2 x 10 Wall push ups with plus x 10 3 way scapular stabilization with blue loop x 5 each both 4 D ball rolls x 20 each with red plyo ball both Standing shoulder flexion and scaption with 1 lb 2 x 10 each both Ue's Prone position addaday to bilateral thoracic paraspinals and upper traps  x 8 min Added open book bilateral and thoracic extension with foam roller to HEP  Date 04/23/2022: Nustep x 5 min level 5  Standing shoulder extension, rows, bilateral shoulder ER, standing horizontal abduction x 20 each Mechanical traction Max 20 lbs,  min 10 lbs, time 15 min.    Date 04/21/2022: Prone  shoulder ext, row and horizontal abduction with 2 lb bilaterally 2 x 10 Side lying ER with 2 lb 2 x 10 both Supine serratus punch x 20 with 2 lb both Trigger Point Dry-Needling  Treatment instructions: Expect mild to moderate muscle soreness. S/S of pneumothorax if dry needled over a lung field, and to seek immediate medical attention should they occur. Patient verbalized understanding of these instructions and education.  Patient Consent Given: Yes Education handout provided: Yes Muscles treated: bilateral cervical multifidi, left splenius captitus and cervicis, left upper trap Electrical stimulation performed: No Parameters: N/A Treatment response/outcome: skilled palpation used to assess for trigger points and tight bands in the cervical, thoracic and lumbar spine.  Once identified, dry needling techniques used to treat these areas.  Palpable muscle elongation and twitch response noted in the thoracic and cervical multifidi and upper traps.  Patient reported mild soreness post treatment.    Tband HEP review: (red band) Standing shoulder ext, rows, bilateral shoulder ER and horizontal abd x 20 each        Thoracic ext over ball in chair.     PATIENT EDUCATION:  Education details: Initiated HEP and educated on lumbar spine anatomy and natural degenerative changes Person educated: Patient Education method: Programmer, multimedia, Facilities manager, Verbal cues, and Handouts Education comprehension: verbalized understanding, returned demonstration, and verbal cues required  HOME EXERCISE PROGRAM: Access Code: [email or web address] URL: [email or web address] Date: 03/24/2022 Prepared by: Mikey Kirschner  Exercises - Scapular Retraction with Resistance Advanced  - 2 x daily - 7 x weekly - 2 sets - 10 reps - Scapular Retraction with Resistance  - 2 x daily - 7 x weekly - 2 sets - 10 reps - Shoulder External Rotation and Scapular Retraction with  Resistance  - 2 x daily - 7 x weekly - 2 sets - 10 reps - Standing Shoulder Horizontal Abduction with Resistance  - 1 x daily - 7 x weekly - 2 sets - 10 reps   ASSESSMENT:  CLINICAL IMPRESSION: Jeanean was able to tolerate addition of TYI's and 3 way scapular stabilization along with standing shoulder flexion and scaption with no increase in pain.  She is compliant and well motivated.  She should continue to do well.   She would benefit from continued skilled PT for postural and core strengthening along with LE flexibility and pain control.     OBJECTIVE IMPAIRMENTS: decreased strength, increased fascial restrictions, increased muscle spasms, impaired tone, impaired UE functional use, postural dysfunction, and pain.   ACTIVITY LIMITATIONS: carrying, lifting, sleeping, dressing, and caring for others  PARTICIPATION LIMITATIONS: meal prep, cleaning, laundry, driving, shopping, occupation, and yard work  PERSONAL FACTORS: Fitness, Past/current experiences, Time since onset of injury/illness/exacerbation, and 3+ comorbidities: depression, EDS, POTS  are also affecting patient's functional outcome.   REHAB POTENTIAL: Fair multiple co-morbidities  CLINICAL DECISION MAKING: Evolving/moderate complexity  EVALUATION COMPLEXITY: Moderate   GOALS: Goals reviewed with patient? Yes  SHORT TERM GOALS: Target date: 04/21/2022    Pain report to be no greater than 4/10  Baseline: Goal status: MET  2.  Patient will be independent with initial HEP  Baseline:  Goal status: MET   LONG TERM GOALS: Target date: 05/19/2022    Patient to report pain no greater than 2/10  Baseline:  Goal status: INITIAL  2.  Patient to be independent with advanced HEP  Baseline:  Goal status: INITIAL  3.  Patient FOTO score to improve to predicted score Baseline:  Goal status: INITIAL  4.  Patient to  report 85% improvement in overall symptoms  Baseline:  Goal status: INITIAL  5.  Functional scores to  improve by 2-3 seconds each Baseline:  Goal status: INITIAL  6.  Patient to be able to resume some level of consistent exercise. Baseline:  Goal status: INITIAL  PLAN:  PT FREQUENCY: 1-2x/week  PT DURATION: 8 weeks  PLANNED INTERVENTIONS: Therapeutic exercises, Therapeutic activity, Neuromuscular re-education, Balance training, Gait training, Patient/Family education, Self Care, Joint mobilization, Stair training, Aquatic Therapy, Dry Needling, Electrical stimulation, Spinal mobilization, Cryotherapy, Moist heat, scar mobilization, Taping, Traction, Ultrasound, Ionotophoresis 4mg /ml Dexamethasone, Manual therapy, and Re-evaluation.  PLAN FOR NEXT SESSION: NuStep or UBE (take precaution for POTS),  progress postural strengthening, progress shoulder stabilization, traction, dry needling and manual techniques to control symptoms.    Victorino Dike B. Kripa Foskey, PT 04/28/22 11:08 AM  [facility] [address] [place], [place] [address] Phone # [phone] Fax [phone]

## 2022-04-30 ENCOUNTER — Encounter: Payer: Self-pay | Admitting: Rehabilitative and Restorative Service Providers"

## 2022-04-30 ENCOUNTER — Ambulatory Visit: Payer: Medicaid Other | Admitting: Rehabilitative and Restorative Service Providers"

## 2022-04-30 DIAGNOSIS — M5459 Other low back pain: Secondary | ICD-10-CM

## 2022-04-30 DIAGNOSIS — R252 Cramp and spasm: Secondary | ICD-10-CM

## 2022-04-30 DIAGNOSIS — R262 Difficulty in walking, not elsewhere classified: Secondary | ICD-10-CM

## 2022-04-30 DIAGNOSIS — M6281 Muscle weakness (generalized): Secondary | ICD-10-CM

## 2022-04-30 NOTE — Therapy (Signed)
OUTPATIENT PHYSICAL THERAPY TREATMENT NOTE   Patient Name: [name] [name] MRN: [id number] DOB:Jan 24, 1991, 32 y.o., female Today's Date: 04/30/2022  END OF SESSION:  PT End of Session - 04/30/22 1110     Visit Number 10    Number of Visits 14    Date for PT Re-Evaluation 05/19/22    Authorization Type [place] [employer]AN    Authorization Time Period 04/30/2022 - 05/29/2022    Authorization - Visit Number 1    Authorization - Number of Visits 4    PT Start Time 1108   Pt arrived 8 min late for appointment   PT Stop Time 1148    PT Time Calculation (min) 40 min    Activity Tolerance Patient tolerated treatment well    Behavior During Therapy WFL for tasks assessed/performed               Past Medical History:  Diagnosis Date   Depression    EDS (Ehlers-Danlos syndrome)    Hyperlipidemia    Migraine    Ovarian cyst    POTS (postural orthostatic tachycardia syndrome)    Transaminitis    Vitamin B12 deficiency    Vitamin D deficiency    Past Surgical History:  Procedure Laterality Date   EYE SURGERY     x2   WISDOM TOOTH EXTRACTION     Patient Active Problem List   Diagnosis Date Noted   Primary hypertension 06/07/2020   NASH (nonalcoholic steatohepatitis) 06/26/2019   Combined hyperlipidemia 06/26/2019    PCP: Mila Palmer, MD   REFERRING PROVIDER: Camie Patience, FNP  REFERRING DIAG:  Diagnosis  M54.9 (ICD-10-CM) - Dorsalgia, unspecified    Rationale for Evaluation and Treatment: Rehabilitation  THERAPY DIAG:  Cramp and spasm  Muscle weakness (generalized)  Other low back pain  Difficulty in walking, not elsewhere classified  ONSET DATE: 02/25/2022  SUBJECTIVE:                                                                                                                                                                                           SUBJECTIVE STATEMENT: Pt reports that she felt a little soreness after last visit  and that she may have overdone it, but states that overall, she is feeling better.  PERTINENT HISTORY:  She has underlying POTS (postural orthostatic tachycardia syndrome)  PAIN:  Are you having pain? Yes: NPRS scale: 4/10 Pain location: Neck right side Pain description: tightness, aching Aggravating factors: lifting her dtr Relieving factors: ice,heat, meds, had her husband press on her back  PRECAUTIONS: None  WEIGHT BEARING RESTRICTIONS: No  FALLS:  Has patient fallen  in last 6 months? Yes. Number of falls 1 (fainted last weekend) due to POTS (postural orthostatic tachycardia syndrome)  LIVING ENVIRONMENT: Lives with: lives with their family Lives in: House/apartment  OCCUPATION: stay at home Mom  PLOF: Independent, Independent with basic ADLs, Independent with household mobility without device, Independent with community mobility without device, Independent with homemaking with ambulation, Independent with gait, and Independent with transfers  PATIENT GOALS: She hopes to learn how to manage her neck pain and be able to resume some level of exercise.    NEXT MD VISIT: prn  OBJECTIVE:   DIAGNOSTIC FINDINGS:  Xrays normal per patient  PATIENT SURVEYS:  03/24/22- NDI: 32/50 (64% disability) 04/30/2022:  Neck Disability Index score: 14 / 50 = 28.0 %  SCREENING FOR RED FLAGS: Bowel or bladder incontinence: No Spinal tumors: No Cauda equina syndrome: No Compression fracture: No Abdominal aneurysm: No  COGNITION: Overall cognitive status: Within functional limits for tasks assessed     SENSATION: WFL   POSTURE: rounded shoulders  PALPATION: Tender to palpation and trigger points along the suboccipital area and upper trap/rhomboids area   CERVICAL ROM:  Hyper mobile c spine MD is checking her for EDS Carylon Perches Danlos Syndrome)   LOWER EXTREMITY ROM:     WFL  UPPER EXTREMITY MMT:    5- to 5/5 throughout  CERVICAL SPECIAL TESTS:  Spurlings  negative  FUNCTIONAL TESTS:  5 times sit to stand: 10.0 sec Timed up and go (TUG): 7.99 sec  GAIT: Distance walked: 30 Assistive device utilized: None Level of assistance: Complete Independence Comments: na  TODAY'S TREATMENT:   DATE:  04/30/2022 Nustep level 4 (blue machine, seat at 2) x6 min with PT present to discuss status Sidelying open books x10 bilat Lower trunk rotation x10 Supine marching with posterior pelvic tilt with green band around knees 2x10 Supine horizontal abduction and shoulder ER with green theraband 2x10 bilat Supine 3 way scapular stabilization with green band x10 each Prone with 2#:  rows, shoulder flexion, horizontal abduction.  2x10 bilat each Trigger Point Dry-Needling  Treatment instructions: Expect mild to moderate muscle soreness. S/S of pneumothorax if dry needled over a lung field, and to seek immediate medical attention should they occur. Patient verbalized understanding of these instructions and education. Patient Consent Given: Yes Education handout provided: Yes Muscles treated: bilateral cervical multifidi, bilat upper trap Electrical stimulation performed: No Parameters: N/A Treatment response/outcome: skilled palpation used to assess for trigger points and tight bands cervical multifidi.  Once identified, dry needling techniques used to treat these areas.  Palpable muscle elongation and twitch response noted in the cervical multifidi and upper traps   Date 04/28/2022: UBE x 5 min (2.5 min fwd, 2.5 min reverse) TYI's over blue physio ball with 0# 2 x 10 Wall push ups with plus x 10 3 way scapular stabilization with blue loop x 5 each both 4 D ball rolls x 20 each with red plyo ball both Standing shoulder flexion and scaption with 1 lb 2 x 10 each both Ue's Prone position addaday to bilateral thoracic paraspinals and upper traps  x 8 min Added open book bilateral and thoracic extension with foam roller to HEP  Date 04/23/2022: Nustep x 5  min level 5  Standing shoulder extension, rows, bilateral shoulder ER, standing horizontal abduction x 20 each Mechanical traction Max 20 lbs, min 10 lbs, time 15 min.      PATIENT EDUCATION:  Education details: Initiated HEP and educated on lumbar spine anatomy and  natural degenerative changes Person educated: Patient Education method: Explanation, Demonstration, Verbal cues, and Handouts Education comprehension: verbalized understanding, returned demonstration, and verbal cues required  HOME EXERCISE PROGRAM: Access Code: [email or web address] URL: [email or web address] Date: 03/24/2022 Prepared by: Mikey Kirschner  Exercises - Scapular Retraction with Resistance Advanced  - 2 x daily - 7 x weekly - 2 sets - 10 reps - Scapular Retraction with Resistance  - 2 x daily - 7 x weekly - 2 sets - 10 reps - Shoulder External Rotation and Scapular Retraction with Resistance  - 2 x daily - 7 x weekly - 2 sets - 10 reps - Standing Shoulder Horizontal Abduction with Resistance  - 1 x daily - 7 x weekly - 2 sets - 10 reps   ASSESSMENT:  CLINICAL IMPRESSION: Ahlexis presents to skilled PT with reports of some soreness following last visit.  Patient able to progress with exercises and denies any pain throughout session.  Patient states that she is feeling like a migraine is coming and requests dry needling, as it typically decreases her migraine intensity.  Patient reported decreased tightness and tension following dry needling.  Patient with improved score noted on neck disability index since initial evaluation.  Patient approved from insurance for 4 additional visits (counting today).   OBJECTIVE IMPAIRMENTS: decreased strength, increased fascial restrictions, increased muscle spasms, impaired tone, impaired UE functional use, postural dysfunction, and pain.   ACTIVITY LIMITATIONS: carrying, lifting, sleeping, dressing, and caring for others  PARTICIPATION LIMITATIONS: meal prep, cleaning,  laundry, driving, shopping, occupation, and yard work  PERSONAL FACTORS: Fitness, Past/current experiences, Time since onset of injury/illness/exacerbation, and 3+ comorbidities: depression, EDS, POTS  are also affecting patient's functional outcome.   REHAB POTENTIAL: Fair multiple co-morbidities  CLINICAL DECISION MAKING: Evolving/moderate complexity  EVALUATION COMPLEXITY: Moderate   GOALS: Goals reviewed with patient? Yes  SHORT TERM GOALS: Target date: 04/21/2022    Pain report to be no greater than 4/10  Baseline: Goal status: MET  2.  Patient will be independent with initial HEP  Baseline:  Goal status: MET   LONG TERM GOALS: Target date: 05/19/2022    Patient to report pain no greater than 2/10  Baseline:  Goal status: IN PROGRESS  2.  Patient to be independent with advanced HEP  Baseline:  Goal status: IN PROGRESS  3.  Patient FOTO score to improve to predicted score Baseline:  Goal status: INITIAL  4.  Patient to report 85% improvement in overall symptoms  Baseline:  Goal status: INITIAL  5.  Functional scores to improve by 2-3 seconds each Baseline:  Goal status: INITIAL  6.  Patient to be able to resume some level of consistent exercise. Baseline:  Goal status: INITIAL  PLAN:  PT FREQUENCY: 1-2x/week  PT DURATION: 8 weeks  PLANNED INTERVENTIONS: Therapeutic exercises, Therapeutic activity, Neuromuscular re-education, Balance training, Gait training, Patient/Family education, Self Care, Joint mobilization, Stair training, Aquatic Therapy, Dry Needling, Electrical stimulation, Spinal mobilization, Cryotherapy, Moist heat, scar mobilization, Taping, Traction, Ultrasound, Ionotophoresis 4mg /ml Dexamethasone, Manual therapy, and Re-evaluation.  PLAN FOR NEXT SESSION: NuStep or UBE (take precaution for POTS),  progress postural strengthening, progress shoulder stabilization, traction, dry needling and manual techniques to control symptoms.     Reather Laurence, PT 04/30/22 11:57 AM   [facility] [address] [place], [place] [address] Phone # [phone] Fax [phone]

## 2022-05-05 ENCOUNTER — Ambulatory Visit: Payer: Medicaid Other

## 2022-05-05 DIAGNOSIS — R252 Cramp and spasm: Secondary | ICD-10-CM

## 2022-05-05 DIAGNOSIS — R293 Abnormal posture: Secondary | ICD-10-CM

## 2022-05-05 DIAGNOSIS — M6281 Muscle weakness (generalized): Secondary | ICD-10-CM

## 2022-05-05 DIAGNOSIS — M5459 Other low back pain: Secondary | ICD-10-CM | POA: Diagnosis not present

## 2022-05-05 NOTE — Therapy (Signed)
OUTPATIENT PHYSICAL THERAPY TREATMENT NOTE   Patient Name: [name] [name] MRN: [id number] DOB:[birth year], 32 y.o., female Today's Date: 05/05/2022  END OF SESSION:  PT End of Session - 05/05/22 1052     Visit Number 11    Number of Visits 14    Date for PT Re-Evaluation 05/19/22    Authorization Type [place] MEDICA[employer] PREPAID HEALTH PLAN    Authorization Time Period 04/30/2022 - 05/29/2022    Authorization - Number of Visits 11    Progress Note Due on Visit 14    PT Start Time 1013    PT Stop Time 1051    PT Time Calculation (min) 38 min    Activity Tolerance Patient tolerated treatment well    Behavior During Therapy WFL for tasks assessed/performed               Past Medical History:  Diagnosis Date   Depression    EDS (Ehlers-Danlos syndrome)    Hyperlipidemia    Migraine    Ovarian cyst    POTS (postural orthostatic tachycardia syndrome)    Transaminitis    Vitamin B12 deficiency    Vitamin D deficiency    Past Surgical History:  Procedure Laterality Date   EYE SURGERY     x2   WISDOM TOOTH EXTRACTION     Patient Active Problem List   Diagnosis Date Noted   Primary hypertension 06/07/2020   NASH (nonalcoholic steatohepatitis) 06/26/2019   Combined hyperlipidemia 06/26/2019    PCP: Mila Palmer, MD   REFERRING PROVIDER: Camie Patience, FNP  REFERRING DIAG:  Diagnosis  M54.9 (ICD-10-CM) - Dorsalgia, unspecified    Rationale for Evaluation and Treatment: Rehabilitation  THERAPY DIAG:  Cramp and spasm  Muscle weakness (generalized)  Other low back pain  Abnormal posture  ONSET DATE: 02/25/2022  SUBJECTIVE:                                                                                                                                                                                           SUBJECTIVE STATEMENT: Pt reports that she had a lot of trouble with her POTS over the weekend and wants to avoid a lot of positional changes.     PERTINENT HISTORY:  She has underlying POTS (postural orthostatic tachycardia syndrome)  PAIN:  Are you having pain? Yes: NPRS scale: 4/10 Pain location: Neck right side Pain description: tightness, aching Aggravating factors: lifting her dtr Relieving factors: ice,heat, meds, had her husband press on her back  PRECAUTIONS: None  WEIGHT BEARING RESTRICTIONS: No  FALLS:  Has patient fallen in last 6 months? Yes. Number of falls 1 (fainted last  weekend) due to POTS (postural orthostatic tachycardia syndrome)  LIVING ENVIRONMENT: Lives with: lives with their family Lives in: House/apartment  OCCUPATION: stay at home Mom  PLOF: Independent, Independent with basic ADLs, Independent with household mobility without device, Independent with community mobility without device, Independent with homemaking with ambulation, Independent with gait, and Independent with transfers  PATIENT GOALS: She hopes to learn how to manage her neck pain and be able to resume some level of exercise.    NEXT MD VISIT: prn  OBJECTIVE:   DIAGNOSTIC FINDINGS:  Xrays normal per patient  PATIENT SURVEYS:  03/24/22- NDI: 32/50 (64% disability) 04/30/2022:  Neck Disability Index score: 14 / 50 = 28.0 %  SCREENING FOR RED FLAGS: Bowel or bladder incontinence: No Spinal tumors: No Cauda equina syndrome: No Compression fracture: No Abdominal aneurysm: No  COGNITION: Overall cognitive status: Within functional limits for tasks assessed     SENSATION: WFL   POSTURE: rounded shoulders  PALPATION: Tender to palpation and trigger points along the suboccipital area and upper trap/rhomboids area   CERVICAL ROM:  Hyper mobile c spine MD is checking her for EDS Carylon Perches Danlos Syndrome)   LOWER EXTREMITY ROM:     WFL  UPPER EXTREMITY MMT:    5- to 5/5 throughout  CERVICAL SPECIAL TESTS:  Spurlings negative  FUNCTIONAL TESTS:  5 times sit to stand: 10.0 sec Timed up and go (TUG): 7.99  sec  GAIT: Distance walked: 30 Assistive device utilized: None Level of assistance: Complete Independence Comments: na  TODAY'S TREATMENT:  DATE:  05/05/2022 UBE x 5 min with PT present to discuss status Lat pull down 35# x 20 Seated row 20# x 20 Manual: Prone soft tissue mobilization to bilateral upper traps, thoracic and lumbar spine in prone,  also did cupping to these same areas.  Had patient turn over into supine to do cervical PROM, manual traction and sub occipital release.    DATE:  04/30/2022 Nustep level 4 (blue machine, seat at 2) x6 min with PT present to discuss status Sidelying open books x10 bilat Lower trunk rotation x10 Supine marching with posterior pelvic tilt with green band around knees 2x10 Supine horizontal abduction and shoulder ER with green theraband 2x10 bilat Supine 3 way scapular stabilization with green band x10 each Prone with 2#:  rows, shoulder flexion, horizontal abduction.  2x10 bilat each Trigger Point Dry-Needling  Treatment instructions: Expect mild to moderate muscle soreness. S/S of pneumothorax if dry needled over a lung field, and to seek immediate medical attention should they occur. Patient verbalized understanding of these instructions and education. Patient Consent Given: Yes Education handout provided: Yes Muscles treated: bilateral cervical multifidi, bilat upper trap Electrical stimulation performed: No Parameters: N/A Treatment response/outcome: skilled palpation used to assess for trigger points and tight bands cervical multifidi.  Once identified, dry needling techniques used to treat these areas.  Palpable muscle elongation and twitch response noted in the cervical multifidi and upper traps   Date 04/28/2022: UBE x 5 min (2.5 min fwd, 2.5 min reverse) TYI's over blue physio ball with 0# 2 x 10 Wall push ups with plus x 10 3 way scapular stabilization with blue loop x 5 each both 4 D ball rolls x 20 each with red plyo ball  both Standing shoulder flexion and scaption with 1 lb 2 x 10 each both Ue's Prone position addaday to bilateral thoracic paraspinals and upper traps  x 8 min Added open book bilateral and thoracic extension with foam roller  to HEP   PATIENT EDUCATION:  Education details: Initiated HEP and educated on lumbar spine anatomy and natural degenerative changes Person educated: Patient Education method: Programmer, multimedia, Facilities manager, Verbal cues, and Handouts Education comprehension: verbalized understanding, returned demonstration, and verbal cues required  HOME EXERCISE PROGRAM: Access Code: [email or web address] URL: [email or web address] Date: 03/24/2022 Prepared by: Mikey Kirschner  Exercises - Scapular Retraction with Resistance Advanced  - 2 x daily - 7 x weekly - 2 sets - 10 reps - Scapular Retraction with Resistance  - 2 x daily - 7 x weekly - 2 sets - 10 reps - Shoulder External Rotation and Scapular Retraction with Resistance  - 2 x daily - 7 x weekly - 2 sets - 10 reps - Standing Shoulder Horizontal Abduction with Resistance  - 1 x daily - 7 x weekly - 2 sets - 10 reps   ASSESSMENT:  CLINICAL IMPRESSION: Tahmina responds well to soft tissue mobilization and seems compliant with her HEP.  She has 3 more visits left.  We will re-assess at this time for DC vs extension of PT.    OBJECTIVE IMPAIRMENTS: decreased strength, increased fascial restrictions, increased muscle spasms, impaired tone, impaired UE functional use, postural dysfunction, and pain.   ACTIVITY LIMITATIONS: carrying, lifting, sleeping, dressing, and caring for others  PARTICIPATION LIMITATIONS: meal prep, cleaning, laundry, driving, shopping, occupation, and yard work  PERSONAL FACTORS: Fitness, Past/current experiences, Time since onset of injury/illness/exacerbation, and 3+ comorbidities: depression, EDS, POTS  are also affecting patient's functional outcome.   REHAB POTENTIAL: Fair multiple  co-morbidities  CLINICAL DECISION MAKING: Evolving/moderate complexity  EVALUATION COMPLEXITY: Moderate   GOALS: Goals reviewed with patient? Yes  SHORT TERM GOALS: Target date: 04/21/2022    Pain report to be no greater than 4/10  Baseline: Goal status: MET  2.  Patient will be independent with initial HEP  Baseline:  Goal status: MET   LONG TERM GOALS: Target date: 05/19/2022    Patient to report pain no greater than 2/10  Baseline:  Goal status: IN PROGRESS  2.  Patient to be independent with advanced HEP  Baseline:  Goal status: IN PROGRESS  3.  Patient FOTO score to improve to predicted score Baseline:  Goal status: INITIAL  4.  Patient to report 85% improvement in overall symptoms  Baseline:  Goal status: INITIAL  5.  Functional scores to improve by 2-3 seconds each Baseline:  Goal status: INITIAL  6.  Patient to be able to resume some level of consistent exercise. Baseline:  Goal status: INITIAL  PLAN:  PT FREQUENCY: 1-2x/week  PT DURATION: 8 weeks  PLANNED INTERVENTIONS: Therapeutic exercises, Therapeutic activity, Neuromuscular re-education, Balance training, Gait training, Patient/Family education, Self Care, Joint mobilization, Stair training, Aquatic Therapy, Dry Needling, Electrical stimulation, Spinal mobilization, Cryotherapy, Moist heat, scar mobilization, Taping, Traction, Ultrasound, Ionotophoresis 4mg /ml Dexamethasone, Manual therapy, and Re-evaluation.  PLAN FOR NEXT SESSION: NuStep or UBE (take precaution for POTS),  progress postural strengthening, progress shoulder stabilization, traction, dry needling and manual techniques to control symptoms.    Victorino Dike B. Oriana Horiuchi, PT 05/05/22 10:59 AM   [facility] [address] [place], [place] [address] Phone # [phone] Fax [phone]

## 2022-05-07 ENCOUNTER — Ambulatory Visit: Payer: Medicaid Other | Attending: Family Medicine

## 2022-05-07 DIAGNOSIS — M5459 Other low back pain: Secondary | ICD-10-CM | POA: Insufficient documentation

## 2022-05-07 DIAGNOSIS — R293 Abnormal posture: Secondary | ICD-10-CM | POA: Insufficient documentation

## 2022-05-07 DIAGNOSIS — M6281 Muscle weakness (generalized): Secondary | ICD-10-CM | POA: Diagnosis present

## 2022-05-07 DIAGNOSIS — R262 Difficulty in walking, not elsewhere classified: Secondary | ICD-10-CM | POA: Insufficient documentation

## 2022-05-07 DIAGNOSIS — R252 Cramp and spasm: Secondary | ICD-10-CM | POA: Diagnosis present

## 2022-05-07 NOTE — Therapy (Signed)
OUTPATIENT PHYSICAL THERAPY TREATMENT NOTE   Patient Name: [name] [name] MRN: [id number] DOB:Mar 26, 1991, 32 y.o., female Today's Date: 05/07/2022  END OF SESSION:  PT End of Session - 05/07/22 1112     Visit Number 12    Number of Visits 14    Date for PT Re-Evaluation 05/19/22    Authorization Type [place] MEDICAID PREPAID HEALTH PLAN    Authorization Time Period 04/30/2022 - 05/29/2022    Authorization - Number of Visits 12    Progress Note Due on Visit 14    PT Start Time 1100    PT Stop Time 1147    PT Time Calculation (min) 47 min    Activity Tolerance Patient tolerated treatment well    Behavior During Therapy WFL for tasks assessed/performed               Past Medical History:  Diagnosis Date   Depression    EDS (Ehlers-Danlos syndrome)    Hyperlipidemia    Migraine    Ovarian cyst    POTS (postural orthostatic tachycardia syndrome)    Transaminitis    Vitamin B12 deficiency    Vitamin D deficiency    Past Surgical History:  Procedure Laterality Date   EYE SURGERY     x2   WISDOM TOOTH EXTRACTION     Patient Active Problem List   Diagnosis Date Noted   Primary hypertension 06/07/2020   NASH (nonalcoholic steatohepatitis) 06/26/2019   Combined hyperlipidemia 06/26/2019    PCP: Mila Palmer, MD   REFERRING PROVIDER: Camie Patience, FNP  REFERRING DIAG:  Diagnosis  M54.9 (ICD-10-CM) - Dorsalgia, unspecified    Rationale for Evaluation and Treatment: Rehabilitation  THERAPY DIAG:  Muscle weakness (generalized)  Other low back pain  Cramp and spasm  Abnormal posture  Difficulty in walking, not elsewhere classified  ONSET DATE: 02/25/2022  SUBJECTIVE:                                                                                                                                                                                           SUBJECTIVE STATEMENT: Pt reports that she had a lot of trouble with her POTS over the weekend and wants  to avoid a lot of positional changes.    PERTINENT HISTORY:  She has underlying POTS (postural orthostatic tachycardia syndrome)  PAIN:  Are you having pain? Yes: NPRS scale: 4/10 Pain location: Neck right side Pain description: tightness, aching Aggravating factors: lifting her dtr Relieving factors: ice,heat, meds, had her husband press on her back  PRECAUTIONS: None  WEIGHT BEARING RESTRICTIONS: No  FALLS:  Has patient fallen in last 6 months?  Yes. Number of falls 1 (fainted last weekend) due to POTS (postural orthostatic tachycardia syndrome)  LIVING ENVIRONMENT: Lives with: lives with their family Lives in: House/apartment  OCCUPATION: stay at home Mom  PLOF: Independent, Independent with basic ADLs, Independent with household mobility without device, Independent with community mobility without device, Independent with homemaking with ambulation, Independent with gait, and Independent with transfers  PATIENT GOALS: She hopes to learn how to manage her neck pain and be able to resume some level of exercise.    NEXT MD VISIT: prn  OBJECTIVE:   DIAGNOSTIC FINDINGS:  Xrays normal per patient  PATIENT SURVEYS:  03/24/22- NDI: 32/50 (64% disability) 04/30/2022:  Neck Disability Index score: 14 / 50 = 28.0 %  SCREENING FOR RED FLAGS: Bowel or bladder incontinence: No Spinal tumors: No Cauda equina syndrome: No Compression fracture: No Abdominal aneurysm: No  COGNITION: Overall cognitive status: Within functional limits for tasks assessed     SENSATION: WFL   POSTURE: rounded shoulders  PALPATION: Tender to palpation and trigger points along the suboccipital area and upper trap/rhomboids area   CERVICAL ROM:  Hyper mobile c spine MD is checking her for EDS Carylon Perches Danlos Syndrome)   LOWER EXTREMITY ROM:     WFL  UPPER EXTREMITY MMT:    5- to 5/5 throughout  CERVICAL SPECIAL TESTS:  Spurlings negative  FUNCTIONAL TESTS:  5 times sit to stand:  10.0 sec Timed up and go (TUG): 7.99 sec  GAIT: Distance walked: 30 Assistive device utilized: None Level of assistance: Complete Independence Comments: na  TODAY'S TREATMENT:  Date 05/07/2022: UBE x 5 min (2.5 min fwd, 2.5 min reverse) 3 way scapular stabilization with blue loop x 5 each both 4 D ball rolls x 20 each with red plyo ball both Fwd bent with knees in chair 3 lb : shoulder extension, rows, and horizontal abduction x 20 each UE Mechanical cervical traction 20 lbs max, 10 lbs min, 100% speed, 60 sec hold, 10 sec rest.    DATE:  05/05/2022 UBE x 5 min with PT present to discuss status Lat pull down 35# x 20 Seated row 20# x 20 Manual: Prone soft tissue mobilization to bilateral upper traps, thoracic and lumbar spine in prone,  also did cupping to these same areas.  Had patient turn over into supine to do cervical PROM, manual traction and sub occipital release.    DATE:  04/30/2022 Nustep level 4 (blue machine, seat at 2) x6 min with PT present to discuss status Sidelying open books x10 bilat Lower trunk rotation x10 Supine marching with posterior pelvic tilt with green band around knees 2x10 Supine horizontal abduction and shoulder ER with green theraband 2x10 bilat Supine 3 way scapular stabilization with green band x10 each Prone with 2#:  rows, shoulder flexion, horizontal abduction.  2x10 bilat each Trigger Point Dry-Needling  Treatment instructions: Expect mild to moderate muscle soreness. S/S of pneumothorax if dry needled over a lung field, and to seek immediate medical attention should they occur. Patient verbalized understanding of these instructions and education. Patient Consent Given: Yes Education handout provided: Yes Muscles treated: bilateral cervical multifidi, bilat upper trap Electrical stimulation performed: No Parameters: N/A Treatment response/outcome: skilled palpation used to assess for trigger points and tight bands cervical multifidi.  Once  identified, dry needling techniques used to treat these areas.  Palpable muscle elongation and twitch response noted in the cervical multifidi and upper traps  PATIENT EDUCATION:  Education details: Initiated HEP and educated on  lumbar spine anatomy and natural degenerative changes Person educated: Patient Education method: Explanation, Demonstration, Verbal cues, and Handouts Education comprehension: verbalized understanding, returned demonstration, and verbal cues required  HOME EXERCISE PROGRAM: Access Code: [email or web address] URL: [email or web address] Date: 03/24/2022 Prepared by: Mikey Kirschner  Exercises - Scapular Retraction with Resistance Advanced  - 2 x daily - 7 x weekly - 2 sets - 10 reps - Scapular Retraction with Resistance  - 2 x daily - 7 x weekly - 2 sets - 10 reps - Shoulder External Rotation and Scapular Retraction with Resistance  - 2 x daily - 7 x weekly - 2 sets - 10 reps - Standing Shoulder Horizontal Abduction with Resistance  - 1 x daily - 7 x weekly - 2 sets - 10 reps   ASSESSMENT:  CLINICAL IMPRESSION: Stav has been having more difficulty with her POTS in the past week which has made it difficult for her to do her exercises at home.  She struggled through her shoulder exercises in fwd bend position today, fatiguing quite easily.  She would benefit from continuing skilled PT for postural strengthening and core strengthening along with bilateral scapular and shoulder stability training to reduce thoracic and low back pain.     OBJECTIVE IMPAIRMENTS: decreased strength, increased fascial restrictions, increased muscle spasms, impaired tone, impaired UE functional use, postural dysfunction, and pain.   ACTIVITY LIMITATIONS: carrying, lifting, sleeping, dressing, and caring for others  PARTICIPATION LIMITATIONS: meal prep, cleaning, laundry, driving, shopping, occupation, and yard work  PERSONAL FACTORS: Fitness, Past/current experiences, Time since  onset of injury/illness/exacerbation, and 3+ comorbidities: depression, EDS, POTS  are also affecting patient's functional outcome.   REHAB POTENTIAL: Fair multiple co-morbidities  CLINICAL DECISION MAKING: Evolving/moderate complexity  EVALUATION COMPLEXITY: Moderate   GOALS: Goals reviewed with patient? Yes  SHORT TERM GOALS: Target date: 04/21/2022    Pain report to be no greater than 4/10  Baseline: Goal status: MET  2.  Patient will be independent with initial HEP  Baseline:  Goal status: MET   LONG TERM GOALS: Target date: 05/19/2022    Patient to report pain no greater than 2/10  Baseline:  Goal status: IN PROGRESS  2.  Patient to be independent with advanced HEP  Baseline:  Goal status: IN PROGRESS  3.  Patient FOTO score to improve to predicted score Baseline:  Goal status: IN PROGRESS  4.  Patient to report 85% improvement in overall symptoms  Baseline:  Goal status: IN PROGRESS  5.  Functional scores to improve by 2-3 seconds each Baseline:  Goal status: IN PROGRESS  6.  Patient to be able to resume some level of consistent exercise. Baseline:  Goal status: IN PROGRESS  PLAN:  PT FREQUENCY: 1-2x/week  PT DURATION: 8 weeks  PLANNED INTERVENTIONS: Therapeutic exercises, Therapeutic activity, Neuromuscular re-education, Balance training, Gait training, Patient/Family education, Self Care, Joint mobilization, Stair training, Aquatic Therapy, Dry Needling, Electrical stimulation, Spinal mobilization, Cryotherapy, Moist heat, scar mobilization, Taping, Traction, Ultrasound, Ionotophoresis 4mg /ml Dexamethasone, Manual therapy, and Re-evaluation.  PLAN FOR NEXT SESSION: NuStep or UBE (take precaution for POTS),  progress postural strengthening, progress shoulder stabilization, traction, dry needling and manual techniques to control symptoms.    Victorino Dike B. Nunzio Banet, PT 05/07/22 11:38 AM   [facility] [address] [place], [place] [address] Phone # [phone] Fax [phone]

## 2022-05-12 ENCOUNTER — Ambulatory Visit: Payer: Medicaid Other

## 2022-05-12 DIAGNOSIS — M5459 Other low back pain: Secondary | ICD-10-CM

## 2022-05-12 DIAGNOSIS — R262 Difficulty in walking, not elsewhere classified: Secondary | ICD-10-CM

## 2022-05-12 DIAGNOSIS — R252 Cramp and spasm: Secondary | ICD-10-CM

## 2022-05-12 DIAGNOSIS — M6281 Muscle weakness (generalized): Secondary | ICD-10-CM | POA: Diagnosis not present

## 2022-05-12 DIAGNOSIS — R293 Abnormal posture: Secondary | ICD-10-CM

## 2022-05-12 NOTE — Therapy (Signed)
OUTPATIENT PHYSICAL THERAPY TREATMENT NOTE   Patient Name: [name] [name] MRN: [id number] DOB:[birth year], 32 y.o., female Today's Date: 05/12/2022  END OF SESSION:  PT End of Session - 05/12/22 0947     Visit Number 13    Number of Visits 14    Date for PT Re-Evaluation 05/19/22    Authorization Type [place] MEDICAID PREPAID HEALTH PLAN    Authorization Time Period 04/30/2022 - 05/29/2022    Authorization - Visit Number 13    Authorization - Number of Visits 14    Progress Note Due on Visit 14    PT Start Time 0930    PT Stop Time 1010    PT Time Calculation (min) 40 min    Activity Tolerance Patient tolerated treatment well    Behavior During Therapy WFL for tasks assessed/performed               Past Medical History:  Diagnosis Date   Depression    EDS (Ehlers-Danlos syndrome)    Hyperlipidemia    Migraine    Ovarian cyst    POTS (postural orthostatic tachycardia syndrome)    Transaminitis    Vitamin B12 deficiency    Vitamin D deficiency    Past Surgical History:  Procedure Laterality Date   EYE SURGERY     x2   WISDOM TOOTH EXTRACTION     Patient Active Problem List   Diagnosis Date Noted   Primary hypertension 06/07/2020   NASH (nonalcoholic steatohepatitis) 06/26/2019   Combined hyperlipidemia 06/26/2019    PCP: Mila Palmer, MD   REFERRING PROVIDER: Camie Patience, FNP  REFERRING DIAG:  Diagnosis  M54.9 (ICD-10-CM) - Dorsalgia, unspecified    Rationale for Evaluation and Treatment: Rehabilitation  THERAPY DIAG:  Muscle weakness (generalized)  Other low back pain  Cramp and spasm  Abnormal posture  Difficulty in walking, not elsewhere classified  ONSET DATE: 02/25/2022  SUBJECTIVE:                                                                                                                                                                                           SUBJECTIVE STATEMENT: Pt reports that she had several headaches  over the weekend and is sore in her shoulders.     PERTINENT HISTORY:  She has underlying POTS (postural orthostatic tachycardia syndrome)  PAIN:  Are you having pain? Yes: NPRS scale: 4/10 Pain location: Neck right side Pain description: tightness, aching Aggravating factors: lifting her dtr Relieving factors: ice,heat, meds, had her husband press on her back  PRECAUTIONS: None  WEIGHT BEARING RESTRICTIONS: No  FALLS:  Has patient fallen in last 6  months? Yes. Number of falls 1 (fainted last weekend) due to POTS (postural orthostatic tachycardia syndrome)  LIVING ENVIRONMENT: Lives with: lives with their family Lives in: House/apartment  OCCUPATION: stay at home Mom  PLOF: Independent, Independent with basic ADLs, Independent with household mobility without device, Independent with community mobility without device, Independent with homemaking with ambulation, Independent with gait, and Independent with transfers  PATIENT GOALS: She hopes to learn how to manage her neck pain and be able to resume some level of exercise.    NEXT MD VISIT: prn  OBJECTIVE:   DIAGNOSTIC FINDINGS:  Xrays normal per patient  PATIENT SURVEYS:  03/24/22- NDI: 32/50 (64% disability) 04/30/2022:  Neck Disability Index score: 14 / 50 = 28.0 %  SCREENING FOR RED FLAGS: Bowel or bladder incontinence: No Spinal tumors: No Cauda equina syndrome: No Compression fracture: No Abdominal aneurysm: No  COGNITION: Overall cognitive status: Within functional limits for tasks assessed     SENSATION: WFL   POSTURE: rounded shoulders  PALPATION: Tender to palpation and trigger points along the suboccipital area and upper trap/rhomboids area   CERVICAL ROM:  Hyper mobile c spine MD is checking her for EDS Carylon Perches Danlos Syndrome)   LOWER EXTREMITY ROM:     WFL  UPPER EXTREMITY MMT:    5- to 5/5 throughout  CERVICAL SPECIAL TESTS:  Spurlings negative  FUNCTIONAL TESTS:  5 times sit to  stand: 10.0 sec Timed up and go (TUG): 7.99 sec  GAIT: Distance walked: 30 Assistive device utilized: None Level of assistance: Complete Independence Comments: na  TODAY'S TREATMENT:  Date 05/12/2022: UBE x 5 min (2.5 min fwd, 2.5 min reverse) TYI's over red physio ball with ball in chair x 10 Lat pull down in standing 25# x 20 Seated Row (Matrix) 25# x 20 Manual: prone soft tissue mobilization to bilateral upper traps, levator scap and entire spine along paraspinals, also addressed rhomboids.  Sub occipital release and scalp massage.  X 8 min  Date 05/07/2022: UBE x 5 min (2.5 min fwd, 2.5 min reverse) 3 way scapular stabilization with blue loop x 5 each both 4 D ball rolls x 20 each with red plyo ball both Fwd bent with knees in chair 3 lb : shoulder extension, rows, and horizontal abduction x 20 each UE Mechanical cervical traction 20 lbs max, 10 lbs min, 100% speed, 60 sec hold, 10 sec rest.    DATE:  05/05/2022 UBE x 5 min with PT present to discuss status Lat pull down 35# x 20 Seated row 20# x 20 Manual: Prone soft tissue mobilization to bilateral upper traps, thoracic and lumbar spine in prone,  also did cupping to these same areas.  Had patient turn over into supine to do cervical PROM, manual traction and sub occipital release.    DATE:  04/30/2022 Nustep level 4 (blue machine, seat at 2) x6 min with PT present to discuss status Sidelying open books x10 bilat Lower trunk rotation x10 Supine marching with posterior pelvic tilt with green band around knees 2x10 Supine horizontal abduction and shoulder ER with green theraband 2x10 bilat Supine 3 way scapular stabilization with green band x10 each Prone with 2#:  rows, shoulder flexion, horizontal abduction.  2x10 bilat each Trigger Point Dry-Needling  Treatment instructions: Expect mild to moderate muscle soreness. S/S of pneumothorax if dry needled over a lung field, and to seek immediate medical attention should they  occur. Patient verbalized understanding of these instructions and education. Patient Consent Given: Yes Education  handout provided: Yes Muscles treated: bilateral cervical multifidi, bilat upper trap Electrical stimulation performed: No Parameters: N/A Treatment response/outcome: skilled palpation used to assess for trigger points and tight bands cervical multifidi.  Once identified, dry needling techniques used to treat these areas.  Palpable muscle elongation and twitch response noted in the cervical multifidi and upper traps  PATIENT EDUCATION:  Education details: Initiated HEP and educated on lumbar spine anatomy and natural degenerative changes Person educated: Patient Education method: Programmer, multimedia, Facilities manager, Verbal cues, and Handouts Education comprehension: verbalized understanding, returned demonstration, and verbal cues required  HOME EXERCISE PROGRAM: Access Code: [email or web address] URL: [email or web address] Date: 03/24/2022 Prepared by: Mikey Kirschner  Exercises - Scapular Retraction with Resistance Advanced  - 2 x daily - 7 x weekly - 2 sets - 10 reps - Scapular Retraction with Resistance  - 2 x daily - 7 x weekly - 2 sets - 10 reps - Shoulder External Rotation and Scapular Retraction with Resistance  - 2 x daily - 7 x weekly - 2 sets - 10 reps - Standing Shoulder Horizontal Abduction with Resistance  - 1 x daily - 7 x weekly - 2 sets - 10 reps   ASSESSMENT:  CLINICAL IMPRESSION: [name] [name] has met somewhat of a plateau.  She has been having more headaches and fatigue.  She feels her POTS has been worse lately.  She understands that a regular exercise routine or at least walking would likely improve her overall stamina.  She is trying to figure out how to fit this into her day with caring for her daughter and all of her other tasks throughout the day.   She would benefit from continuing skilled PT for postural strengthening and core strengthening along with bilateral  scapular and shoulder stability training to reduce thoracic and low back pain.     OBJECTIVE IMPAIRMENTS: decreased strength, increased fascial restrictions, increased muscle spasms, impaired tone, impaired UE functional use, postural dysfunction, and pain.   ACTIVITY LIMITATIONS: carrying, lifting, sleeping, dressing, and caring for others  PARTICIPATION LIMITATIONS: meal prep, cleaning, laundry, driving, shopping, occupation, and yard work  PERSONAL FACTORS: Fitness, Past/current experiences, Time since onset of injury/illness/exacerbation, and 3+ comorbidities: depression, EDS, POTS  are also affecting patient's functional outcome.   REHAB POTENTIAL: Fair multiple co-morbidities  CLINICAL DECISION MAKING: Evolving/moderate complexity  EVALUATION COMPLEXITY: Moderate   GOALS: Goals reviewed with patient? Yes  SHORT TERM GOALS: Target date: 04/21/2022    Pain report to be no greater than 4/10  Baseline: Goal status: MET  2.  Patient will be independent with initial HEP  Baseline:  Goal status: MET   LONG TERM GOALS: Target date: 05/19/2022    Patient to report pain no greater than 2/10  Baseline:  Goal status: IN PROGRESS  2.  Patient to be independent with advanced HEP  Baseline:  Goal status: IN PROGRESS  3.  Patient FOTO score to improve to predicted score Baseline:  Goal status: IN PROGRESS  4.  Patient to report 85% improvement in overall symptoms  Baseline:  Goal status: IN PROGRESS  5.  Functional scores to improve by 2-3 seconds each Baseline:  Goal status: IN PROGRESS  6.  Patient to be able to resume some level of consistent exercise. Baseline:  Goal status: IN PROGRESS  PLAN:  PT FREQUENCY: 1-2x/week  PT DURATION: 8 weeks  PLANNED INTERVENTIONS: Therapeutic exercises, Therapeutic activity, Neuromuscular re-education, Balance training, Gait training, Patient/Family education, Self Care, Joint mobilization, Stair training, Aquatic Therapy, Dry  Needling, Electrical stimulation,  Spinal mobilization, Cryotherapy, Moist heat, scar mobilization, Taping, Traction, Ultrasound, Ionotophoresis 4mg /ml Dexamethasone, Manual therapy, and Re-evaluation.  PLAN FOR NEXT SESSION: NuStep or UBE (take precaution for POTS),  progress postural strengthening, progress shoulder stabilization, traction, dry needling and manual techniques to control symptoms.    Victorino Dike B. Jozy Mcphearson, PT 05/12/22 10:40 AM   [facility] [address] [place], [place] [address] Phone # [phone] Fax [phone]

## 2022-05-14 ENCOUNTER — Ambulatory Visit: Payer: Medicaid Other

## 2022-05-14 DIAGNOSIS — M6281 Muscle weakness (generalized): Secondary | ICD-10-CM

## 2022-05-14 DIAGNOSIS — R252 Cramp and spasm: Secondary | ICD-10-CM

## 2022-05-14 DIAGNOSIS — M5459 Other low back pain: Secondary | ICD-10-CM

## 2022-05-14 DIAGNOSIS — R293 Abnormal posture: Secondary | ICD-10-CM

## 2022-05-14 NOTE — Therapy (Addendum)
OUTPATIENT PHYSICAL THERAPY TREATMENT NOTE PHYSICAL THERAPY DISCHARGE SUMMARY  Visits from Start of Care: 14  Current functional level related to goals / functional outcomes: See below   Remaining deficits: See below   Education / Equipment: See below   Patient agrees to discharge. Patient goals were partially met. Patient is being discharged due to  insurance denied request for further appointments.    Patient Name: [name] [name] MRN: [id number] DOB:June 02, 1990, 32 y.o., female Today's Date: 05/14/2022  END OF SESSION:  PT End of Session - 05/14/22 1020     Visit Number 14    Number of Visits 14    Date for PT Re-Evaluation 05/19/22    Authorization Type [place] MEDICAID PREPAID HEALTH PLAN    Authorization Time Period 04/30/2022 - 05/29/2022    Authorization - Visit Number 14    Authorization - Number of Visits 14    Progress Note Due on Visit 14    PT Start Time 1015    PT Stop Time 1058    PT Time Calculation (min) 43 min    Activity Tolerance Patient tolerated treatment well    Behavior During Therapy WFL for tasks assessed/performed               Past Medical History:  Diagnosis Date   Depression    EDS (Ehlers-Danlos syndrome)    Hyperlipidemia    Migraine    Ovarian cyst    POTS (postural orthostatic tachycardia syndrome)    Transaminitis    Vitamin B12 deficiency    Vitamin D deficiency    Past Surgical History:  Procedure Laterality Date   EYE SURGERY     x2   WISDOM TOOTH EXTRACTION     Patient Active Problem List   Diagnosis Date Noted   Primary hypertension 06/07/2020   NASH (nonalcoholic steatohepatitis) 06/26/2019   Combined hyperlipidemia 06/26/2019    PCP: Mila Palmer, MD   REFERRING PROVIDER: Camie Patience, FNP  REFERRING DIAG:  Diagnosis  M54.9 (ICD-10-CM) - Dorsalgia, unspecified    Rationale for Evaluation and Treatment: Rehabilitation  THERAPY DIAG:  Muscle weakness (generalized)  Other low back pain  Cramp  and spasm  Abnormal posture  ONSET DATE: 02/25/2022  SUBJECTIVE:                                                                                                                                                                                           SUBJECTIVE STATEMENT: Pt reports that she is doing well today.   She explains that her headaches are less frequent and don't last as long.     PERTINENT HISTORY:  She has underlying POTS (postural orthostatic tachycardia syndrome)  PAIN:  Are you having pain? Yes: NPRS scale: 1/10 Pain location: Neck right side Pain description: tightness, aching Aggravating factors: lifting her dtr Relieving factors: ice,heat, meds, had her husband press on her back  PRECAUTIONS: None  WEIGHT BEARING RESTRICTIONS: No  FALLS:  Has patient fallen in last 6 months? Yes. Number of falls 1 (fainted last weekend) due to POTS (postural orthostatic tachycardia syndrome) Patient has had only one episode of POTS since starting PT LIVING ENVIRONMENT: Lives with: lives with their family Lives in: House/apartment  OCCUPATION: stay at home Mom  PLOF: Independent, Independent with basic ADLs, Independent with household mobility without device, Independent with community mobility without device, Independent with homemaking with ambulation, Independent with gait, and Independent with transfers  PATIENT GOALS: She hopes to learn how to manage her neck pain and be able to resume some level of exercise.    NEXT MD VISIT: prn  OBJECTIVE:   DIAGNOSTIC FINDINGS:  Xrays normal per patient  PATIENT SURVEYS:  03/24/22- NDI: 32/50 (64% disability) 04/30/2022:  Neck Disability Index score: 14 / 50 = 28% disability 05/14/2022:  Neck Disability Index score: 10 / 50 = 20% disability    SCREENING FOR RED FLAGS: Bowel or bladder incontinence: No Spinal tumors: No Cauda equina syndrome: No Compression fracture: No Abdominal aneurysm: No  COGNITION: Overall  cognitive status: Within functional limits for tasks assessed     SENSATION: WFL   POSTURE: rounded shoulders  PALPATION: Tender to palpation and trigger points along the suboccipital area and upper trap/rhomboids area   CERVICAL ROM:  Hyper mobile c spine MD is checking her for EDS Carylon Perches Danlos Syndrome)   LOWER EXTREMITY ROM:     WFL  UPPER EXTREMITY MMT:    5/5 throughout  CERVICAL SPECIAL TESTS:  Spurlings negative  FUNCTIONAL TESTS:  5 times sit to stand: 10.0 sec Timed up and go (TUG): 7.99 sec 05/14/22: 5 times sit to stand: 7.22 sec Timed up and go (TUG): 6.19 sec  GAIT: Distance walked: 30 Assistive device utilized: None Level of assistance: Complete Independence Comments: na  TODAY'S TREATMENT:  Date 05/14/2022: UBE x 5 min (2.5 min fwd, 2.5 min reverse) Re-assessment for auth Reviewed all of HEP and provided updated HEP  Date 05/12/2022: UBE x 5 min (2.5 min fwd, 2.5 min reverse) TYI's over red physio ball with ball in chair x 10 Lat pull down in standing 25# x 20 Seated Row (Matrix) 25# x 20 Manual: prone soft tissue mobilization to bilateral upper traps, levator scap and entire spine along paraspinals, also addressed rhomboids.  Sub occipital release and scalp massage.  X 8 min  Date 05/07/2022: UBE x 5 min (2.5 min fwd, 2.5 min reverse) 3 way scapular stabilization with blue loop x 5 each both 4 D ball rolls x 20 each with red plyo ball both Fwd bent with knees in chair 3 lb : shoulder extension, rows, and horizontal abduction x 20 each UE Mechanical cervical traction 20 lbs max, 10 lbs min, 100% speed, 60 sec hold, 10 sec rest.    DATE:  05/05/2022 UBE x 5 min with PT present to discuss status Lat pull down 35# x 20 Seated row 20# x 20 Manual: Prone soft tissue mobilization to bilateral upper traps, thoracic and lumbar spine in prone,  also did cupping to these same areas.  Had patient turn over into supine to do cervical PROM, manual  traction and sub occipital  release.     PATIENT EDUCATION:  Education details: Initiated HEP and educated on lumbar spine anatomy and natural degenerative changes Person educated: Patient Education method: Programmer, multimedia, Facilities manager, Verbal cues, and Handouts Education comprehension: verbalized understanding, returned demonstration, and verbal cues required  HOME EXERCISE PROGRAM: Access Code: [email or web address] URL: [email or web address] Date: 03/24/2022 Prepared by: Mikey Kirschner  Exercises - Scapular Retraction with Resistance Advanced  - 2 x daily - 7 x weekly - 2 sets - 10 reps - Scapular Retraction with Resistance  - 2 x daily - 7 x weekly - 2 sets - 10 reps - Shoulder External Rotation and Scapular Retraction with Resistance  - 2 x daily - 7 x weekly - 2 sets - 10 reps - Standing Shoulder Horizontal Abduction with Resistance  - 1 x daily - 7 x weekly - 2 sets - 10 reps   ASSESSMENT:  CLINICAL IMPRESSION: Cyenna was leveling out in the past few visits but has rebounded from the few days of her POTS flare up and migraine and is doing well now.  She demonstrates significant improvement in all objective findings and functional tests.  When reviewing HEP today, she needed several verbal cues to correct technique.  She is having less frequent headaches.  They are about the same in intensity but duration has decreased as well.   She understands that a regular exercise routine or at least walking would likely improve her overall stamina.  She is trying to figure out how to fit this into her day with caring for her daughter and all of her other tasks throughout the day.   She would benefit from continuing skilled PT for a few more visits to focus on postural strengthening and core strengthening along with bilateral scapular and shoulder stability training to reduce thoracic and low back pain.     OBJECTIVE IMPAIRMENTS: decreased strength, increased fascial restrictions, increased  muscle spasms, impaired tone, impaired UE functional use, postural dysfunction, and pain.   ACTIVITY LIMITATIONS: carrying, lifting, sleeping, dressing, and caring for others  PARTICIPATION LIMITATIONS: meal prep, cleaning, laundry, driving, shopping, occupation, and yard work  PERSONAL FACTORS: Fitness, Past/current experiences, Time since onset of injury/illness/exacerbation, and 3+ comorbidities: depression, EDS, POTS  are also affecting patient's functional outcome.   REHAB POTENTIAL: Fair multiple co-morbidities  CLINICAL DECISION MAKING: Evolving/moderate complexity  EVALUATION COMPLEXITY: Moderate   GOALS: Goals reviewed with patient? Yes  SHORT TERM GOALS: Target date: 04/21/2022    Pain report to be no greater than 4/10  Baseline: Goal status: MET  2.  Patient will be independent with initial HEP  Baseline:  Goal status: MET   LONG TERM GOALS: Target date: 05/19/2022    Patient to report pain no greater than 2/10  Baseline:  Goal status: MET  2.  Patient to be independent with advanced HEP  Baseline:  Goal status: IN PROGRESS  3.  Patient FOTO score to improve to predicted score Baseline:  Goal status: REVISED (FOTO not done due to insurance)  4.  Patient to report 85% improvement in overall symptoms  Baseline:  Goal status: IN PROGRESS  5.  Functional scores to improve by 2-3 seconds each Baseline:  Goal status: MET  6.  Patient to be able to resume some level of consistent exercise. Baseline:  Goal status: IN PROGRESS  PLAN:  PT FREQUENCY: 1-2x/week  PT DURATION: 8 weeks  PLANNED INTERVENTIONS: Therapeutic exercises, Therapeutic activity, Neuromuscular re-education, Balance training, Gait training, Patient/Family education, Self Care, Joint mobilization, Stair training, Aquatic  Therapy, Dry Needling, Electrical stimulation, Spinal mobilization, Cryotherapy, Moist heat, scar mobilization, Taping, Traction, Ultrasound, Ionotophoresis 4mg /ml  Dexamethasone, Manual therapy, and Re-evaluation.  PLAN FOR NEXT SESSION: NuStep or UBE (take precaution for POTS),  progress postural and core strengthening, progress shoulder stabilization, traction, dry needling and manual techniques to control symptoms.    Victorino Dike B. Collyns Mcquigg, PT 05/19/22 8:22 AM  Victorino Dike B. Wyatt Galvan, PT 05/14/22 11:35 AM  [facility] [address], [address] [place], [place] [address] Phone # [phone] Fax [phone]

## 2022-05-19 ENCOUNTER — Ambulatory Visit: Payer: Medicaid Other

## 2022-07-27 LAB — HEPATITIS C ANTIBODY: HCV Ab: NEGATIVE

## 2022-07-27 LAB — OB RESULTS CONSOLE RUBELLA ANTIBODY, IGM: Rubella: IMMUNE

## 2022-07-27 LAB — OB RESULTS CONSOLE HIV ANTIBODY (ROUTINE TESTING): HIV: NONREACTIVE

## 2022-07-27 LAB — OB RESULTS CONSOLE HEPATITIS B SURFACE ANTIGEN: Hepatitis B Surface Ag: NEGATIVE

## 2022-08-17 ENCOUNTER — Other Ambulatory Visit (HOSPITAL_COMMUNITY)
Admission: RE | Admit: 2022-08-17 | Discharge: 2022-08-17 | Disposition: A | Discharge status: Home / Self Care | Payer: Medicaid Other | Source: Ambulatory Visit | Attending: Obstetrics and Gynecology | Admitting: Obstetrics and Gynecology

## 2022-08-17 ENCOUNTER — Other Ambulatory Visit: Payer: Self-pay | Admitting: Obstetrics and Gynecology

## 2022-08-17 DIAGNOSIS — Z124 Encounter for screening for malignant neoplasm of cervix: Secondary | ICD-10-CM | POA: Diagnosis present

## 2022-08-17 LAB — OB RESULTS CONSOLE GC/CHLAMYDIA
Chlamydia: NEGATIVE
Neisseria Gonorrhea: NEGATIVE

## 2022-08-19 LAB — CYTOLOGY - PAP
Comment: NEGATIVE
Diagnosis: NEGATIVE
High risk HPV: NEGATIVE

## 2022-09-29 NOTE — Therapy (Signed)
OUTPATIENT PHYSICAL THERAPY VESTIBULAR EVALUATION     Patient Name: [name] [name] MRN: [id number] DOB:Oct 09, 1990, 32 y.o., female Today's Date: 09/29/2022  END OF SESSION:   Past Medical History:  Diagnosis Date   Depression    EDS (Ehlers-Danlos syndrome)    Hyperlipidemia    Migraine    Ovarian cyst    POTS (postural orthostatic tachycardia syndrome)    Transaminitis    Vitamin B12 deficiency    Vitamin D deficiency    Past Surgical History:  Procedure Laterality Date   EYE SURGERY     x2   WISDOM TOOTH EXTRACTION     Patient Active Problem List   Diagnosis Date Noted   Primary hypertension 06/07/2020   NASH (nonalcoholic steatohepatitis) 06/26/2019   Combined hyperlipidemia 06/26/2019    PCP: Mila Palmer, MD  REFERRING PROVIDER: Mickel Baas, FNP  REFERRING DIAG: [phone] (ICD-10-CM) - Chronic migraine without aura, intractable, without status migrainosus  THERAPY DIAG:  No diagnosis found.  ONSET DATE: ***  Rationale for Evaluation and Treatment: Rehabilitation  SUBJECTIVE:   SUBJECTIVE STATEMENT: *** Pt accompanied by: {accompnied:27141}  PERTINENT HISTORY: Depression, ehlers-danlos syndrome, HLD, migraine, POTS  PAIN:  Are you having pain? {OPRCPAIN:27236}  PRECAUTIONS: {Therapy precautions:24002}  WEIGHT BEARING RESTRICTIONS: No  FALLS: Has patient fallen in last 6 months? {fallsyesno:27318}  LIVING ENVIRONMENT: Lives with: {OPRC lives with:25569::"lives with their family"} Lives in: {Lives in:25570} Stairs: {opstairs:27293} Has following equipment at home: {Assistive devices:23999}  PLOF: {PLOF:24004}  PATIENT GOALS: ***  OBJECTIVE:   DIAGNOSTIC FINDINGS: none recent  COGNITION: Overall cognitive status: {cognition:24006}   SENSATION: {sensation:27233}   POSTURE:  {posture:25561}  PALPATION:   Cervical ROM:    Active A/PROM (deg) eval  Flexion   Extension   Right lateral flexion   Left lateral flexion    Right rotation   Left rotation   (Blank rows = not tested)  STRENGTH: ***  LOWER EXTREMITY MMT:   MMT Right eval Left eval  Hip flexion    Hip abduction    Hip adduction    Hip internal rotation    Hip external rotation    Knee flexion    Knee extension    Ankle dorsiflexion    Ankle plantarflexion    Ankle inversion    Ankle eversion    (Blank rows = not tested)  GAIT: Gait pattern: {gait characteristics:25376} Distance walked: *** Assistive device utilized: {Assistive devices:23999} Level of assistance: {Levels of assistance:24026} Comments: ***  FUNCTIONAL TESTS:  {Functional tests:24029} DGI *** FGA ***  PATIENT SURVEYS:  FOTO ***  VESTIBULAR ASSESSMENT:  GENERAL OBSERVATION: ***  OCULOMOTOR EXAM:  Ocular Alignment: {Ocular Alignment:25262}  Ocular ROM: {RANGE OF MOTION:21649}  Spontaneous Nystagmus: {Spontaneous nystagmus:25263}  Gaze-Induced Nystagmus: {gaze-induced nystagmus:25264}  Smooth Pursuits: {smooth pursuit:25265}  Saccades: {saccades:25266}  Convergence/Divergence: *** cm   VESTIBULAR - OCULAR REFLEX:   Slow VOR: {slow VOR:25290}  VOR Cancellation: {vor cancellation:25291}  Head-Impulse Test: {head impulse test:25272}  Dynamic Visual Acuity: {dynamic visual acuity:25273}    POSITIONAL TESTING:  Right Roll Test: ***; Duration: *** Left Roll Test: ***; Duration: *** Right Dix-Hallpike: ***; Duration:*** Left Dix-Hallpike: ***; Duration: *** Right Sidelying: ***; Duration: *** Left Sidelying: ***; Duration: *** Deep Head Hang: ***; Duration: *** Other: ***   MOTION SENSITIVITY:  Motion Sensitivity Quotient Intensity: 0 = none, 1 = Lightheaded, 2 = Mild, 3 = Moderate, 4 = Severe, 5 = Vomiting  Intensity  1. Sitting to supine   2. Supine to L side   3.  Supine to R side   4. Supine to sitting   5. L Hallpike-Dix   6. Up from L    7. R Hallpike-Dix   8. Up from R    9. Sitting, head tipped to L knee   10. Head up from L knee    11. Sitting, head tipped to R knee   12. Head up from R knee   13. Sitting head turns x5   14.Sitting head nods x5   15. In stance, 180 turn to L    16. In stance, 180 turn to R     OTHOSTATICS: {Exam; orthostatics:31331}  FUNCTIONAL GAIT: {Functional tests:24029}   VESTIBULAR TREATMENT:                                                                                                   DATE: ***  Canalith Repositioning:  {Canalith Repositioning:25283} Gaze Adaptation:  {gaze adaptation:25286} Habituation:  {habituation:25288} Other: ***  PATIENT EDUCATION: Education details: *** Person educated: {Person educated:25204} Education method: {Education Method:25205} Education comprehension: {Education Comprehension:25206}  HOME EXERCISE PROGRAM:  GOALS: Goals reviewed with patient? {yes/no:20286}  SHORT TERM GOALS: Target date: {follow up:25551}  Patient to be independent with initial HEP. Baseline: HEP initiated Goal status: {GOALSTATUS:25110}    LONG TERM GOALS: Target date: {follow up:25551}  Patient to be independent with advanced HEP. Baseline: Not yet initiated  Goal status: {GOALSTATUS:25110}  Patient to report 0/10 dizziness with standing vertical and horizontal VOR for 30 seconds. Baseline: Unable Goal status: {GOALSTATUS:25110}  Patient will report 0/10 dizziness with bed mobility.  Baseline: Symptomatic  Goal status: {GOALSTATUS:25110}  Patient to demonstrate *** sway with M-CTSIB condition with eyes closed/foam surface in order to improve safety in environments with uneven surfaces and dim lighting. Baseline: *** Goal status: {GOALSTATUS:25110}  Patient to score at least 20/24 on DGI in order to decrease risk of falls. Baseline: *** Goal status: {GOALSTATUS:25110}  Patient will ambulate over outdoor surfaces with LRAD while performing head turns to scan environment with good stability in order to indicate safe community mobility. Baseline:  Unable Goal status: {GOALSTATUS:25110}  Patient to score at least *** on FOTO in order to indicate improved functional outcomes.  Baseline: *** Goal status: {GOALSTATUS:25110}   ASSESSMENT:  CLINICAL IMPRESSION:   Patient is a 32 y/o F presenting to OPPT with c/o dizziness for the past ***.   Denies head trauma, infection/illness, vision changes/double vision, hearing loss, tinnitus, otalgia, photo/phonophobia.  Oculomotor exam revealed ***.  Positional testing was ***.   Patient was educated on gentle *** HEP and reported understanding. Would benefit from skilled PT services ***x/week for *** weeks to address aforementioned impairments in order to optimize level of function.     OBJECTIVE IMPAIRMENTS: {opptimpairments:25111}.   ACTIVITY LIMITATIONS: {activitylimitations:27494}  PARTICIPATION LIMITATIONS: {participationrestrictions:25113}  PERSONAL FACTORS: {Personal factors:25162} are also affecting patient's functional outcome.   REHAB POTENTIAL: {rehabpotential:25112}  CLINICAL DECISION MAKING: {clinical decision making:25114}  EVALUATION COMPLEXITY: {Evaluation complexity:25115}   PLAN:  PT FREQUENCY: {rehab frequency:25116}  PT DURATION: {rehab duration:25117}  PLANNED INTERVENTIONS: {rehab planned interventions:25118::"Therapeutic exercises","Therapeutic activity","Neuromuscular re-education","Balance training","Gait  training","Patient/Family education","Self Care","Joint mobilization"}  PLAN FOR NEXT SESSION: ***   Tyrone Sage, PT 09/29/2022, 5:13 PM

## 2022-09-30 ENCOUNTER — Ambulatory Visit: Payer: Medicaid Other | Attending: Registered Nurse | Admitting: Physical Therapy

## 2022-09-30 ENCOUNTER — Other Ambulatory Visit: Payer: Self-pay

## 2022-09-30 DIAGNOSIS — G8929 Other chronic pain: Secondary | ICD-10-CM | POA: Diagnosis present

## 2022-09-30 DIAGNOSIS — R519 Headache, unspecified: Secondary | ICD-10-CM | POA: Diagnosis present

## 2022-09-30 DIAGNOSIS — M542 Cervicalgia: Secondary | ICD-10-CM | POA: Insufficient documentation

## 2022-09-30 DIAGNOSIS — M357 Hypermobility syndrome: Secondary | ICD-10-CM | POA: Insufficient documentation

## 2022-10-06 NOTE — Therapy (Signed)
OUTPATIENT PHYSICAL THERAPY TREATMENT     Patient Name: [name] [name] MRN: [id number] DOB:09-21-1990, 32 y.o., female Today's Date: 10/07/2022  END OF SESSION:  PT End of Session - 10/07/22 0928     Visit Number 2    Number of Visits 7    Date for PT Re-Evaluation 11/11/22    Authorization Type [employer]    Authorization Time Period approved 9 PT visits from 09/30/2022 - 11/28/2022    Authorization - Visit Number 2    Authorization - Number of Visits 9    PT Start Time 0847    PT Stop Time 0928    PT Time Calculation (min) 41 min    Activity Tolerance Patient tolerated treatment well    Behavior During Therapy WFL for tasks assessed/performed              Past Medical History:  Diagnosis Date   Depression    EDS (Ehlers-Danlos syndrome)    Hyperlipidemia    Migraine    Ovarian cyst    POTS (postural orthostatic tachycardia syndrome)    Transaminitis    Vitamin B12 deficiency    Vitamin D deficiency    Past Surgical History:  Procedure Laterality Date   EYE SURGERY     x2   WISDOM TOOTH EXTRACTION     Patient Active Problem List   Diagnosis Date Noted   Primary hypertension 06/07/2020   NASH (nonalcoholic steatohepatitis) 06/26/2019   Combined hyperlipidemia 06/26/2019    PCP: Mila Palmer, MD  REFERRING PROVIDER: Mickel Baas, FNP  REFERRING DIAG: [phone] (ICD-10-CM) - Chronic migraine without aura, intractable, without status migrainosus  THERAPY DIAG:  Chronic nonintractable headache, unspecified headache type  Cervicalgia  Hypermobility syndrome  ONSET DATE: started getting migraines and POTs in 2009  Rationale for Evaluation and Treatment: Rehabilitation  SUBJECTIVE:   SUBJECTIVE STATEMENT: Reports that she did her HEP 3x since last session. AC broke at home and "migraines have been awful with the heat." Reports that she reached out to her OBGYN who reported that it would be okay for her to get DN while pregnant.     Pt accompanied by: self  PERTINENT HISTORY: Depression, ehlers-danlos syndrome, HLD, migraine, POTS  PAIN:  Are you having pain? Yes: NPRS scale: 2/10 Pain location: back of the head Pain description: headache Aggravating factors: heat, ice, massage pillow  Relieving factors: tylenol, benadryl, Rizatriptan  PRECAUTIONS: patient is [redacted] weeks pregnant (pt reports a lowlying placenta and was told not to do any strenuous exercise), ehlers-danlos syndrome, POTS  WEIGHT BEARING RESTRICTIONS: No  FALLS: Has patient fallen in last 6 months? Yes. Number of falls 2 syncopal episode and another fall down stairs  LIVING ENVIRONMENT: Lives with: lives with their spouse and lives with their daughter Lives in: House/apartment Stairs:  2 steps to enter; stairs to basement which is steep with handrail   PLOF: Independent; stay at home mom  PATIENT GOALS: improve migraine  OBJECTIVE:        TODAY'S TREATMENT: 10/07/22 Activity Comments  STM and TPR to cervical musculature  Soft tissue restriction in B UT, LS, suboccipitals, scalenes; tolerated well   review of HEP: shoulder horizontal green ABD TB 10x  B shoulder ER green TB 10x cervical retraction with self-OP 10x3"  Good form and tolerance   quadruped cervical retractions 10x3" Cueing to maintain neutral spine, avoid locking out elbows   Standing at wall serratus punch 10x  Good tolerance; modified to avoid excessive stress on wrists  Below measures were taken at time of initial evaluation unless otherwise specified:   DIAGNOSTIC FINDINGS: none recent  COGNITION: Overall cognitive status: Within functional limits for tasks assessed   SENSATION: Reports hx of peripheral neuropathy in R>L LEs    POSTURE:  rounded shoulders and increased lumbar lordosis  PALPATION: soft tissue restriction in B UT and LS, proximal biceps, B suboccipitals   Cervical ROM:    Active A/PROM (deg) eval  Flexion 60  Extension 77   Right lateral flexion 64  Left lateral flexion 53  Right rotation 85  Left rotation 83  (Blank rows = not tested)   Cervical MMT:    Active eval  Flexion 4+  Extension 4+  Right lateral flexion 4+  Left lateral flexion 4+  Right rotation 4+  Left rotation 4+  (Blank rows = not tested)   UPPER EXTREMITY MMT:  MMT Right eval Left eval  Shoulder flexion 4+ 4+  Shoulder extension 4+ 4+  Shoulder abduction    Shoulder adduction    Shoulder extension    Shoulder internal rotation 4+ 4+  Shoulder external rotation 4+ 4+  Middle trapezius    Lower trapezius    Elbow flexion 4+ 4+  Elbow extension 4+ 4+  Wrist flexion 4 4- *pain  Wrist extension 4 *pain 4- *pain  Wrist ulnar deviation    Wrist radial deviation    Wrist pronation    Wrist supination    Grip strength     (Blank rows = not tested)    GAIT: Gait pattern: WFL Assistive device utilized: None Level of assistance: Complete Independence   PATIENT SURVEYS:  NDI: 32/50   TREATMENT:                                                                                                   DATE: 09/30/22    PATIENT EDUCATION: Education details: prognosis, POC, HEP, edu on pregnancy as a contraindication for DN and educated on other possible treatments- encouraged patient to speak to her OBGYN for more input on safety for pregnancy, edu on avoiding max ROM with exercises d/t EDS Person educated: Patient Education method: Explanation, Demonstration, Tactile cues, Verbal cues, and Handouts Education comprehension: verbalized understanding and returned demonstration  HOME EXERCISE PROGRAM: Access Code: [email or web address] URL: [email or web address] Date: 09/30/2022 Prepared by: [facility] - Outpatient  Rehab - [facility]  Exercises - Seated Shoulder Horizontal Abduction with Resistance  - 1 x daily - 5 x weekly - 2 sets - 10 reps - Standing Shoulder External Rotation with Resistance  - 1 x daily - 5 x  weekly - 2 sets - 10 reps - Cervical Retraction with Overpressure  - 1 x daily - 5 x weekly - 2 sets - 10 reps - 3 sec hold  GOALS: Goals reviewed with patient? Yes  SHORT TERM GOALS: Target date: 10/21/2022  Patient to be independent with initial HEP. Baseline: HEP initiated Goal status: MET 10/07/22    LONG TERM GOALS: Target date: 11/11/2022  Patient to be independent with advanced HEP. Baseline: Not yet initiated  Goal status:  IN PROGRESS  Patient to report 70% improvement in neck pain.  Baseline: Unable Goal status: IN PROGRESS  Patient to score 24/50 on NDI in order to demonstrate moderate disability from neck pain. Baseline: 32/50- severe disability  Goal status: IN PROGRESS  Patient to demonstrate 50% improvement in palpable soft tissue restriction  Baseline: - Goal status: IN PROGRESS  Patient to report 2 migraines/month on average.  Baseline: 3 migraines/month Goal status: IN PROGRESS    ASSESSMENT:  CLINICAL IMPRESSION:  Patient arrived to session with report of increased migraines since having the [facility] go out at her home. Reports that she spoke to her OBGYN about DN, who supported proceeding with DN while pregnant. Patient tolerated MT to cervical musculature with R>L sided tightness and TTP. Reviewed HEP and provided increased resistance which was tolerated well. Progression of cervical/scapular stability exercises required modifications for comfort. Patient noted improved soft tissue restriction in neck at end of session.     OBJECTIVE IMPAIRMENTS: dizziness, increased muscle spasms, improper body mechanics, postural dysfunction, and pain.   ACTIVITY LIMITATIONS: carrying, lifting, bending, sleeping, bathing, dressing, reach over head, hygiene/grooming, and caring for others  PARTICIPATION LIMITATIONS: meal prep, cleaning, laundry, driving, shopping, community activity, yard work, and church  PERSONAL FACTORS: Age, Past/current experiences, Time since onset  of injury/illness/exacerbation, and 3+ comorbidities: Depression, ehlers-danlos syndrome, HLD, migraine, POTS  are also affecting patient's functional outcome.   REHAB POTENTIAL: Good  CLINICAL DECISION MAKING: Evolving/moderate complexity  EVALUATION COMPLEXITY: Moderate   PLAN:  PT FREQUENCY: 1x/week  PT DURATION: 6 weeks  PLANNED INTERVENTIONS: Therapeutic exercises, Therapeutic activity, Neuromuscular re-education, Balance training, Gait training, Patient/Family education, Self Care, Joint mobilization, Stair training, Vestibular training, Canalith repositioning, Aquatic Therapy, Dry Needling, Electrical stimulation, Cryotherapy, Moist heat, Taping, Manual therapy, and Re-evaluation  PLAN FOR NEXT SESSION: STM to cervical musculature; progress gentle postural strengthening and stabilization exercises   Anette Guarneri, PT, DPT 10/07/22 9:29 AM  [facility] at [facility] [address] [place], [place] [address] Phone # [phone] Fax # [phone]

## 2022-10-07 ENCOUNTER — Ambulatory Visit: Payer: Medicaid Other | Attending: Registered Nurse | Admitting: Physical Therapy

## 2022-10-07 ENCOUNTER — Ambulatory Visit: Payer: Medicaid Other | Admitting: Physical Therapy

## 2022-10-07 ENCOUNTER — Encounter: Payer: Self-pay | Admitting: Physical Therapy

## 2022-10-07 DIAGNOSIS — R519 Headache, unspecified: Secondary | ICD-10-CM | POA: Diagnosis present

## 2022-10-07 DIAGNOSIS — M542 Cervicalgia: Secondary | ICD-10-CM | POA: Insufficient documentation

## 2022-10-07 DIAGNOSIS — M357 Hypermobility syndrome: Secondary | ICD-10-CM | POA: Diagnosis present

## 2022-10-07 DIAGNOSIS — G8929 Other chronic pain: Secondary | ICD-10-CM | POA: Diagnosis present

## 2022-10-13 NOTE — Therapy (Signed)
OUTPATIENT PHYSICAL THERAPY TREATMENT     Patient Name: [name] [name] MRN: [id number] DOB:1990/08/08, 32 y.o., female Today's Date: 10/13/2022  END OF SESSION:     Past Medical History:  Diagnosis Date   Depression    EDS (Ehlers-Danlos syndrome)    Hyperlipidemia    Migraine    Ovarian cyst    POTS (postural orthostatic tachycardia syndrome)    Transaminitis    Vitamin B12 deficiency    Vitamin D deficiency    Past Surgical History:  Procedure Laterality Date   EYE SURGERY     x2   WISDOM TOOTH EXTRACTION     Patient Active Problem List   Diagnosis Date Noted   Primary hypertension 06/07/2020   NASH (nonalcoholic steatohepatitis) 06/26/2019   Combined hyperlipidemia 06/26/2019    PCP: Mila Palmer, MD  REFERRING PROVIDER: Mickel Baas, FNP  REFERRING DIAG: [phone] (ICD-10-CM) - Chronic migraine without aura, intractable, without status migrainosus  THERAPY DIAG:  No diagnosis found.  ONSET DATE: started getting migraines and POTs in 2009  Rationale for Evaluation and Treatment: Rehabilitation  SUBJECTIVE:   SUBJECTIVE STATEMENT: Reports that she did her HEP 3x since last session. AC broke at home and "migraines have been awful with the heat." Reports that she reached out to her OBGYN who reported that it would be okay for her to get DN while pregnant.    Pt accompanied by: self  PERTINENT HISTORY: Depression, ehlers-danlos syndrome, HLD, migraine, POTS  PAIN:  Are you having pain? Yes: NPRS scale: 2/10 Pain location: back of the head Pain description: headache Aggravating factors: heat, ice, massage pillow  Relieving factors: tylenol, benadryl, Rizatriptan  PRECAUTIONS: patient is [redacted] weeks pregnant (pt reports a lowlying placenta and was told not to do any strenuous exercise), ehlers-danlos syndrome, POTS  WEIGHT BEARING RESTRICTIONS: No  FALLS: Has patient fallen in last 6 months? Yes. Number of falls 2 syncopal episode and another fall  down stairs  LIVING ENVIRONMENT: Lives with: lives with their spouse and lives with their daughter Lives in: House/apartment Stairs:  2 steps to enter; stairs to basement which is steep with handrail   PLOF: Independent; stay at home mom  PATIENT GOALS: improve migraine  OBJECTIVE:      TODAY'S TREATMENT: 10/14/22 Activity Comments                        Below measures were taken at time of initial evaluation unless otherwise specified:   DIAGNOSTIC FINDINGS: none recent  COGNITION: Overall cognitive status: Within functional limits for tasks assessed   SENSATION: Reports hx of peripheral neuropathy in R>L LEs    POSTURE:  rounded shoulders and increased lumbar lordosis  PALPATION: soft tissue restriction in B UT and LS, proximal biceps, B suboccipitals   Cervical ROM:    Active A/PROM (deg) eval  Flexion 60  Extension 77  Right lateral flexion 64  Left lateral flexion 53  Right rotation 85  Left rotation 83  (Blank rows = not tested)   Cervical MMT:    Active eval  Flexion 4+  Extension 4+  Right lateral flexion 4+  Left lateral flexion 4+  Right rotation 4+  Left rotation 4+  (Blank rows = not tested)   UPPER EXTREMITY MMT:  MMT Right eval Left eval  Shoulder flexion 4+ 4+  Shoulder extension 4+ 4+  Shoulder abduction    Shoulder adduction    Shoulder extension    Shoulder internal rotation 4+ 4+  Shoulder external rotation 4+ 4+  Middle trapezius    Lower trapezius    Elbow flexion 4+ 4+  Elbow extension 4+ 4+  Wrist flexion 4 4- *pain  Wrist extension 4 *pain 4- *pain  Wrist ulnar deviation    Wrist radial deviation    Wrist pronation    Wrist supination    Grip strength     (Blank rows = not tested)    GAIT: Gait pattern: WFL Assistive device utilized: None Level of assistance: Complete Independence   PATIENT SURVEYS:  NDI: 32/50   TREATMENT:                                                                                                    DATE: 09/30/22    PATIENT EDUCATION: Education details: prognosis, POC, HEP, edu on pregnancy as a contraindication for DN and educated on other possible treatments- encouraged patient to speak to her OBGYN for more input on safety for pregnancy, edu on avoiding max ROM with exercises d/t EDS Person educated: Patient Education method: Explanation, Demonstration, Tactile cues, Verbal cues, and Handouts Education comprehension: verbalized understanding and returned demonstration  HOME EXERCISE PROGRAM: Access Code: [email or web address] URL: [email or web address] Date: 09/30/2022 Prepared by: [facility] - Outpatient  Rehab - [facility]  Exercises - Seated Shoulder Horizontal Abduction with Resistance  - 1 x daily - 5 x weekly - 2 sets - 10 reps - Standing Shoulder External Rotation with Resistance  - 1 x daily - 5 x weekly - 2 sets - 10 reps - Cervical Retraction with Overpressure  - 1 x daily - 5 x weekly - 2 sets - 10 reps - 3 sec hold  GOALS: Goals reviewed with patient? Yes  SHORT TERM GOALS: Target date: 10/21/2022  Patient to be independent with initial HEP. Baseline: HEP initiated Goal status: MET 10/07/22    LONG TERM GOALS: Target date: 11/11/2022  Patient to be independent with advanced HEP. Baseline: Not yet initiated  Goal status: IN PROGRESS  Patient to report 70% improvement in neck pain.  Baseline: Unable Goal status: IN PROGRESS  Patient to score 24/50 on NDI in order to demonstrate moderate disability from neck pain. Baseline: 32/50- severe disability  Goal status: IN PROGRESS  Patient to demonstrate 50% improvement in palpable soft tissue restriction  Baseline: - Goal status: IN PROGRESS  Patient to report 2 migraines/month on average.  Baseline: 3 migraines/month Goal status: IN PROGRESS    ASSESSMENT:  CLINICAL IMPRESSION:  Patient arrived to session with report of increased migraines since having the [facility] go  out at her home. Reports that she spoke to her OBGYN about DN, who supported proceeding with DN while pregnant. Patient tolerated MT to cervical musculature with R>L sided tightness and TTP. Reviewed HEP and provided increased resistance which was tolerated well. Progression of cervical/scapular stability exercises required modifications for comfort. Patient noted improved soft tissue restriction in neck at end of session.     OBJECTIVE IMPAIRMENTS: dizziness, increased muscle spasms, improper body mechanics, postural dysfunction, and pain.   ACTIVITY LIMITATIONS: carrying, lifting, bending, sleeping, bathing,  dressing, reach over head, hygiene/grooming, and caring for others  PARTICIPATION LIMITATIONS: meal prep, cleaning, laundry, driving, shopping, community activity, yard work, and church  PERSONAL FACTORS: Age, Past/current experiences, Time since onset of injury/illness/exacerbation, and 3+ comorbidities: Depression, ehlers-danlos syndrome, HLD, migraine, POTS  are also affecting patient's functional outcome.   REHAB POTENTIAL: Good  CLINICAL DECISION MAKING: Evolving/moderate complexity  EVALUATION COMPLEXITY: Moderate   PLAN:  PT FREQUENCY: 1x/week  PT DURATION: 6 weeks  PLANNED INTERVENTIONS: Therapeutic exercises, Therapeutic activity, Neuromuscular re-education, Balance training, Gait training, Patient/Family education, Self Care, Joint mobilization, Stair training, Vestibular training, Canalith repositioning, Aquatic Therapy, Dry Needling, Electrical stimulation, Cryotherapy, Moist heat, Taping, Manual therapy, and Re-evaluation  PLAN FOR NEXT SESSION: STM to cervical musculature; progress gentle postural strengthening and stabilization exercises   Anette Guarneri, PT, DPT 10/13/22 10:05 AM  [facility] at [facility] [address], [address] [place], [place] [address] Phone # [phone] Fax # [phone]

## 2022-10-14 ENCOUNTER — Encounter: Payer: Self-pay | Admitting: Physical Therapy

## 2022-10-14 ENCOUNTER — Ambulatory Visit: Payer: Medicaid Other | Admitting: Physical Therapy

## 2022-10-14 DIAGNOSIS — R519 Headache, unspecified: Secondary | ICD-10-CM | POA: Diagnosis not present

## 2022-10-14 DIAGNOSIS — M357 Hypermobility syndrome: Secondary | ICD-10-CM

## 2022-10-14 DIAGNOSIS — M542 Cervicalgia: Secondary | ICD-10-CM

## 2022-10-20 NOTE — Therapy (Signed)
OUTPATIENT PHYSICAL THERAPY TREATMENT     Patient Name: [name] [name] MRN: [id number] DOB:15-Oct-1990, 32 y.o., female Today's Date: 10/22/2022  END OF SESSION:  PT End of Session - 10/22/22 0923     Visit Number 4    Number of Visits 7    Date for PT Re-Evaluation 11/11/22    Authorization Type [employer]    Authorization Time Period approved 9 PT visits from 09/30/2022 - 11/28/2022    Authorization - Visit Number 4    Authorization - Number of Visits 9    PT Start Time 0850    PT Stop Time 0929    PT Time Calculation (min) 39 min    Activity Tolerance Patient tolerated treatment well    Behavior During Therapy WFL for tasks assessed/performed                Past Medical History:  Diagnosis Date   Depression    EDS (Ehlers-Danlos syndrome)    Hyperlipidemia    Migraine    Ovarian cyst    POTS (postural orthostatic tachycardia syndrome)    Transaminitis    Vitamin B12 deficiency    Vitamin D deficiency    Past Surgical History:  Procedure Laterality Date   EYE SURGERY     x2   WISDOM TOOTH EXTRACTION     Patient Active Problem List   Diagnosis Date Noted   Primary hypertension 06/07/2020   NASH (nonalcoholic steatohepatitis) 06/26/2019   Combined hyperlipidemia 06/26/2019    PCP: Mila Palmer, MD  REFERRING PROVIDER: Mickel Baas, FNP  REFERRING DIAG: [phone] (ICD-10-CM) - Chronic migraine without aura, intractable, without status migrainosus  THERAPY DIAG:  Chronic nonintractable headache, unspecified headache type  Cervicalgia  Hypermobility syndrome  ONSET DATE: started getting migraines and POTs in 2009  Rationale for Evaluation and Treatment: Rehabilitation  SUBJECTIVE:   SUBJECTIVE STATEMENT: Have had a migraine for 2 weeks and getting nerve block tomorrow. Would like DN today.    Pt accompanied by: self  PERTINENT HISTORY: Depression, ehlers-danlos syndrome, HLD, migraine, POTS  PAIN:  Are you having pain?  Yes: NPRS scale: 5/10 Pain location: R posterior neck to R side of forehead/face Pain description: headache Aggravating factors: heat, ice, massage pillow  Relieving factors: tylenol, benadryl, Rizatriptan  PRECAUTIONS: patient is [redacted] weeks pregnant (pt reports a lowlying placenta and was told not to do any strenuous exercise), ehlers-danlos syndrome, POTS  WEIGHT BEARING RESTRICTIONS: No  FALLS: Has patient fallen in last 6 months? Yes. Number of falls 2 syncopal episode and another fall down stairs  LIVING ENVIRONMENT: Lives with: lives with their spouse and lives with their daughter Lives in: House/apartment Stairs:  2 steps to enter; stairs to basement which is steep with handrail   PLOF: Independent; stay at home mom  PATIENT GOALS: improve migraine  OBJECTIVE:    TODAY'S TREATMENT: 10/22/22 Activity Comments  Skilled palpation and monitoring of tissues during TPDN  Significant Soft tissue restriction in R splenius cervicis, and cervical multifidi   Prone cervical retraction 10x Cueing to maintain chin tucked  prone I, T, Y's 10x each , then with 2# 5x each  Limited ROM in flexion postion; unable to perform with 3# d/t weakness   quaduped thread the needle with neck rotation 2x5 each side  Tolerated well; cueing for alignment            Trigger Point Dry-Needling  Treatment instructions: Expect mild to moderate muscle soreness. S/S of pneumothorax if dry needled over  a lung field, and to seek immediate medical attention should they occur. Patient verbalized understanding of these instructions and education.  Patient Consent Given: Yes Education handout provided: Yes Muscles treated: R suboccipitals, R splenius cervicis, and cervical multifidi using standard technique  Electrical stimulation performed: No Parameters: N/A Treatment response/outcome: tolerated well and without adverse effects    PATIENT EDUCATION: Education details: edu on DN benefits, expectations,  possible adverse effects, and post-DN care  Person educated: Patient Education method: Explanation, Demonstration, Tactile cues, Verbal cues, and Handouts Education comprehension: verbalized understanding and returned demonstration   HOME EXERCISE PROGRAM Last updated: 10/14/22 Access Code: [email or web address] URL: [email or web address] Date: 10/14/2022 Prepared by: [facility] - Outpatient  Rehab - [facility]  Exercises - Seated Shoulder Horizontal Abduction with Resistance  - 1 x daily - 5 x weekly - 2 sets - 10 reps - Standing Shoulder External Rotation with Resistance  - 1 x daily - 5 x weekly - 2 sets - 10 reps - Cervical Retraction with Overpressure  - 1 x daily - 5 x weekly - 2 sets - 10 reps - 3 sec hold - Thoracic Extension Mobilization on Foam Roll  - 1 x daily - 5 x weekly - 2 sets - 10 reps    Below measures were taken at time of initial evaluation unless otherwise specified:   DIAGNOSTIC FINDINGS: none recent  COGNITION: Overall cognitive status: Within functional limits for tasks assessed   SENSATION: Reports hx of peripheral neuropathy in R>L LEs    POSTURE:  rounded shoulders and increased lumbar lordosis  PALPATION: soft tissue restriction in B UT and LS, proximal biceps, B suboccipitals   Cervical ROM:    Active A/PROM (deg) eval  Flexion 60  Extension 77  Right lateral flexion 64  Left lateral flexion 53  Right rotation 85  Left rotation 83  (Blank rows = not tested)   Cervical MMT:    Active eval  Flexion 4+  Extension 4+  Right lateral flexion 4+  Left lateral flexion 4+  Right rotation 4+  Left rotation 4+  (Blank rows = not tested)   UPPER EXTREMITY MMT:  MMT Right eval Left eval  Shoulder flexion 4+ 4+  Shoulder extension 4+ 4+  Shoulder abduction    Shoulder adduction    Shoulder extension    Shoulder internal rotation 4+ 4+  Shoulder external rotation 4+ 4+  Middle trapezius    Lower trapezius    Elbow  flexion 4+ 4+  Elbow extension 4+ 4+  Wrist flexion 4 4- *pain  Wrist extension 4 *pain 4- *pain  Wrist ulnar deviation    Wrist radial deviation    Wrist pronation    Wrist supination    Grip strength     (Blank rows = not tested)    GAIT: Gait pattern: WFL Assistive device utilized: None Level of assistance: Complete Independence   PATIENT SURVEYS:  NDI: 32/50   TREATMENT:  DATE: 09/30/22    PATIENT EDUCATION: Education details: prognosis, POC, HEP, edu on pregnancy as a contraindication for DN and educated on other possible treatments- encouraged patient to speak to her OBGYN for more input on safety for pregnancy, edu on avoiding max ROM with exercises d/t EDS Person educated: Patient Education method: Explanation, Demonstration, Tactile cues, Verbal cues, and Handouts Education comprehension: verbalized understanding and returned demonstration  HOME EXERCISE PROGRAM: Access Code: [email or web address] URL: [email or web address] Date: 09/30/2022 Prepared by: [facility] - Outpatient  Rehab - [facility]  Exercises - Seated Shoulder Horizontal Abduction with Resistance  - 1 x daily - 5 x weekly - 2 sets - 10 reps - Standing Shoulder External Rotation with Resistance  - 1 x daily - 5 x weekly - 2 sets - 10 reps - Cervical Retraction with Overpressure  - 1 x daily - 5 x weekly - 2 sets - 10 reps - 3 sec hold  GOALS: Goals reviewed with patient? Yes  SHORT TERM GOALS: Target date: 10/21/2022  Patient to be independent with initial HEP. Baseline: HEP initiated Goal status: MET 10/07/22    LONG TERM GOALS: Target date: 11/11/2022  Patient to be independent with advanced HEP. Baseline: Not yet initiated  Goal status: IN PROGRESS  Patient to report 70% improvement in neck pain.  Baseline: Unable Goal status: IN PROGRESS  Patient to score 24/50 on NDI in order to  demonstrate moderate disability from neck pain. Baseline: 32/50- severe disability  Goal status: IN PROGRESS  Patient to demonstrate 50% improvement in palpable soft tissue restriction  Baseline: - Goal status: IN PROGRESS  Patient to report 2 migraines/month on average.  Baseline: 3 migraines/month Goal status: IN PROGRESS    ASSESSMENT:  CLINICAL IMPRESSION: Received fax from Dr. Gerald Leitz stating "Patient Kashmere Przywara is 18 weeks and 3 days pregnant by an estimated due date (EDD) of 03/19/2023. [name] [name] is currently under my care for her prenatal care. Due to pregnancy, dry needling, when performed safely and effectively, can offer much-needed relief to pregnancy women." After educating patient on dry needling benefits, precautions, and possible side effects and receiving verbal consent, patient tolerated DN to R cervical musculature. Proceeded with gentle scapular stabilization and postural strengthening. Patient noted no increased pain upon leaving.      OBJECTIVE IMPAIRMENTS: dizziness, increased muscle spasms, improper body mechanics, postural dysfunction, and pain.   ACTIVITY LIMITATIONS: carrying, lifting, bending, sleeping, bathing, dressing, reach over head, hygiene/grooming, and caring for others  PARTICIPATION LIMITATIONS: meal prep, cleaning, laundry, driving, shopping, community activity, yard work, and church  PERSONAL FACTORS: Age, Past/current experiences, Time since onset of injury/illness/exacerbation, and 3+ comorbidities: Depression, ehlers-danlos syndrome, HLD, migraine, POTS  are also affecting patient's functional outcome.   REHAB POTENTIAL: Good  CLINICAL DECISION MAKING: Evolving/moderate complexity  EVALUATION COMPLEXITY: Moderate   PLAN:  PT FREQUENCY: 1x/week  PT DURATION: 6 weeks  PLANNED INTERVENTIONS: Therapeutic exercises, Therapeutic activity, Neuromuscular re-education, Balance training, Gait training, Patient/Family education, Self  Care, Joint mobilization, Stair training, Vestibular training, Canalith repositioning, Aquatic Therapy, Dry Needling, Electrical stimulation, Cryotherapy, Moist heat, Taping, Manual therapy, and Re-evaluation  PLAN FOR NEXT SESSION: STM to cervical musculature; progress gentle postural strengthening and stabilization exercises   Anette Guarneri, PT, DPT 10/22/22 9:30 AM  [facility] at [facility] [address], [address] [place], [place] [address] Phone # [phone] Fax # [phone]

## 2022-10-22 ENCOUNTER — Ambulatory Visit: Payer: Medicaid Other | Admitting: Physical Therapy

## 2022-10-22 ENCOUNTER — Encounter: Payer: Self-pay | Admitting: Physical Therapy

## 2022-10-22 DIAGNOSIS — M357 Hypermobility syndrome: Secondary | ICD-10-CM

## 2022-10-22 DIAGNOSIS — M542 Cervicalgia: Secondary | ICD-10-CM

## 2022-10-22 DIAGNOSIS — G8929 Other chronic pain: Secondary | ICD-10-CM

## 2022-10-22 DIAGNOSIS — R519 Headache, unspecified: Secondary | ICD-10-CM | POA: Diagnosis not present

## 2022-10-22 NOTE — Patient Instructions (Signed)

## 2022-10-27 NOTE — Therapy (Signed)
OUTPATIENT PHYSICAL THERAPY TREATMENT     Patient Name: [name] [name] MRN: [id number] DOB:14-May-1990, 32 y.o., female Today's Date: 10/28/2022  END OF SESSION:  PT End of Session - 10/28/22 0938     Visit Number 5    Number of Visits 7    Date for PT Re-Evaluation 11/11/22    Authorization Type [employer]    Authorization Time Period approved 9 PT visits from 09/30/2022 - 11/28/2022    Authorization - Visit Number 5    Authorization - Number of Visits 9    PT Start Time 0846    PT Stop Time 0936    PT Time Calculation (min) 50 min    Activity Tolerance Patient tolerated treatment well    Behavior During Therapy WFL for tasks assessed/performed                 Past Medical History:  Diagnosis Date   Depression    EDS (Ehlers-Danlos syndrome)    Hyperlipidemia    Migraine    Ovarian cyst    POTS (postural orthostatic tachycardia syndrome)    Transaminitis    Vitamin B12 deficiency    Vitamin D deficiency    Past Surgical History:  Procedure Laterality Date   EYE SURGERY     x2   WISDOM TOOTH EXTRACTION     Patient Active Problem List   Diagnosis Date Noted   Primary hypertension 06/07/2020   NASH (nonalcoholic steatohepatitis) 06/26/2019   Combined hyperlipidemia 06/26/2019    PCP: Mila Palmer, MD  REFERRING PROVIDER: Mickel Baas, FNP  REFERRING DIAG: [phone] (ICD-10-CM) - Chronic migraine without aura, intractable, without status migrainosus  THERAPY DIAG:  Chronic nonintractable headache, unspecified headache type  Cervicalgia  Hypermobility syndrome  ONSET DATE: started getting migraines and POTs in 2009  Rationale for Evaluation and Treatment: Rehabilitation  SUBJECTIVE:   SUBJECTIVE STATEMENT: Still wakes up every day with migraines but nerve block seemed to stop the intense pain cycle. Reports some muscle soreness from DN but feels that it helped to relax the muscles. Also got Botox around head, neck, and shoulders.     Pt accompanied by: self  PERTINENT HISTORY: Depression, ehlers-danlos syndrome, HLD, migraine, POTS  PAIN:  Are you having pain? Yes: NPRS scale: 2/10 Pain location: R posterior neck to R side of forehead/face Pain description: headache Aggravating factors: heat, ice, massage pillow  Relieving factors: tylenol, benadryl, Rizatriptan  PRECAUTIONS: patient is [redacted] weeks pregnant (pt reports a lowlying placenta and was told not to do any strenuous exercise), ehlers-danlos syndrome, POTS  WEIGHT BEARING RESTRICTIONS: No  FALLS: Has patient fallen in last 6 months? Yes. Number of falls 2 syncopal episode and another fall down stairs  LIVING ENVIRONMENT: Lives with: lives with their spouse and lives with their daughter Lives in: House/apartment Stairs:  2 steps to enter; stairs to basement which is steep with handrail   PLOF: Independent; stay at home mom  PATIENT GOALS: improve migraine  OBJECTIVE:     TODAY'S TREATMENT: 10/28/22 Activity Comments  Skilled palpation and monitoring of tissues during TPDN  Palpable tender trigger pts evident in B paraspinals, L>R  STM and manual TPR  To B parapsinals, LS, UT  thoracic extension into foam roll 10x Good tolerance   cat/cow 10x  Focus on posterior pelvic tilt rather than anterior tilt  child's pose with neck rotation   Instructed on belly breathing         Trigger Point Dry-Needling  Treatment instructions: Expect  mild to moderate muscle soreness. S/S of pneumothorax if dry needled over a lung field, and to seek immediate medical attention should they occur. Patient verbalized understanding of these instructions and education.  Patient Consent Given: Yes Education handout provided: Previously provided Muscles treated: R suboccipitals, B cervical multifidi and L splenius cervicis  Electrical stimulation performed: No Parameters: N/A Treatment response/outcome: tolerated well without adverse effects     HOME EXERCISE  PROGRAM Last updated: 10/14/22 Access Code: [email or web address] URL: [email or web address] Date: 10/28/2022 Prepared by: [facility] - Outpatient  Rehab - [facility]  Exercises - Seated Shoulder Horizontal Abduction with Resistance  - 1 x daily - 5 x weekly - 2 sets - 10 reps - Standing Shoulder External Rotation with Resistance  - 1 x daily - 5 x weekly - 2 sets - 10 reps - Cervical Retraction with Overpressure  - 1 x daily - 5 x weekly - 2 sets - 10 reps - 3 sec hold - Thoracic Extension Mobilization on Foam Roll  - 1 x daily - 5 x weekly - 2 sets - 10 reps - Supine Isometric Neck Extension  - 1 x daily - 5 x weekly - 2 sets - 10 reps - 3 sec hold - Gentle Levator Scapulae Stretch  - 1 x daily - 5 x weekly - 2 sets - 30 sec hold    PATIENT EDUCATION: Education details: edu on post-DN care and HEP update Person educated: Patient Education method: Explanation, Demonstration, Tactile cues, Verbal cues, and Handouts Education comprehension: verbalized understanding and returned demonstration     Below measures were taken at time of initial evaluation unless otherwise specified:   DIAGNOSTIC FINDINGS: none recent  COGNITION: Overall cognitive status: Within functional limits for tasks assessed   SENSATION: Reports hx of peripheral neuropathy in R>L LEs    POSTURE:  rounded shoulders and increased lumbar lordosis  PALPATION: soft tissue restriction in B UT and LS, proximal biceps, B suboccipitals   Cervical ROM:    Active A/PROM (deg) eval  Flexion 60  Extension 77  Right lateral flexion 64  Left lateral flexion 53  Right rotation 85  Left rotation 83  (Blank rows = not tested)   Cervical MMT:    Active eval  Flexion 4+  Extension 4+  Right lateral flexion 4+  Left lateral flexion 4+  Right rotation 4+  Left rotation 4+  (Blank rows = not tested)   UPPER EXTREMITY MMT:  MMT Right eval Left eval  Shoulder flexion 4+ 4+  Shoulder extension 4+  4+  Shoulder abduction    Shoulder adduction    Shoulder extension    Shoulder internal rotation 4+ 4+  Shoulder external rotation 4+ 4+  Middle trapezius    Lower trapezius    Elbow flexion 4+ 4+  Elbow extension 4+ 4+  Wrist flexion 4 4- *pain  Wrist extension 4 *pain 4- *pain  Wrist ulnar deviation    Wrist radial deviation    Wrist pronation    Wrist supination    Grip strength     (Blank rows = not tested)    GAIT: Gait pattern: WFL Assistive device utilized: None Level of assistance: Complete Independence   PATIENT SURVEYS:  NDI: 32/50   TREATMENT:  DATE: 09/30/22    PATIENT EDUCATION: Education details: prognosis, POC, HEP, edu on pregnancy as a contraindication for DN and educated on other possible treatments- encouraged patient to speak to her OBGYN for more input on safety for pregnancy, edu on avoiding max ROM with exercises d/t EDS Person educated: Patient Education method: Explanation, Demonstration, Tactile cues, Verbal cues, and Handouts Education comprehension: verbalized understanding and returned demonstration  HOME EXERCISE PROGRAM: Access Code: [email or web address] URL: [email or web address] Date: 09/30/2022 Prepared by: [facility] - Outpatient  Rehab - [facility]  Exercises - Seated Shoulder Horizontal Abduction with Resistance  - 1 x daily - 5 x weekly - 2 sets - 10 reps - Standing Shoulder External Rotation with Resistance  - 1 x daily - 5 x weekly - 2 sets - 10 reps - Cervical Retraction with Overpressure  - 1 x daily - 5 x weekly - 2 sets - 10 reps - 3 sec hold  GOALS: Goals reviewed with patient? Yes  SHORT TERM GOALS: Target date: 10/21/2022  Patient to be independent with initial HEP. Baseline: HEP initiated Goal status: MET 10/07/22    LONG TERM GOALS: Target date: 11/11/2022  Patient to be independent with advanced  HEP. Baseline: Not yet initiated  Goal status: IN PROGRESS  Patient to report 70% improvement in neck pain.  Baseline: Unable Goal status: IN PROGRESS  Patient to score 24/50 on NDI in order to demonstrate moderate disability from neck pain. Baseline: 32/50- severe disability  Goal status: IN PROGRESS  Patient to demonstrate 50% improvement in palpable soft tissue restriction  Baseline: - Goal status: IN PROGRESS  Patient to report 2 migraines/month on average.  Baseline: 3 migraines/month Goal status: IN PROGRESS    ASSESSMENT:  CLINICAL IMPRESSION: Patient arrived to session with report of improvement in pain levels since receiving nerve block and Botox to cervical musculature since last session. Also reports benefit from DN last session. After receiving verbal consent, patient tolerated DN to cervical musculature. Followed with deep tissue STM to address remaining tightness on L>R side. Gentle mobility exercises followed, with good form and tolerance. Patient reported understanding of HEP update and without complaints upon leaving.      OBJECTIVE IMPAIRMENTS: dizziness, increased muscle spasms, improper body mechanics, postural dysfunction, and pain.   ACTIVITY LIMITATIONS: carrying, lifting, bending, sleeping, bathing, dressing, reach over head, hygiene/grooming, and caring for others  PARTICIPATION LIMITATIONS: meal prep, cleaning, laundry, driving, shopping, community activity, yard work, and church  PERSONAL FACTORS: Age, Past/current experiences, Time since onset of injury/illness/exacerbation, and 3+ comorbidities: Depression, ehlers-danlos syndrome, HLD, migraine, POTS  are also affecting patient's functional outcome.   REHAB POTENTIAL: Good  CLINICAL DECISION MAKING: Evolving/moderate complexity  EVALUATION COMPLEXITY: Moderate   PLAN:  PT FREQUENCY: 1x/week  PT DURATION: 6 weeks  PLANNED INTERVENTIONS: Therapeutic exercises, Therapeutic activity,  Neuromuscular re-education, Balance training, Gait training, Patient/Family education, Self Care, Joint mobilization, Stair training, Vestibular training, Canalith repositioning, Aquatic Therapy, Dry Needling, Electrical stimulation, Cryotherapy, Moist heat, Taping, Manual therapy, and Re-evaluation  PLAN FOR NEXT SESSION: STM to cervical musculature; progress gentle postural strengthening and stabilization exercises   Anette Guarneri, PT, DPT 10/28/22 9:39 AM  [facility] at [facility] [address] [place], [place] [address] Phone # [phone] Fax # [phone]

## 2022-10-28 ENCOUNTER — Encounter (HOSPITAL_COMMUNITY): Payer: Self-pay | Admitting: Obstetrics and Gynecology

## 2022-10-28 ENCOUNTER — Encounter: Payer: Self-pay | Admitting: Physical Therapy

## 2022-10-28 ENCOUNTER — Inpatient Hospital Stay (HOSPITAL_COMMUNITY)
Admission: AD | Admit: 2022-10-28 | Discharge: 2022-10-28 | Disposition: A | Discharge status: Home / Self Care | Payer: Medicaid Other | Attending: Obstetrics and Gynecology | Admitting: Obstetrics and Gynecology

## 2022-10-28 ENCOUNTER — Ambulatory Visit: Payer: Medicaid Other | Admitting: Physical Therapy

## 2022-10-28 DIAGNOSIS — O26892 Other specified pregnancy related conditions, second trimester: Secondary | ICD-10-CM | POA: Insufficient documentation

## 2022-10-28 DIAGNOSIS — R519 Headache, unspecified: Secondary | ICD-10-CM | POA: Insufficient documentation

## 2022-10-28 DIAGNOSIS — Z3A19 19 weeks gestation of pregnancy: Secondary | ICD-10-CM | POA: Diagnosis not present

## 2022-10-28 DIAGNOSIS — Z79899 Other long term (current) drug therapy: Secondary | ICD-10-CM | POA: Diagnosis not present

## 2022-10-28 DIAGNOSIS — M542 Cervicalgia: Secondary | ICD-10-CM

## 2022-10-28 DIAGNOSIS — Z7951 Long term (current) use of inhaled steroids: Secondary | ICD-10-CM | POA: Insufficient documentation

## 2022-10-28 DIAGNOSIS — M357 Hypermobility syndrome: Secondary | ICD-10-CM

## 2022-10-28 DIAGNOSIS — R030 Elevated blood-pressure reading, without diagnosis of hypertension: Secondary | ICD-10-CM | POA: Diagnosis not present

## 2022-10-28 HISTORY — DX: Severe pre-eclampsia, unspecified trimester: O14.10

## 2022-10-28 LAB — COMPREHENSIVE METABOLIC PANEL
ALT: 14 U/L (ref 0–44)
AST: 14 U/L — ABNORMAL LOW (ref 15–41)
Albumin: 3 g/dL — ABNORMAL LOW (ref 3.5–5.0)
Alkaline Phosphatase: 43 U/L (ref 38–126)
Anion gap: 9 (ref 5–15)
BUN: 7 mg/dL (ref 6–20)
CO2: 23 mmol/L (ref 22–32)
Calcium: 9.5 mg/dL (ref 8.9–10.3)
Chloride: 104 mmol/L (ref 98–111)
Creatinine, Ser: 0.59 mg/dL (ref 0.44–1.00)
GFR, Estimated: >60 mL/min (ref >60)
Glucose, Bld: 110 mg/dL — ABNORMAL HIGH (ref 70–99)
Potassium: 3.6 mmol/L (ref 3.5–5.1)
Sodium: 136 mmol/L (ref 135–145)
Total Bilirubin: 0.6 mg/dL (ref 0.3–1.2)
Total Protein: 6.2 g/dL — ABNORMAL LOW (ref 6.5–8.1)

## 2022-10-28 LAB — PROTEIN / CREATININE RATIO, URINE
Creatinine, Urine: 51 mg/dL
Protein Creatinine Ratio: 0.12 mg/mg{Cre} (ref 0.00–0.15)
Total Protein, Urine: 6 mg/dL

## 2022-10-28 LAB — CBC
HCT: 36.2 % (ref 36.0–46.0)
Hemoglobin: 12.4 g/dL (ref 12.0–15.0)
MCH: 30.2 pg (ref 26.0–34.0)
MCHC: 34.3 g/dL (ref 30.0–36.0)
MCV: 88.1 fL (ref 80.0–100.0)
Platelets: 171 10*3/uL (ref 150–400)
RBC: 4.11 MIL/uL (ref 3.87–5.11)
RDW: 13.7 % (ref 11.5–15.5)
WBC: 9.8 10*3/uL (ref 4.0–10.5)
nRBC: 0 % (ref 0.0–0.2)

## 2022-10-28 NOTE — MAU Note (Signed)
.  [name] [name] is a 33 y.o. at [redacted]w[redacted]d here in MAU reporting: last night she felt like her hands and face where very swollen took her blood pressure and it was 147/93. Has had a migraine for several weeks had botox injection Friday with a nerve block  has helped some. Pt also has Hx of POTS. Had pre-e with last pregnancy and is worried she may be developing it again. Called OB and they told her to come in. B/P was better this morning. Has been a little nauseated yesterday and today.  Onset of complaint: last night Pain score: 3 Vitals:   10/28/22 1236  BP: 129/79  Pulse: 90  Resp: 18  Temp: 98.2 F (36.8 C)     FHT:151 Lab orders placed from triage:

## 2022-10-28 NOTE — MAU Provider Note (Signed)
Chief Complaint: Headache and Hypertension   Event Date/Time   First Provider Initiated Contact with Patient 10/28/22 1404      SUBJECTIVE HPI: [name] [name] is a 32 y.o. [id number] at [redacted]w[redacted]d who presents to maternity admissions reporting headache and swelling last night and just overall not feeling well with a home  BP of 140s/90s. Her BP was normal at home this morning, and her symptoms are improved, but given her hx of severe preeclamspsia with her first pregnancy, her OB office sent her in for evaluation in MAU. She is taking a BASA and propranolol daily.  She reports feeling fetal movement and denies cramping/contractions, leaking fluid, or vaginal bleeding.  HPI  Past Medical History:  Diagnosis Date   Depression    EDS (Ehlers-Danlos syndrome)    Hyperlipidemia    Migraine    Ovarian cyst    POTS (postural orthostatic tachycardia syndrome)    Preeclampsia, severe    Transaminitis    Vitamin B12 deficiency    Vitamin D deficiency    Past Surgical History:  Procedure Laterality Date   CESAREAN SECTION     EYE SURGERY     x2   WISDOM TOOTH EXTRACTION     Social History   Socioeconomic History   Marital status: Single    Spouse name: Not on file   Number of children: Not on file   Years of education: Not on file   Highest education level: Not on file  Occupational History   Not on file  Tobacco Use   Smoking status: Never   Smokeless tobacco: Never  Vaping Use   Vaping status: Never Used  Substance and Sexual Activity   Alcohol use: Yes   Drug use: Not Currently   Sexual activity: Not Currently    Birth control/protection: None  Other Topics Concern   Not on file  Social History Narrative   Not on file   Social Determinants of Health   Financial Resource Strain: Not on file  Food Insecurity: No Food Insecurity (02/02/2020)   Received from [facility] visits prior to 06/06/2022.   Hunger Vital Sign    Worried About Running Out of Food  in the Last Year: Never true    Ran Out of Food in the Last Year: Never true  Transportation Needs: Not on file  Physical Activity: Not on file  Stress: Not on file  Social Connections: Not on file  Intimate Partner Violence: Not At Risk (02/02/2020)   Received from [facility] visits prior to 06/06/2022.   Humiliation, Afraid, Rape, and Kick questionnaire    Fear of Current or Ex-Partner: No    Emotionally Abused: No    Physically Abused: No    Sexually Abused: No   No current facility-administered medications on file prior to encounter.   Current Outpatient Medications on File Prior to Encounter  Medication Sig Dispense Refill   Azelastine-Fluticasone 137-50 MCG/ACT SUSP Place 1 spray into the nose 2 (two) times a day.     Botulinum Toxin Type A (BOTOX) 200 units SOLR Inject as directed every 3 (three) months.     cetirizine (ZYRTEC) 10 MG tablet Take 10 mg by mouth daily.     fluticasone (FLONASE) 50 MCG/ACT nasal spray Place 1 spray into both nostrils daily.     folic acid (FOLVITE) 1 MG tablet Take 1 mg by mouth daily.     Prenatal Multivit-Min-Fe-FA (PRE-NATAL PO) Take by mouth.  propranolol (INDERAL) 20 MG tablet Take 1 tablet by mouth 3 (three) times daily.     sertraline (ZOLOFT) 25 MG tablet Take 50 mg by mouth daily.      zolmitriptan (ZOMIG) 5 MG nasal solution Place 1 spray into the nose daily as needed.     butalbital-acetaminophen-caffeine (FIORICET) 50-325-40 MG tablet Take 1 tablet by mouth every 4 (four) hours as needed.     Cyanocobalamin 1000 MCG/ML KIT Inject 1 mL as directed every 30 (thirty) days.     ondansetron (ZOFRAN-ODT) 8 MG disintegrating tablet Take by mouth daily.     SUMAtriptan (IMITREX) 25 MG tablet Take 100 mg by mouth daily as needed.     Allergies  Allergen Reactions   Amphetamine-Dextroamphetamine Other (See Comments)   Fenofibrate Other (See Comments)   Ritalin [Methylphenidate] Other (See Comments)   Tape Dermatitis     Tearing of skin   Wound Dressing Adhesive Dermatitis    Tearing of skin    ROS:  Review of Systems  Constitutional:  Negative for chills, fatigue and fever.  Eyes:  Negative for visual disturbance.  Respiratory:  Negative for shortness of breath.   Cardiovascular:  Positive for leg swelling. Negative for chest pain.  Gastrointestinal:  Negative for abdominal pain, nausea and vomiting.  Genitourinary:  Negative for difficulty urinating, dysuria, flank pain, pelvic pain, vaginal bleeding, vaginal discharge and vaginal pain.  Neurological:  Positive for headaches. Negative for dizziness.  Psychiatric/Behavioral: Negative.       I have reviewed patient's Past Medical Hx, Surgical Hx, Family Hx, Social Hx, medications and allergies.   Physical Exam  Patient Vitals for the past 24 hrs:  BP Temp Temp src Pulse Resp SpO2 Height Weight  10/28/22 1524 120/86 98.2 F (36.8 C) Oral 87 16 -- -- --  10/28/22 1420 125/85 -- -- 88 -- -- -- --  10/28/22 1406 125/85 -- -- 85 -- -- -- --  10/28/22 1313 128/74 -- -- 90 18 98 % -- --  10/28/22 1236 129/79 98.2 F (36.8 C) -- 90 18 -- 4\' 9"  (1.448 m) 78.9 kg   Constitutional: Well-developed, well-nourished female in no acute distress.  Cardiovascular: normal rate Respiratory: normal effort GI: Abd soft, non-tender. Pos BS x 4 MS: Extremities nontender, no edema, normal ROM Neurologic: Alert and oriented x 4.  GU: Neg CVAT.  PELVIC EXAM: Deferred  FHT 151 by doppler  LAB RESULTS Results for orders placed or performed during the hospital encounter of 10/28/22 (from the past 24 hour(s))  Protein / creatinine ratio, urine     Status: None   Collection Time: 10/28/22  1:20 PM  Result Value Ref Range   Creatinine, Urine 51 mg/dL   Total Protein, Urine 6 mg/dL   Protein Creatinine Ratio 0.12 0.00 - 0.15 mg/mg[Cre]  CBC     Status: None   Collection Time: 10/28/22  1:43 PM  Result Value Ref Range   WBC 9.8 4.0 - 10.5 K/uL   RBC 4.11 3.87  - 5.11 MIL/uL   Hemoglobin 12.4 12.0 - 15.0 g/dL   HCT 10.2 72.5 - 36.6 %   MCV 88.1 80.0 - 100.0 fL   MCH 30.2 26.0 - 34.0 pg   MCHC 34.3 30.0 - 36.0 g/dL   RDW 44.0 34.7 - 42.5 %   Platelets 171 150 - 400 K/uL   nRBC 0.0 0.0 - 0.2 %  Comprehensive metabolic panel     Status: Abnormal   Collection Time: 10/28/22  1:43  PM  Result Value Ref Range   Sodium 136 135 - 145 mmol/L   Potassium 3.6 3.5 - 5.1 mmol/L   Chloride 104 98 - 111 mmol/L   CO2 23 22 - 32 mmol/L   Glucose, Bld 110 (H) 70 - 99 mg/dL   BUN 7 6 - 20 mg/dL   Creatinine, Ser 0.98 0.44 - 1.00 mg/dL   Calcium 9.5 8.9 - 11.9 mg/dL   Total Protein 6.2 (L) 6.5 - 8.1 g/dL   Albumin 3.0 (L) 3.5 - 5.0 g/dL   AST 14 (L) 15 - 41 U/L   ALT 14 0 - 44 U/L   Alkaline Phosphatase 43 38 - 126 U/L   Total Bilirubin 0.6 0.3 - 1.2 mg/dL   GFR, Estimated >14 >78 mL/min   Anion gap 9 5 - 15       IMAGING No results found.  MAU Management/MDM: Orders Placed This Encounter  Procedures   CBC   Comprehensive metabolic panel   Protein / creatinine ratio, urine   Discharge patient    No orders of the defined types were placed in this encounter.   No acute findings. PEC labs wnl, no s/sx of PEC today.  Pt home BP cuff compared to MAU BP machine and was comparable.  Pt to continue home BPs, f/u in office as scheduled. Return precautions reviewed.    ASSESSMENT 1. Elevated blood-pressure reading without diagnosis of hypertension   2. [redacted] weeks gestation of pregnancy   3. Headache in pregnancy, antepartum, second trimester     PLAN Discharge home Allergies as of 10/28/2022       Reactions   Amphetamine-dextroamphetamine Other (See Comments)   Fenofibrate Other (See Comments)   Ritalin [methylphenidate] Other (See Comments)   Tape Dermatitis   Tearing of skin   Wound Dressing Adhesive Dermatitis   Tearing of skin        Medication List     STOP taking these medications    Aimovig 70 MG/ML Soaj Generic drug:  Erenumab-aooe   ALPRAZolam 0.5 MG tablet Commonly known as: XANAX   baclofen 10 MG tablet Commonly known as: LIORESAL   Cambia 50 MG Pack Generic drug: Diclofenac Potassium(Migraine)   Elyxyb 120 MG/4.8ML Soln Generic drug: Celecoxib   fluticasone-salmeterol 100-50 MCG/ACT Aepb Commonly known as: ADVAIR   ketorolac 60 MG/2ML Soln injection Commonly known as: TORADOL   labetalol 200 MG tablet Commonly known as: NORMODYNE   Melatonin 1 MG Caps   meloxicam 7.5 MG tablet Commonly known as: MOBIC   methylphenidate 5 MG tablet Commonly known as: RITALIN   mometasone 0.1 % cream Commonly known as: ELOCON   montelukast 4 MG Pack Commonly known as: SINGULAIR   omega-3 acid ethyl esters 1 g capsule Commonly known as: LOVAZA   ONE-A-DAY WOMENS 50 PLUS PO   PROBIOTIC ADVANCED PO   Relpax 40 MG tablet Generic drug: eletriptan   zolpidem 5 MG tablet Commonly known as: AMBIEN       TAKE these medications    Azelastine-Fluticasone 137-50 MCG/ACT Susp Place 1 spray into the nose 2 (two) times a day.   Botox 200 units injection Generic drug: botulinum toxin Type A Inject as directed every 3 (three) months.   butalbital-acetaminophen-caffeine 50-325-40 MG tablet Commonly known as: FIORICET Take 1 tablet by mouth every 4 (four) hours as needed.   cetirizine 10 MG tablet Commonly known as: ZYRTEC Take 10 mg by mouth daily.   Cyanocobalamin 1000 MCG/ML Kit Inject 1 mL  as directed every 30 (thirty) days.   fluticasone 50 MCG/ACT nasal spray Commonly known as: FLONASE Place 1 spray into both nostrils daily.   folic acid 1 MG tablet Commonly known as: FOLVITE Take 1 mg by mouth daily.   ondansetron 8 MG disintegrating tablet Commonly known as: ZOFRAN-ODT Take by mouth daily.   PRE-NATAL PO Take by mouth.   propranolol 20 MG tablet Commonly known as: INDERAL Take 1 tablet by mouth 3 (three) times daily.   sertraline 25 MG tablet Commonly known as:  ZOLOFT Take 50 mg by mouth daily.   SUMAtriptan 25 MG tablet Commonly known as: IMITREX Take 100 mg by mouth daily as needed.   Zomig 5 MG nasal solution Generic drug: zolmitriptan Place 1 spray into the nose daily as needed.        Follow-up Information     Gynecology, [facility] Follow up.   Specialty: Obstetrics and Gynecology Why: As scheduled Contact information: [address] [place] [address] [phone]         [facility] Follow up.   Specialty: Obstetrics and Gynecology Why: As needed for emergencies Contact information: [address] [place] [address] [phone]                Sharen Counter Certified Nurse-Midwife 10/28/2022  3:53 PM

## 2022-11-03 NOTE — Therapy (Signed)
OUTPATIENT PHYSICAL THERAPY TREATMENT     Patient Name: [name] [name] MRN: [id number] DOB:15-Dec-1990, 32 y.o., female Today's Date: 11/04/2022  END OF SESSION:  PT End of Session - 11/04/22 0927     Visit Number 6    Number of Visits 7    Date for PT Re-Evaluation 11/11/22    Authorization Type [employer]    Authorization Time Period approved 9 PT visits from 09/30/2022 - 11/28/2022    Authorization - Visit Number 6    Authorization - Number of Visits 9    PT Start Time [phone]   pt late   PT Stop Time 0930    PT Time Calculation (min) 37 min    Activity Tolerance Patient tolerated treatment well    Behavior During Therapy WFL for tasks assessed/performed                  Past Medical History:  Diagnosis Date   Depression    EDS (Ehlers-Danlos syndrome)    Hyperlipidemia    Migraine    Ovarian cyst    POTS (postural orthostatic tachycardia syndrome)    Preeclampsia, severe    Transaminitis    Vitamin B12 deficiency    Vitamin D deficiency    Past Surgical History:  Procedure Laterality Date   CESAREAN SECTION     EYE SURGERY     x2   WISDOM TOOTH EXTRACTION     Patient Active Problem List   Diagnosis Date Noted   Primary hypertension 06/07/2020   NASH (nonalcoholic steatohepatitis) 06/26/2019   Combined hyperlipidemia 06/26/2019    PCP: Mila Palmer, MD  REFERRING PROVIDER: Mickel Baas, FNP  REFERRING DIAG: [phone] (ICD-10-CM) - Chronic migraine without aura, intractable, without status migrainosus  THERAPY DIAG:  Chronic nonintractable headache, unspecified headache type  Cervicalgia  Hypermobility syndrome  ONSET DATE: started getting migraines and POTs in 2009  Rationale for Evaluation and Treatment: Rehabilitation  SUBJECTIVE:   SUBJECTIVE STATEMENT: Called her OBGYN about her elevated BP and they told her to go to the ED d/t potential for preeclampsia. Reports that she was told that it was just her POTS. Reports  soreness for day and a half after DN and has worked on some neck exercises each day this week.    Pt accompanied by: self  PERTINENT HISTORY: Depression, ehlers-danlos syndrome, HLD, migraine, POTS  PAIN:  Are you having pain? Yes: NPRS scale: 1/10 Pain location: R posterior neck to R side of forehead/face Pain description: headache Aggravating factors: heat, ice, massage pillow  Relieving factors: tylenol, benadryl, Rizatriptan  PRECAUTIONS: patient is [redacted] weeks pregnant (pt reports a lowlying placenta and was told not to do any strenuous exercise), ehlers-danlos syndrome, POTS  WEIGHT BEARING RESTRICTIONS: No  FALLS: Has patient fallen in last 6 months? Yes. Number of falls 2 syncopal episode and another fall down stairs  LIVING ENVIRONMENT: Lives with: lives with their spouse and lives with their daughter Lives in: House/apartment Stairs:  2 steps to enter; stairs to basement which is steep with handrail   PLOF: Independent; stay at home mom  PATIENT GOALS: improve migraine  OBJECTIVE:    TODAY'S TREATMENT: 11/04/22 Activity Comments  Vitals at start of session  125/74 mmHg, 98% spO2, 96 bpm   Skilled palpation and monitoring of tissues during TPDN   Soft tissue restriction and palpable trigger pts evident in B cervical multifidi   supine isometric neck extension 10x3 Cueing to avoid over-protraction   open book stretch 10x each  side Assist in positioning ; report of good stretch   LS stretch  30" Cervical retraction + head rotation  Cueing to depress shoulders   standing thoracic extension at wall Cueing for tight core and posterior pelvic tilt to isolate midback         Trigger Point Dry-Needling  Treatment instructions: Expect mild to moderate muscle soreness. S/S of pneumothorax if dry needled over a lung field, and to seek immediate medical attention should they occur. Patient verbalized understanding of these instructions and education.  Patient Consent Given:  Yes Education handout provided: Previously provided Muscles treated: B cervical multifidi  Electrical stimulation performed: No Parameters: N/A Treatment response/outcome: good tolerance; no adverse effects    PATIENT EDUCATION: Education details: edu on post-DN care and precautions  Person educated: Patient Education method: Explanation Education comprehension: verbalized understanding     HOME EXERCISE PROGRAM Last updated: 10/14/22 Access Code: [email or web address] URL: [email or web address] Date: 10/28/2022 Prepared by: [facility] - Outpatient  Rehab - [facility]  Exercises - Seated Shoulder Horizontal Abduction with Resistance  - 1 x daily - 5 x weekly - 2 sets - 10 reps - Standing Shoulder External Rotation with Resistance  - 1 x daily - 5 x weekly - 2 sets - 10 reps - Cervical Retraction with Overpressure  - 1 x daily - 5 x weekly - 2 sets - 10 reps - 3 sec hold - Thoracic Extension Mobilization on Foam Roll  - 1 x daily - 5 x weekly - 2 sets - 10 reps - Supine Isometric Neck Extension  - 1 x daily - 5 x weekly - 2 sets - 10 reps - 3 sec hold - Gentle Levator Scapulae Stretch  - 1 x daily - 5 x weekly - 2 sets - 30 sec hold      Below measures were taken at time of initial evaluation unless otherwise specified:   DIAGNOSTIC FINDINGS: none recent  COGNITION: Overall cognitive status: Within functional limits for tasks assessed   SENSATION: Reports hx of peripheral neuropathy in R>L LEs    POSTURE:  rounded shoulders and increased lumbar lordosis  PALPATION: soft tissue restriction in B UT and LS, proximal biceps, B suboccipitals   Cervical ROM:    Active A/PROM (deg) eval  Flexion 60  Extension 77  Right lateral flexion 64  Left lateral flexion 53  Right rotation 85  Left rotation 83  (Blank rows = not tested)   Cervical MMT:    Active eval  Flexion 4+  Extension 4+  Right lateral flexion 4+  Left lateral flexion 4+  Right rotation  4+  Left rotation 4+  (Blank rows = not tested)   UPPER EXTREMITY MMT:  MMT Right eval Left eval  Shoulder flexion 4+ 4+  Shoulder extension 4+ 4+  Shoulder abduction    Shoulder adduction    Shoulder extension    Shoulder internal rotation 4+ 4+  Shoulder external rotation 4+ 4+  Middle trapezius    Lower trapezius    Elbow flexion 4+ 4+  Elbow extension 4+ 4+  Wrist flexion 4 4- *pain  Wrist extension 4 *pain 4- *pain  Wrist ulnar deviation    Wrist radial deviation    Wrist pronation    Wrist supination    Grip strength     (Blank rows = not tested)    GAIT: Gait pattern: WFL Assistive device utilized: None Level of assistance: Complete Independence   PATIENT SURVEYS:  NDI: 32/50  TREATMENT:                                                                                                   DATE: 09/30/22    PATIENT EDUCATION: Education details: prognosis, POC, HEP, edu on pregnancy as a contraindication for DN and educated on other possible treatments- encouraged patient to speak to her OBGYN for more input on safety for pregnancy, edu on avoiding max ROM with exercises d/t EDS Person educated: Patient Education method: Explanation, Demonstration, Tactile cues, Verbal cues, and Handouts Education comprehension: verbalized understanding and returned demonstration  HOME EXERCISE PROGRAM: Access Code: [email or web address] URL: [email or web address] Date: 09/30/2022 Prepared by: [facility] - Outpatient  Rehab - [facility]  Exercises - Seated Shoulder Horizontal Abduction with Resistance  - 1 x daily - 5 x weekly - 2 sets - 10 reps - Standing Shoulder External Rotation with Resistance  - 1 x daily - 5 x weekly - 2 sets - 10 reps - Cervical Retraction with Overpressure  - 1 x daily - 5 x weekly - 2 sets - 10 reps - 3 sec hold  GOALS: Goals reviewed with patient? Yes  SHORT TERM GOALS: Target date: 10/21/2022  Patient to be independent with initial  HEP. Baseline: HEP initiated Goal status: MET 10/07/22    LONG TERM GOALS: Target date: 11/11/2022  Patient to be independent with advanced HEP. Baseline: Not yet initiated  Goal status: IN PROGRESS  Patient to report 70% improvement in neck pain.  Baseline: Unable Goal status: IN PROGRESS  Patient to score 24/50 on NDI in order to demonstrate moderate disability from neck pain. Baseline: 32/50- severe disability  Goal status: IN PROGRESS  Patient to demonstrate 50% improvement in palpable soft tissue restriction  Baseline: - Goal status: IN PROGRESS  Patient to report 2 migraines/month on average.  Baseline: 3 migraines/month Goal status: IN PROGRESS    ASSESSMENT:  CLINICAL IMPRESSION: Patient arrived to session today since ED visit on 10/28/22 for HTN. Vitals at start of session were WNL. Called her OBGYN about her elevated BP and they told her to go to the ED d/t potential for preeclampsia. Reports that she was told that it was just her POTS. Reports soreness for day and a half after DN and has worked on some neck exercises each day this week.After receiving verbal consent, patient tolerated DN to cervical musculature without adverse effects. Proceed with cervical strengthening and spinal mobility activities. Patient tolerated duration of session well and without complaints upon leaving.      OBJECTIVE IMPAIRMENTS: dizziness, increased muscle spasms, improper body mechanics, postural dysfunction, and pain.   ACTIVITY LIMITATIONS: carrying, lifting, bending, sleeping, bathing, dressing, reach over head, hygiene/grooming, and caring for others  PARTICIPATION LIMITATIONS: meal prep, cleaning, laundry, driving, shopping, community activity, yard work, and church  PERSONAL FACTORS: Age, Past/current experiences, Time since onset of injury/illness/exacerbation, and 3+ comorbidities: Depression, ehlers-danlos syndrome, HLD, migraine, POTS  are also affecting patient's functional  outcome.   REHAB POTENTIAL: Good  CLINICAL DECISION MAKING: Evolving/moderate complexity  EVALUATION COMPLEXITY: Moderate   PLAN:  PT FREQUENCY: 1x/week  PT DURATION: 6 weeks  PLANNED INTERVENTIONS: Therapeutic exercises, Therapeutic activity, Neuromuscular re-education, Balance training, Gait training, Patient/Family education, Self Care, Joint mobilization, Stair training, Vestibular training, Canalith repositioning, Aquatic Therapy, Dry Needling, Electrical stimulation, Cryotherapy, Moist heat, Taping, Manual therapy, and Re-evaluation  PLAN FOR NEXT SESSION: STM to cervical musculature; progress gentle postural strengthening and stabilization exercises   Anette Guarneri, PT, DPT 11/04/22 9:32 AM  [facility] at [facility] [address] [place], [place] [address] Phone # [phone] Fax # [phone]

## 2022-11-04 ENCOUNTER — Encounter: Payer: Self-pay | Admitting: Physical Therapy

## 2022-11-04 ENCOUNTER — Ambulatory Visit: Payer: Medicaid Other | Admitting: Physical Therapy

## 2022-11-04 DIAGNOSIS — M357 Hypermobility syndrome: Secondary | ICD-10-CM

## 2022-11-04 DIAGNOSIS — G8929 Other chronic pain: Secondary | ICD-10-CM

## 2022-11-04 DIAGNOSIS — R519 Headache, unspecified: Secondary | ICD-10-CM | POA: Diagnosis not present

## 2022-11-04 DIAGNOSIS — M542 Cervicalgia: Secondary | ICD-10-CM

## 2022-11-10 NOTE — Therapy (Signed)
OUTPATIENT PHYSICAL THERAPY TREATMENT     Patient Name: [name] [name] MRN: [id number] DOB:09-21-1990, 32 y.o., female Today's Date: 11/10/2022  END OF SESSION:         Past Medical History:  Diagnosis Date   Depression    EDS (Ehlers-Danlos syndrome)    Hyperlipidemia    Migraine    Ovarian cyst    POTS (postural orthostatic tachycardia syndrome)    Preeclampsia, severe    Transaminitis    Vitamin B12 deficiency    Vitamin D deficiency    Past Surgical History:  Procedure Laterality Date   CESAREAN SECTION     EYE SURGERY     x2   WISDOM TOOTH EXTRACTION     Patient Active Problem List   Diagnosis Date Noted   Primary hypertension 06/07/2020   NASH (nonalcoholic steatohepatitis) 06/26/2019   Combined hyperlipidemia 06/26/2019    PCP: Mila Palmer, MD  REFERRING PROVIDER: Mickel Baas, FNP  REFERRING DIAG: [phone] (ICD-10-CM) - Chronic migraine without aura, intractable, without status migrainosus  THERAPY DIAG:  No diagnosis found.  ONSET DATE: started getting migraines and POTs in 2009  Rationale for Evaluation and Treatment: Rehabilitation  SUBJECTIVE:   SUBJECTIVE STATEMENT: Called her OBGYN about her elevated BP and they told her to go to the ED d/t potential for preeclampsia. Reports that she was told that it was just her POTS. Reports soreness for day and a half after DN and has worked on some neck exercises each day this week.    Pt accompanied by: self  PERTINENT HISTORY: Depression, ehlers-danlos syndrome, HLD, migraine, POTS  PAIN:  Are you having pain? Yes: NPRS scale: 1/10 Pain location: R posterior neck to R side of forehead/face Pain description: headache Aggravating factors: heat, ice, massage pillow  Relieving factors: tylenol, benadryl, Rizatriptan  PRECAUTIONS: patient is [redacted] weeks pregnant (pt reports a lowlying placenta and was told not to do any strenuous exercise), ehlers-danlos syndrome, POTS  WEIGHT BEARING  RESTRICTIONS: No  FALLS: Has patient fallen in last 6 months? Yes. Number of falls 2 syncopal episode and another fall down stairs  LIVING ENVIRONMENT: Lives with: lives with their spouse and lives with their daughter Lives in: House/apartment Stairs:  2 steps to enter; stairs to basement which is steep with handrail   PLOF: Independent; stay at home mom  PATIENT GOALS: improve migraine  OBJECTIVE:     TODAY'S TREATMENT: 11/11/22 Activity Comments                        HOME EXERCISE PROGRAM Last updated: 10/14/22 Access Code: [email or web address] URL: [email or web address] Date: 10/28/2022 Prepared by: [facility] - Outpatient  Rehab - [facility]  Exercises - Seated Shoulder Horizontal Abduction with Resistance  - 1 x daily - 5 x weekly - 2 sets - 10 reps - Standing Shoulder External Rotation with Resistance  - 1 x daily - 5 x weekly - 2 sets - 10 reps - Cervical Retraction with Overpressure  - 1 x daily - 5 x weekly - 2 sets - 10 reps - 3 sec hold - Thoracic Extension Mobilization on Foam Roll  - 1 x daily - 5 x weekly - 2 sets - 10 reps - Supine Isometric Neck Extension  - 1 x daily - 5 x weekly - 2 sets - 10 reps - 3 sec hold - Gentle Levator Scapulae Stretch  - 1 x daily - 5 x weekly - 2 sets - 30 sec hold  Below measures were taken at time of initial evaluation unless otherwise specified:   DIAGNOSTIC FINDINGS: none recent  COGNITION: Overall cognitive status: Within functional limits for tasks assessed   SENSATION: Reports hx of peripheral neuropathy in R>L LEs    POSTURE:  rounded shoulders and increased lumbar lordosis  PALPATION: soft tissue restriction in B UT and LS, proximal biceps, B suboccipitals   Cervical ROM:    Active A/PROM (deg) eval  Flexion 60  Extension 77  Right lateral flexion 64  Left lateral flexion 53  Right rotation 85  Left rotation 83  (Blank rows = not tested)   Cervical MMT:    Active eval   Flexion 4+  Extension 4+  Right lateral flexion 4+  Left lateral flexion 4+  Right rotation 4+  Left rotation 4+  (Blank rows = not tested)   UPPER EXTREMITY MMT:  MMT Right eval Left eval  Shoulder flexion 4+ 4+  Shoulder extension 4+ 4+  Shoulder abduction    Shoulder adduction    Shoulder extension    Shoulder internal rotation 4+ 4+  Shoulder external rotation 4+ 4+  Middle trapezius    Lower trapezius    Elbow flexion 4+ 4+  Elbow extension 4+ 4+  Wrist flexion 4 4- *pain  Wrist extension 4 *pain 4- *pain  Wrist ulnar deviation    Wrist radial deviation    Wrist pronation    Wrist supination    Grip strength     (Blank rows = not tested)    GAIT: Gait pattern: WFL Assistive device utilized: None Level of assistance: Complete Independence   PATIENT SURVEYS:  NDI: 32/50   TREATMENT:                                                                                                   DATE: 09/30/22    PATIENT EDUCATION: Education details: prognosis, POC, HEP, edu on pregnancy as a contraindication for DN and educated on other possible treatments- encouraged patient to speak to her OBGYN for more input on safety for pregnancy, edu on avoiding max ROM with exercises d/t EDS Person educated: Patient Education method: Explanation, Demonstration, Tactile cues, Verbal cues, and Handouts Education comprehension: verbalized understanding and returned demonstration  HOME EXERCISE PROGRAM: Access Code: [email or web address] URL: [email or web address] Date: 09/30/2022 Prepared by: [facility] - Outpatient  Rehab - [facility]  Exercises - Seated Shoulder Horizontal Abduction with Resistance  - 1 x daily - 5 x weekly - 2 sets - 10 reps - Standing Shoulder External Rotation with Resistance  - 1 x daily - 5 x weekly - 2 sets - 10 reps - Cervical Retraction with Overpressure  - 1 x daily - 5 x weekly - 2 sets - 10 reps - 3 sec hold  GOALS: Goals reviewed with  patient? Yes  SHORT TERM GOALS: Target date: 10/21/2022  Patient to be independent with initial HEP. Baseline: HEP initiated Goal status: MET 10/07/22    LONG TERM GOALS: Target date: 11/11/2022  Patient to be independent with advanced HEP. Baseline: Not yet initiated  Goal status:  IN PROGRESS  Patient to report 70% improvement in neck pain.  Baseline: Unable Goal status: IN PROGRESS  Patient to score 24/50 on NDI in order to demonstrate moderate disability from neck pain. Baseline: 32/50- severe disability  Goal status: IN PROGRESS  Patient to demonstrate 50% improvement in palpable soft tissue restriction  Baseline: - Goal status: IN PROGRESS  Patient to report 2 migraines/month on average.  Baseline: 3 migraines/month Goal status: IN PROGRESS    ASSESSMENT:  CLINICAL IMPRESSION: Patient arrived to session today since ED visit on 10/28/22 for HTN. Vitals at start of session were WNL. Called her OBGYN about her elevated BP and they told her to go to the ED d/t potential for preeclampsia. Reports that she was told that it was just her POTS. Reports soreness for day and a half after DN and has worked on some neck exercises each day this week.After receiving verbal consent, patient tolerated DN to cervical musculature without adverse effects. Proceed with cervical strengthening and spinal mobility activities. Patient tolerated duration of session well and without complaints upon leaving.      OBJECTIVE IMPAIRMENTS: dizziness, increased muscle spasms, improper body mechanics, postural dysfunction, and pain.   ACTIVITY LIMITATIONS: carrying, lifting, bending, sleeping, bathing, dressing, reach over head, hygiene/grooming, and caring for others  PARTICIPATION LIMITATIONS: meal prep, cleaning, laundry, driving, shopping, community activity, yard work, and church  PERSONAL FACTORS: Age, Past/current experiences, Time since onset of injury/illness/exacerbation, and 3+ comorbidities:  Depression, ehlers-danlos syndrome, HLD, migraine, POTS  are also affecting patient's functional outcome.   REHAB POTENTIAL: Good  CLINICAL DECISION MAKING: Evolving/moderate complexity  EVALUATION COMPLEXITY: Moderate   PLAN:  PT FREQUENCY: 1x/week  PT DURATION: 6 weeks  PLANNED INTERVENTIONS: Therapeutic exercises, Therapeutic activity, Neuromuscular re-education, Balance training, Gait training, Patient/Family education, Self Care, Joint mobilization, Stair training, Vestibular training, Canalith repositioning, Aquatic Therapy, Dry Needling, Electrical stimulation, Cryotherapy, Moist heat, Taping, Manual therapy, and Re-evaluation  PLAN FOR NEXT SESSION: STM to cervical musculature; progress gentle postural strengthening and stabilization exercises   Anette Guarneri, PT, DPT 11/10/22 8:48 AM  [facility] at [facility] [address] [place], [place] [address] Phone # [phone] Fax # [phone]

## 2022-11-11 ENCOUNTER — Ambulatory Visit: Payer: Medicaid Other | Attending: Registered Nurse | Admitting: Physical Therapy

## 2022-11-11 ENCOUNTER — Encounter: Payer: Self-pay | Admitting: Physical Therapy

## 2022-11-11 DIAGNOSIS — M6281 Muscle weakness (generalized): Secondary | ICD-10-CM | POA: Diagnosis present

## 2022-11-11 DIAGNOSIS — G8929 Other chronic pain: Secondary | ICD-10-CM | POA: Insufficient documentation

## 2022-11-11 DIAGNOSIS — R519 Headache, unspecified: Secondary | ICD-10-CM | POA: Insufficient documentation

## 2022-11-11 DIAGNOSIS — M542 Cervicalgia: Secondary | ICD-10-CM | POA: Diagnosis present

## 2022-11-11 DIAGNOSIS — M357 Hypermobility syndrome: Secondary | ICD-10-CM | POA: Diagnosis present

## 2022-11-17 ENCOUNTER — Ambulatory Visit: Payer: Medicaid Other | Admitting: Rehabilitative and Restorative Service Providers"

## 2022-11-17 ENCOUNTER — Encounter: Payer: Self-pay | Admitting: Rehabilitative and Restorative Service Providers"

## 2022-11-17 DIAGNOSIS — M357 Hypermobility syndrome: Secondary | ICD-10-CM

## 2022-11-17 DIAGNOSIS — R519 Headache, unspecified: Secondary | ICD-10-CM

## 2022-11-17 DIAGNOSIS — M542 Cervicalgia: Secondary | ICD-10-CM

## 2022-11-17 NOTE — Therapy (Signed)
OUTPATIENT PHYSICAL THERAPY TREATMENT   Patient Name: [name] [name] MRN: [id number] DOB:[birth year], 32 y.o., female Today's Date: 11/17/2022  END OF SESSION:  PT End of Session - 11/17/22 1320     Visit Number 8    Number of Visits 11    Date for PT Re-Evaluation 12/09/22    Authorization Type [employer]    Authorization Time Period approved 9 PT visits from 09/30/2022 - 11/28/2022    Authorization - Visit Number 8    Authorization - Number of Visits 9    PT Start Time 1320    PT Stop Time 1400    PT Time Calculation (min) 40 min    Activity Tolerance Patient tolerated treatment well    Behavior During Therapy WFL for tasks assessed/performed            Past Medical History:  Diagnosis Date   Depression    EDS (Ehlers-Danlos syndrome)    Hyperlipidemia    Migraine    Ovarian cyst    POTS (postural orthostatic tachycardia syndrome)    Preeclampsia, severe    Transaminitis    Vitamin B12 deficiency    Vitamin D deficiency    Past Surgical History:  Procedure Laterality Date   CESAREAN SECTION     EYE SURGERY     x2   WISDOM TOOTH EXTRACTION     Patient Active Problem List   Diagnosis Date Noted   Primary hypertension 06/07/2020   NASH (nonalcoholic steatohepatitis) 06/26/2019   Combined hyperlipidemia 06/26/2019    PCP: Mila Palmer, MD  REFERRING PROVIDER: Mickel Baas, FNP  REFERRING DIAG: [phone] (ICD-10-CM) - Chronic migraine without aura, intractable, without status migrainosus  THERAPY DIAG:  Chronic nonintractable headache, unspecified headache type  Cervicalgia  Hypermobility syndrome  ONSET DATE: started getting migraines and POTs in 2009  Rationale for Evaluation and Treatment: Rehabilitation  SUBJECTIVE:   SUBJECTIVE STATEMENT: The patient reports her low back is somewhat improved. She does notice if she stretches too much, she can feel it in her back. She is doing HEP, especially the chin retraction and horizontal  abduction. She had a migraine on Friday/Saturday. She gets tightness in the back of her neck with her migraine.  Pt accompanied by: self  PERTINENT HISTORY: Depression, ehlers-danlos syndrome, HLD, migraine, POTS  PAIN:  Are you having pain? Yes: NPRS scale: "tightness"/10 Pain location: neck -- central Pain description: headache and neck tightness Aggravating factors: heat, ice, massage pillow  Relieving factors: tylenol, benadryl, Rizatriptan  Are you having pain? Yes: NPRS scale: 1/10 Pain location: R midback Pain description: sore Aggravating factors: lifting, bending, prolonged standing  Relieving factors: heat, ice, massage pillow  PRECAUTIONS: patient is [redacted] weeks pregnant (pt reports a lowlying placenta and was told not to do any strenuous exercise), ehlers-danlos syndrome, POTS  WEIGHT BEARING RESTRICTIONS: No  FALLS: Has patient fallen in last 6 months? Yes. Number of falls 2 syncopal episode and another fall down stairs  LIVING ENVIRONMENT: Lives with: lives with their spouse and lives with their daughter Lives in: House/apartment Stairs:  2 steps to enter; stairs to basement which is steep with handrail   PLOF: Independent; stay at home mom  PATIENT GOALS: improve migraine  OBJECTIVE:  [facility] Adult PT Treatment:  DATE: 11/17/22  Vitals: HR=81 bpm, BP=123/95 Therapeutic Exercise: Supine Scalene stretch, adding head rotation  Seated Using belt to depress shoulder-- upper trap stretch, then SCM with clavicular stabilization Manual Therapy: STM for bilat suboccipitals, cervical multifidi, scalenes, upper trap Trigger Point Dry-Needling  Treatment instructions: Expect mild to moderate muscle soreness. S/S of pneumothorax if dry needled over a lung field, and to seek immediate medical attention should they occur. Patient verbalized understanding of these instructions and education. Patient Consent Given: Yes Education  handout provided: Previously provided Muscles treated: bilat upper trap/levator, bilat suboccipitals, cervical multifidi mid c-spine Treatment response/outcome: palpable lengthening NSelf Care: Discussed HEP and importance of cervical strengthening/stability and postural demands with young kids.   PATIENT EDUCATION: Education details: verbal review of HEP and update for gentle neck stretches  Person educated: Patient Education method: Explanation, Demonstration, Tactile cues, and Verbal cues Education comprehension: verbalized understanding  HOME EXERCISE PROGRAM: Access Code: [email or web address] URL: [email or web address] Date: 11/17/2022 Prepared by: Margretta Ditty  Exercises - Seated Shoulder Horizontal Abduction with Resistance  - 1 x daily - 5 x weekly - 2 sets - 10 reps - Standing Shoulder External Rotation with Resistance  - 1 x daily - 5 x weekly - 2 sets - 10 reps - Cervical Retraction with Overpressure  - 1 x daily - 5 x weekly - 2 sets - 10 reps - 3 sec hold - Thoracic Extension Mobilization on Foam Roll  - 1 x daily - 5 x weekly - 2 sets - 10 reps - Supine Isometric Neck Extension  - 1 x daily - 5 x weekly - 2 sets - 10 reps - 3 sec hold - Supine Anterior Scalene Stretch  - 1 x daily - 5 x weekly - 1 sets - 3 reps - Gentle Levator Scapulae Stretch  - 1 x daily - 5 x weekly - 2 sets - 30 sec hold - Seated Thoracic Flexion and Rotation with Swiss Ball  - 1 x daily - 5 x weekly - 2 sets - 10 reps - 5 sec hold - Seated Scalene Stretch with Towel  - 1 x daily - 5 x weekly - 1 sets - 2 reps    ___________________________________________________________________________________________ Below measures were taken at time of initial evaluation unless otherwise specified:  DIAGNOSTIC FINDINGS: none recent  COGNITION: Overall cognitive status: Within functional limits for tasks assessed   SENSATION: Reports hx of peripheral neuropathy in R>L LEs   POSTURE:  rounded  shoulders and increased lumbar lordosis  PALPATION: soft tissue restriction in B UT and LS, proximal biceps, B suboccipitals   Cervical ROM:   Active A/PROM (deg) eval  Flexion 60  Extension 77  Right lateral flexion 64  Left lateral flexion 53  Right rotation 85  Left rotation 83  (Blank rows = not tested)  Cervical MMT:    Active eval  Flexion 4+  Extension 4+  Right lateral flexion 4+  Left lateral flexion 4+  Right rotation 4+  Left rotation 4+  (Blank rows = not tested)  UPPER EXTREMITY MMT:  MMT Right eval Left eval  Shoulder flexion 4+ 4+  Shoulder extension 4+ 4+  Shoulder abduction    Shoulder adduction    Shoulder extension    Shoulder internal rotation 4+ 4+  Shoulder external rotation 4+ 4+  Middle trapezius    Lower trapezius    Elbow flexion 4+ 4+  Elbow extension 4+ 4+  Wrist flexion 4 4- *pain  Wrist extension 4 *pain 4- *pain  Wrist ulnar deviation    Wrist radial deviation    Wrist pronation    Wrist supination    Grip strength     (Blank rows = not tested)   GAIT: Gait pattern: WFL Assistive device utilized: None Level of assistance: Complete Independence  PATIENT SURVEYS:  NDI: 32/50   GOALS: Goals reviewed with patient? Yes  SHORT TERM GOALS: Target date: 10/21/2022  Patient to be independent with initial HEP. Baseline: HEP initiated Goal status: MET 10/07/22    LONG TERM GOALS: Target date: 12/09/2022  Patient to be independent with advanced HEP. Baseline: Not yet initiated ; met for current 11/11/22 Goal status: IN PROGRESS 11/11/22  Patient to report 70% improvement in neck pain.  Baseline: Unable; 68% 11/11/22 Goal status: IN PROGRESS 11/11/22  Patient to score 24/50 on NDI in order to demonstrate moderate disability from neck pain. Baseline: 32/50- severe disability ; 18/50 11/11/22 Goal status: MET 11/11/22  Patient to demonstrate 50% improvement in palpable soft tissue restriction  Baseline: 30% improvement 11/11/22 Goal  status: IN PROGRESS 11/11/22  Patient to report 2 migraines/month on average.  Baseline: 3 migraines/month; unchanged 11/11/22 Goal status: IN PROGRESS 11/11/22    ASSESSMENT:  CLINICAL IMPRESSION: The patient had a migraine with neck tightness over the weekend. PT focused on suboccipital release with STM and DN. Patient tolerated well. She has palpable tightness in bilat scalenes-- worked on stretching for lengthening and emphasized need for ongoing strengthening to provide stability. PT to continue to work towards unmet LTGs.  OBJECTIVE IMPAIRMENTS: dizziness, increased muscle spasms, improper body mechanics, postural dysfunction, and pain.   PLAN:  PT FREQUENCY: 1x/week  PT DURATION: 6 weeks  PLANNED INTERVENTIONS: Therapeutic exercises, Therapeutic activity, Neuromuscular re-education, Balance training, Gait training, Patient/Family education, Self Care, Joint mobilization, Stair training, Vestibular training, Canalith repositioning, Aquatic Therapy, Dry Needling, Electrical stimulation, Cryotherapy, Moist heat, Taping, Manual therapy, and Re-evaluation  PLAN FOR NEXT SESSION: will require Medicaid auth on 11/24/22; STM to cervical musculature as long as patient is comfortable lying prone; progress gentle postural strengthening and stabilization exercises   Nijee Heatwole, PT 11/17/2022  [facility] at [facility] [address] [place], [place] [address] Phone # [phone] Fax # [phone]

## 2022-11-24 ENCOUNTER — Ambulatory Visit: Payer: Medicaid Other | Admitting: Rehabilitative and Restorative Service Providers"

## 2022-11-24 ENCOUNTER — Encounter: Payer: Self-pay | Admitting: Rehabilitative and Restorative Service Providers"

## 2022-11-24 DIAGNOSIS — M357 Hypermobility syndrome: Secondary | ICD-10-CM

## 2022-11-24 DIAGNOSIS — R519 Headache, unspecified: Secondary | ICD-10-CM | POA: Diagnosis not present

## 2022-11-24 DIAGNOSIS — M6281 Muscle weakness (generalized): Secondary | ICD-10-CM

## 2022-11-24 DIAGNOSIS — M542 Cervicalgia: Secondary | ICD-10-CM

## 2022-11-24 DIAGNOSIS — G8929 Other chronic pain: Secondary | ICD-10-CM

## 2022-11-24 NOTE — Therapy (Unsigned)
OUTPATIENT PHYSICAL THERAPY TREATMENT   Patient Name: [name] [name] MRN: [id number] DOB:1991-02-21, 32 y.o., female Today's Date: 11/24/2022  END OF SESSION:  PT End of Session - 11/24/22 1320     Visit Number 9    Number of Visits 11    Date for PT Re-Evaluation 12/09/22    Authorization Type [employer]    Authorization Time Period approved 9 PT visits from 09/30/2022 - 11/28/2022    Authorization - Number of Visits 9    PT Start Time 1320    PT Stop Time 1400    PT Time Calculation (min) 40 min    Activity Tolerance Patient tolerated treatment well    Behavior During Therapy WFL for tasks assessed/performed            Past Medical History:  Diagnosis Date   Depression    EDS (Ehlers-Danlos syndrome)    Hyperlipidemia    Migraine    Ovarian cyst    POTS (postural orthostatic tachycardia syndrome)    Preeclampsia, severe    Transaminitis    Vitamin B12 deficiency    Vitamin D deficiency    Past Surgical History:  Procedure Laterality Date   CESAREAN SECTION     EYE SURGERY     x2   WISDOM TOOTH EXTRACTION     Patient Active Problem List   Diagnosis Date Noted   Primary hypertension 06/07/2020   NASH (nonalcoholic steatohepatitis) 06/26/2019   Combined hyperlipidemia 06/26/2019    PCP: Mila Palmer, MD REFERRING PROVIDER: Mickel Baas, FNP REFERRING DIAG: [phone] (ICD-10-CM) - Chronic migraine without aura, intractable, without status migrainosus  THERAPY DIAG:  Chronic nonintractable headache, unspecified headache type  Cervicalgia  Hypermobility syndrome  Muscle weakness (generalized)  ONSET DATE: started getting migraines and POTs in 2009  Rationale for Evaluation and Treatment: Rehabilitation  SUBJECTIVE:   SUBJECTIVE STATEMENT: The patient had onset of headache on Sunday night -- she felt the change in weather has led to tightness and pressure in her lower neck radiating into the back of the head. She has taken migraine  medication. Her migraines last for 4-6 days. Pt accompanied by: self PERTINENT HISTORY: Depression, ehlers-danlos syndrome, HLD, migraine, POTS  PAIN:  Are you having pain? Yes: NPRS scale: 5/10 Pain location: neck -- central and headache Pain description: headache and neck tightness Aggravating factors: heat, ice, massage pillow  Relieving factors: tylenol, benadryl, Rizatriptan  Are you having pain? Yes: NPRS scale: 1/10 Pain location: R midback Pain description: sore Aggravating factors: lifting, bending, prolonged standing  Relieving factors: heat, ice, massage pillow  PRECAUTIONS: patient is [redacted] weeks pregnant (pt reports a lowlying placenta and was told not to do any strenuous exercise), ehlers-danlos syndrome, POTS  WEIGHT BEARING RESTRICTIONS: No  FALLS: Has patient fallen in last 6 months? Yes. Number of falls 2 syncopal episode and another fall down stairs  LIVING ENVIRONMENT: Lives with: lives with their spouse and lives with their daughter Lives in: House/apartment Stairs:  2 steps to enter; stairs to basement which is steep with handrail   PLOF: Independent; stay at home mom  PATIENT GOALS: improve migraine  OBJECTIVE:  [facility] Adult PT Treatment:                                                DATE: 11/24/22 Therapeutic Exercise: Seated  A/ROM c-spine Scalene stretch  Prone Superman overhead lift x 10 reps bilaterally W x 10 reps Shoulder extension x 10 Scapular retraction + depresion x 10 reps Quadriped  Thread the needle x 5 reps bilaterally Supine Chin tucks x 10 reps into deflated ball for deep neck stabilizers Yoga block under head for self traction at home Manual Therapy: STM cervical multifidi, suboccipitals STM upper trap Trigger Point Dry-Needling  Treatment instructions: Expect mild to moderate muscle soreness. S/S of pneumothorax if dry needled over a lung field, and to seek immediate medical attention should they occur. Patient verbalized  understanding of these instructions and education. Patient Consent Given: Yes Education handout provided: Previously provided Muscles treated: cervical multifidi, bilat upper trap/levator Treatment response/outcome: palpable lengthening  [facility] Adult PT Treatment:                                                DATE: 11/17/22  Vitals: HR=81 bpm, BP=123/95 Therapeutic Exercise: Supine Scalene stretch, adding head rotation  Seated Using belt to depress shoulder-- upper trap stretch, then SCM with clavicular stabilization Manual Therapy: STM for bilat suboccipitals, cervical multifidi, scalenes, upper trap Trigger Point Dry-Needling  Treatment instructions: Expect mild to moderate muscle soreness. S/S of pneumothorax if dry needled over a lung field, and to seek immediate medical attention should they occur. Patient verbalized understanding of these instructions and education. Patient Consent Given: Yes Education handout provided: Previously provided Muscles treated: bilat upper trap/levator, bilat suboccipitals, cervical multifidi mid c-spine Treatment response/outcome: palpable lengthening NSelf Care: Discussed HEP and importance of cervical strengthening/stability and postural demands with young kids.   PATIENT EDUCATION: Education details: verbal review of HEP and update for gentle neck stretches  Person educated: Patient Education method: Explanation, Demonstration, Tactile cues, and Verbal cues Education comprehension: verbalized understanding  HOME EXERCISE PROGRAM: Access Code: [email or web address] URL: [email or web address] Date: 11/17/2022 Prepared by: Margretta Ditty  Exercises - Seated Shoulder Horizontal Abduction with Resistance  - 1 x daily - 5 x weekly - 2 sets - 10 reps - Standing Shoulder External Rotation with Resistance  - 1 x daily - 5 x weekly - 2 sets - 10 reps - Cervical Retraction with Overpressure  - 1 x daily - 5 x weekly - 2 sets - 10 reps - 3 sec  hold - Thoracic Extension Mobilization on Foam Roll  - 1 x daily - 5 x weekly - 2 sets - 10 reps - Supine Isometric Neck Extension  - 1 x daily - 5 x weekly - 2 sets - 10 reps - 3 sec hold - Supine Anterior Scalene Stretch  - 1 x daily - 5 x weekly - 1 sets - 3 reps - Gentle Levator Scapulae Stretch  - 1 x daily - 5 x weekly - 2 sets - 30 sec hold - Seated Thoracic Flexion and Rotation with Swiss Ball  - 1 x daily - 5 x weekly - 2 sets - 10 reps - 5 sec hold - Seated Scalene Stretch with Towel  - 1 x daily - 5 x weekly - 1 sets - 2 reps   ___________________________________________________________________________________________ Below measures were taken at time of initial evaluation unless otherwise specified:  DIAGNOSTIC FINDINGS: none recent  COGNITION: Overall cognitive status: Within functional limits for tasks assessed   SENSATION: Reports hx of peripheral neuropathy in R>L LEs   POSTURE:  rounded shoulders and increased lumbar  lordosis  PALPATION: soft tissue restriction in B UT and LS, proximal biceps, B suboccipitals   Cervical ROM:   Active A/PROM (deg) eval  Flexion 60  Extension 77  Right lateral flexion 64  Left lateral flexion 53  Right rotation 85  Left rotation 83  (Blank rows = not tested)  Cervical MMT:   Active eval  Flexion 4+  Extension 4+  Right lateral flexion 4+  Left lateral flexion 4+  Right rotation 4+  Left rotation 4+  (Blank rows = not tested)  UPPER EXTREMITY MMT: MMT Right eval Left eval  Shoulder flexion 4+ 4+  Shoulder extension 4+ 4+  Shoulder abduction    Shoulder adduction    Shoulder extension    Shoulder internal rotation 4+ 4+  Shoulder external rotation 4+ 4+  Middle trapezius    Lower trapezius    Elbow flexion 4+ 4+  Elbow extension 4+ 4+  Wrist flexion 4 4- *pain  Wrist extension 4 *pain 4- *pain  Wrist ulnar deviation    Wrist radial deviation    Wrist pronation    Wrist supination    Grip strength      (Blank rows = not tested)   GAIT: Gait pattern: WFL Assistive device utilized: None Level of assistance: Complete Independence  PATIENT SURVEYS:  NDI: 32/50   GOALS: Goals reviewed with patient? Yes  SHORT TERM GOALS: Target date: 10/21/2022  Patient to be independent with initial HEP. Baseline: HEP initiated Goal status: MET 10/07/22   LONG TERM GOALS: Target date: 12/09/2022  Patient to be independent with advanced HEP. Baseline: Not yet initiated ; met for current 11/11/22 Goal status: MET on 11/24/22  Patient to report 70% improvement in neck pain.  Baseline: Unable; 68% 11/11/22 Goal status: PARTIALLY MET -- migraines are shorter with less intensity, overall neck pain has improved 55%.  Patient to score 24/50 on NDI in order to demonstrate moderate disability from neck pain. Baseline: 32/50- severe disability ; 18/50 11/11/22 Goal status: MET 11/11/22  Patient to demonstrate 50% improvement in palpable soft tissue restriction  Baseline: 30% improvement 11/11/22 Goal status: IN PROGRESS 11/11/22  Patient to report 2 migraines/month on average.  Baseline: 3 migraines/month; unchanged 11/11/22 Goal status: MET-- on 11/24/22, she notes 1-2 migraines per month. It is weather dependent  UPDATED LONG TERM GOALS: 12/25/22 1) Patient will be independent with advanced HEP. Baseline: has initial HEP Goals status: Revised  2) Patient will demonstrate 50% improvement in palpable soft tissue restriction. Baseline: 30% improvement 11/11/22 Goals Status: Revised    3) Patient will improve neck disability index from 18/50 to < or equal to 14 points to demonstrate mild deficits.   Baseline: 18 /50    Goal status: Revised  4) Patient to report 70% improvement in neck pain.  Baseline: 68% 11/11/22, 55% on 11/24/22 (had migraine last few days) Goal status: Revised    ASSESSMENT:  CLINICAL IMPRESSION: The patient has partially met and met all LTGs. She has had a migraine the past few days and  therefore notes more nec pain today. PT updated long term goals with focus on reducing NDI to mild categorization, continuing to work on postural stabilization, and progressing self management techniques to reduce intensity and duration of migraines.  OBJECTIVE IMPAIRMENTS: dizziness, increased muscle spasms, improper body mechanics, postural dysfunction, and pain.   PLAN:  PT FREQUENCY: 1x/week  PT DURATION: 4 weeks  PLANNED INTERVENTIONS: Therapeutic exercises, Therapeutic activity, Neuromuscular re-education, Balance training, Gait training, Patient/Family education, Self  Care, Joint mobilization, Stair training, Vestibular training, Canalith repositioning, Aquatic Therapy, Dry Needling, Electrical stimulation, Cryotherapy, Moist heat, Taping, Manual therapy, and Re-evaluation  PLAN FOR NEXT SESSION: will require Medicaid auth on 11/24/22; STM to cervical musculature as long as patient is comfortable lying prone; progress gentle postural strengthening and stabilization exercises   Stephene Alegria, PT 11/24/2022  [facility] at [facility] [address], [address] [place], [place] [address] Phone # [phone] Fax # [phone]

## 2022-11-30 NOTE — Therapy (Signed)
OUTPATIENT PHYSICAL THERAPY TREATMENT    Patient Name: [name] [name] MRN: [id number] DOB:1990/12/02, 32 y.o., female Today's Date: 12/01/2022  END OF SESSION:  PT End of Session - 12/01/22 1059     Visit Number 10    Number of Visits 13    Date for PT Re-Evaluation 12/25/22    Authorization Type [employer]    Authorization Time Period approved 5 PT visits from 11/26/2022 - 10/20/202    Authorization - Visit Number 1    Authorization - Number of Visits 5    PT Start Time 1015    PT Stop Time 1058    PT Time Calculation (min) 43 min    Activity Tolerance Patient tolerated treatment well    Behavior During Therapy WFL for tasks assessed/performed             Past Medical History:  Diagnosis Date   Depression    EDS (Ehlers-Danlos syndrome)    Hyperlipidemia    Migraine    Ovarian cyst    POTS (postural orthostatic tachycardia syndrome)    Preeclampsia, severe    Transaminitis    Vitamin B12 deficiency    Vitamin D deficiency    Past Surgical History:  Procedure Laterality Date   CESAREAN SECTION     EYE SURGERY     x2   WISDOM TOOTH EXTRACTION     Patient Active Problem List   Diagnosis Date Noted   Primary hypertension 06/07/2020   NASH (nonalcoholic steatohepatitis) 06/26/2019   Combined hyperlipidemia 06/26/2019    PCP: Mila Palmer, MD REFERRING PROVIDER: Mickel Baas, FNP REFERRING DIAG: [phone] (ICD-10-CM) - Chronic migraine without aura, intractable, without status migrainosus  THERAPY DIAG:  Chronic nonintractable headache, unspecified headache type  Cervicalgia  Hypermobility syndrome  ONSET DATE: started getting migraines and POTs in 2009  Rationale for Evaluation and Treatment: Rehabilitation  SUBJECTIVE:   SUBJECTIVE STATEMENT: Had a migraine 2 weeks ago. Has been working on deep stretching lately which brought on some soreness at first which has since resolved.  Pt accompanied by: self PERTINENT HISTORY:  Depression, ehlers-danlos syndrome, HLD, migraine, POTS  PAIN:  Are you having pain? Yes: NPRS scale: 0/10 Pain location: neck -- central and headache Pain description: headache and neck tightness Aggravating factors: heat, ice, massage pillow  Relieving factors: tylenol, benadryl, Rizatriptan  Are you having pain? Yes: NPRS scale: 1/10 Pain location: R midback Pain description: sore Aggravating factors: lifting, bending, prolonged standing  Relieving factors: heat, ice, massage pillow  PRECAUTIONS: patient is [redacted] weeks pregnant (pt reports a lowlying placenta and was told not to do any strenuous exercise), ehlers-danlos syndrome, POTS  WEIGHT BEARING RESTRICTIONS: No  FALLS: Has patient fallen in last 6 months? Yes. Number of falls 2 syncopal episode and another fall down stairs  LIVING ENVIRONMENT: Lives with: lives with their spouse and lives with their daughter Lives in: House/apartment Stairs:  2 steps to enter; stairs to basement which is steep with handrail   PLOF: Independent; stay at home mom  PATIENT GOALS: improve migraine  OBJECTIVE:    TODAY'S TREATMENT: 12/01/22 Activity Comments  quadruped cervical retraction + midrange rotation Good tolerance   bear crawl lifts 5x5" Cues to shift forward onto hands and maintain; required break after 4 reps  bird dog 10x  More difficulty with R LE stabilizing   serratus slides with yellow TB and foam roll 2x10 Cueing for parallel forearms   corner pec stretch 30" Cues to avoid excessive stretch to protect  shoulders   Skilled palpation and monitoring of tissues during TPDN Palpable soft tissue restriction and trigger points evident     Trigger Point Dry-Needling  Treatment instructions: Expect mild to moderate muscle soreness. S/S of pneumothorax if dry needled over a lung field, and to seek immediate medical attention should they occur. Patient verbalized understanding of these instructions and education.  Patient Consent  Given: Yes Education handout provided: Previously provided Muscles treated: B splenius cervicis, B UT using standard technique  Electrical stimulation performed: No Parameters: N/A Treatment response/outcome: tolerated well ; reported improved tension     HOME EXERCISE PROGRAM: Access Code: [email or web address] URL: [email or web address] Date: 12/01/2022 Prepared by: [facility]patient  Re[facility] - [facility]  Exercises - Seated Shoulder Horizontal Abduction with Resistance  - 1 x daily - 5 x weekly - 2 sets - 10 reps - Standing Shoulder External Rotation with Resistance  - 1 x daily - 5 x weekly - 2 sets - 10 reps - Cervical Retraction with Overpressure  - 1 x daily - 5 x weekly - 2 sets - 10 reps - 3 sec hold - Supine Isometric Neck Extension  - 1 x daily - 5 x weekly - 2 sets - 10 reps - 3 sec hold - Thoracic Extension Mobilization on Foam Roll  - 1 x daily - 5 x weekly - 2 sets - 10 reps - Gentle Levator Scapulae Stretch  - 1 x daily - 5 x weekly - 2 sets - 30 sec hold - Seated Thoracic Flexion and Rotation with Swiss Ball  - 1 x daily - 5 x weekly - 2 sets - 10 reps - 5 sec hold - Seated Scalene Stretch with Towel  - 1 x daily - 5 x weekly - 1 sets - 2 reps - Bird Dog  - 1 x daily - 5 x weekly - 2 sets - 10 reps   PATIENT EDUCATION: Education details: HEP update Person educated: Patient Education method: Explanation, Demonstration, Tactile cues, Verbal cues, and Handouts Education comprehension: verbalized understanding and returned demonstration    ___________________________________________________________________________________________ Below measures were taken at time of initial evaluation unless otherwise specified:  DIAGNOSTIC FINDINGS: none recent  COGNITION: Overall cognitive status: Within functional limits for tasks assessed   SENSATION: Reports hx of peripheral neuropathy in R>L LEs   POSTURE:  rounded shoulders and increased lumbar  lordosis  PALPATION: soft tissue restriction in B UT and LS, proximal biceps, B suboccipitals   Cervical ROM:   Active A/PROM (deg) eval  Flexion 60  Extension 77  Right lateral flexion 64  Left lateral flexion 53  Right rotation 85  Left rotation 83  (Blank rows = not tested)  Cervical MMT:   Active eval  Flexion 4+  Extension 4+  Right lateral flexion 4+  Left lateral flexion 4+  Right rotation 4+  Left rotation 4+  (Blank rows = not tested)  UPPER EXTREMITY MMT: MMT Right eval Left eval  Shoulder flexion 4+ 4+  Shoulder extension 4+ 4+  Shoulder abduction    Shoulder adduction    Shoulder extension    Shoulder internal rotation 4+ 4+  Shoulder external rotation 4+ 4+  Middle trapezius    Lower trapezius    Elbow flexion 4+ 4+  Elbow extension 4+ 4+  Wrist flexion 4 4- *pain  Wrist extension 4 *pain 4- *pain  Wrist ulnar deviation    Wrist radial deviation    Wrist pronation    Wrist supination  Grip strength     (Blank rows = not tested)   GAIT: Gait pattern: WFL Assistive device utilized: None Level of assistance: Complete Independence  PATIENT SURVEYS:  NDI: 32/50   GOALS: Goals reviewed with patient? Yes  SHORT TERM GOALS: Target date: 10/21/2022  Patient to be independent with initial HEP. Baseline: HEP initiated Goal status: MET 10/07/22   LONG TERM GOALS: Target date: 12/09/2022  Patient to be independent with advanced HEP. Baseline: Not yet initiated ; met for current 11/11/22 Goal status: MET on 11/24/22  Patient to report 70% improvement in neck pain.  Baseline: Unable; 68% 11/11/22 Goal status: PARTIALLY MET -- migraines are shorter with less intensity, overall neck pain has improved 55%.  Patient to score 24/50 on NDI in order to demonstrate moderate disability from neck pain. Baseline: 32/50- severe disability ; 18/50 11/11/22 Goal status: MET 11/11/22  Patient to demonstrate 50% improvement in palpable soft tissue restriction   Baseline: 30% improvement 11/11/22 Goal status: IN PROGRESS 11/11/22  Patient to report 2 migraines/month on average.  Baseline: 3 migraines/month; unchanged 11/11/22 Goal status: MET-- on 11/24/22, she notes 1-2 migraines per month. It is weather dependent  UPDATED LONG TERM GOALS: 12/25/22 1) Patient will be independent with advanced HEP. Baseline: has initial HEP Goals status: Revised  2) Patient will demonstrate 50% improvement in palpable soft tissue restriction. Baseline: 30% improvement 11/11/22 Goals Status: Revised    3) Patient will improve neck disability index from 18/50 to < or equal to 14 points to demonstrate mild deficits.   Baseline: 18 /50    Goal status: Revised  4) Patient to report 70% improvement in neck pain.  Baseline: 68% 11/11/22, 55% on 11/24/22 (had migraine last few days) Goal status: Revised    ASSESSMENT:  CLINICAL IMPRESSION: Patient arrived to session without new complaints. Core and scapular stability work was performed with cueing for form. Stretching activities required adjustment to avoid excessive stretch and to protect joints. Patient tolerated DN very well and with good twitch response noted as well as report of improved tension. Patient reported understanding of HEP update and without complaint upon leaving.  OBJECTIVE IMPAIRMENTS: dizziness, increased muscle spasms, improper body mechanics, postural dysfunction, and pain.   PLAN:  PT FREQUENCY: 1x/week  PT DURATION: 4 weeks  PLANNED INTERVENTIONS: Therapeutic exercises, Therapeutic activity, Neuromuscular re-education, Balance training, Gait training, Patient/Family education, Self Care, Joint mobilization, Stair training, Vestibular training, Canalith repositioning, Aquatic Therapy, Dry Needling, Electrical stimulation, Cryotherapy, Moist heat, Taping, Manual therapy, and Re-evaluation  PLAN FOR NEXT SESSION: STM to cervical musculature as long as patient is comfortable lying prone; progress  gentle postural strengthening and stabilization exercises    Check all possible CPT codes: 13244- Therapeutic Exercise, [phone]- Neuro Re-education, 97140 - Manual Therapy, 97530 - Therapeutic Activities, and 97535 - Self Care    Check all conditions that are expected to impact treatment: {Conditions expected to impact treatment:Musculoskeletal disorders, Neurological condition and/or seizures, and Current pregnancy or recent postpartum   If treatment provided at initial evaluation, no treatment charged due to lack of authorization.      Anette Guarneri, PT, DPT 12/01/22 11:03 AM  [facility] at [facility] [address] [place], [address] [place], [place] [address] Phone # [phone] Fax # [phone]

## 2022-12-01 ENCOUNTER — Encounter: Payer: Self-pay | Admitting: Physical Therapy

## 2022-12-01 ENCOUNTER — Ambulatory Visit: Payer: Medicaid Other | Admitting: Physical Therapy

## 2022-12-01 DIAGNOSIS — M542 Cervicalgia: Secondary | ICD-10-CM

## 2022-12-01 DIAGNOSIS — M357 Hypermobility syndrome: Secondary | ICD-10-CM

## 2022-12-01 DIAGNOSIS — R519 Headache, unspecified: Secondary | ICD-10-CM

## 2022-12-08 NOTE — Therapy (Signed)
OUTPATIENT PHYSICAL THERAPY TREATMENT    Patient Name: [name] [name] MRN: [id number] DOB:1990-05-20, 32 y.o., female Today's Date: 12/09/2022  END OF SESSION:  PT End of Session - 12/09/22 1102     Visit Number 11    Number of Visits 13    Date for PT Re-Evaluation 12/25/22    Authorization Type [employer]    Authorization Time Period approved 5 PT visits from 11/26/2022 - 10/20/202    Authorization - Visit Number 2    Authorization - Number of Visits 5    PT Start Time 1019    PT Stop Time 1100    PT Time Calculation (min) 41 min    Activity Tolerance Patient tolerated treatment well    Behavior During Therapy WFL for tasks assessed/performed              Past Medical History:  Diagnosis Date   Depression    EDS (Ehlers-Danlos syndrome)    Hyperlipidemia    Migraine    Ovarian cyst    POTS (postural orthostatic tachycardia syndrome)    Preeclampsia, severe    Transaminitis    Vitamin B12 deficiency    Vitamin D deficiency    Past Surgical History:  Procedure Laterality Date   CESAREAN SECTION     EYE SURGERY     x2   WISDOM TOOTH EXTRACTION     Patient Active Problem List   Diagnosis Date Noted   Primary hypertension 06/07/2020   NASH (nonalcoholic steatohepatitis) 06/26/2019   Combined hyperlipidemia 06/26/2019    PCP: Mila Palmer, MD REFERRING PROVIDER: Mickel Baas, FNP REFERRING DIAG: [phone] (ICD-10-CM) - Chronic migraine without aura, intractable, without status migrainosus  THERAPY DIAG:  Chronic nonintractable headache, unspecified headache type  Cervicalgia  Hypermobility syndrome  ONSET DATE: started getting migraines and POTs in 2009  Rationale for Evaluation and Treatment: Rehabilitation  SUBJECTIVE:   SUBJECTIVE STATEMENT: Did the bird dog twice and it hurt my hips. I am exhausted d/t daughter not sleeping well this week. Also went to the beach and brought her TBs. Had a migraine over the weekend but neck pain  has not been too bad.  Pt accompanied by: self PERTINENT HISTORY: Depression, ehlers-danlos syndrome, HLD, migraine, POTS  PAIN:  Are you having pain? Yes: NPRS scale: 3/10 Pain location: headache and B UT Pain description: headache and neck tightness Aggravating factors: heat, ice, massage pillow  Relieving factors: tylenol, benadryl, Rizatriptan   PRECAUTIONS: patient is [redacted] weeks pregnant (pt reports a lowlying placenta and was told not to do any strenuous exercise), ehlers-danlos syndrome, POTS  WEIGHT BEARING RESTRICTIONS: No  FALLS: Has patient fallen in last 6 months? Yes. Number of falls 2 syncopal episode and another fall down stairs  LIVING ENVIRONMENT: Lives with: lives with their spouse and lives with their daughter Lives in: House/apartment Stairs:  2 steps to enter; stairs to basement which is steep with handrail   PLOF: Independent; stay at home mom  PATIENT GOALS: improve migraine  OBJECTIVE:     TODAY'S TREATMENT: 12/09/22 Activity Comments     cervical retraction + neck rotation 2x5 reps  Cueing to perform 50% effort to avoid neck straining   cervical retraction + neck extension  2x5 reps  Cueing to perform 50% effort to avoid neck straining   sitting D2 flexion with red TB 10x  Tolerated well   sitting B shoulder ER red TB 10x  Against doorframe for tactile feedback   push up plus against counter  top 10x  Cues for proper form with serratus punch  STM and manual TPR in supine Palpable restriction in B scalenes, SCM, UT          HOME EXERCISE PROGRAM: Access Code: [email or web address] URL: [email or web address] Date: 12/01/2022 Prepared by: [facility] - Outpatient  Rehab - [facility]  Exercises - Seated Shoulder Horizontal Abduction with Resistance  - 1 x daily - 5 x weekly - 2 sets - 10 reps - Standing Shoulder External Rotation with Resistance  - 1 x daily - 5 x weekly - 2 sets - 10 reps - Cervical Retraction with Overpressure  - 1 x daily -  5 x weekly - 2 sets - 10 reps - 3 sec hold - Supine Isometric Neck Extension  - 1 x daily - 5 x weekly - 2 sets - 10 reps - 3 sec hold - Thoracic Extension Mobilization on Foam Roll  - 1 x daily - 5 x weekly - 2 sets - 10 reps - Gentle Levator Scapulae Stretch  - 1 x daily - 5 x weekly - 2 sets - 30 sec hold - Seated Thoracic Flexion and Rotation with Swiss Ball  - 1 x daily - 5 x weekly - 2 sets - 10 reps - 5 sec hold - Seated Scalene Stretch with Towel  - 1 x daily - 5 x weekly - 1 sets - 2 reps - Bird Dog  - 1 x daily - 5 x weekly - 2 sets - 10 reps   PATIENT EDUCATION: Education details: HEP update- removed bird dog, edu on pelvic floor PT at [facility] d/t hip pain and hx of hypermobility  Person educated: Patient Education method: Explanation, Demonstration, Tactile cues, Verbal cues, and Handouts Education comprehension: verbalized understanding and returned demonstration    ___________________________________________________________________________________________ Below measures were taken at time of initial evaluation unless otherwise specified:  DIAGNOSTIC FINDINGS: none recent  COGNITION: Overall cognitive status: Within functional limits for tasks assessed   SENSATION: Reports hx of peripheral neuropathy in R>L LEs   POSTURE:  rounded shoulders and increased lumbar lordosis  PALPATION: soft tissue restriction in B UT and LS, proximal biceps, B suboccipitals   Cervical ROM:   Active A/PROM (deg) eval  Flexion 60  Extension 77  Right lateral flexion 64  Left lateral flexion 53  Right rotation 85  Left rotation 83  (Blank rows = not tested)  Cervical MMT:   Active eval  Flexion 4+  Extension 4+  Right lateral flexion 4+  Left lateral flexion 4+  Right rotation 4+  Left rotation 4+  (Blank rows = not tested)  UPPER EXTREMITY MMT: MMT Right eval Left eval  Shoulder flexion 4+ 4+  Shoulder extension 4+ 4+  Shoulder abduction    Shoulder  adduction    Shoulder extension    Shoulder internal rotation 4+ 4+  Shoulder external rotation 4+ 4+  Middle trapezius    Lower trapezius    Elbow flexion 4+ 4+  Elbow extension 4+ 4+  Wrist flexion 4 4- *pain  Wrist extension 4 *pain 4- *pain  Wrist ulnar deviation    Wrist radial deviation    Wrist pronation    Wrist supination    Grip strength     (Blank rows = not tested)   GAIT: Gait pattern: WFL Assistive device utilized: None Level of assistance: Complete Independence  PATIENT SURVEYS:  NDI: 32/50   GOALS: Goals reviewed with patient? Yes  SHORT TERM GOALS: Target date: 10/21/2022  Patient to be independent with initial HEP. Baseline: HEP initiated Goal status: MET 10/07/22   LONG TERM GOALS: Target date: 12/09/2022  Patient to be independent with advanced HEP. Baseline: Not yet initiated ; met for current 11/11/22 Goal status: MET on 11/24/22  Patient to report 70% improvement in neck pain.  Baseline: Unable; 68% 11/11/22 Goal status: PARTIALLY MET -- migraines are shorter with less intensity, overall neck pain has improved 55%.  Patient to score 24/50 on NDI in order to demonstrate moderate disability from neck pain. Baseline: 32/50- severe disability ; 18/50 11/11/22 Goal status: MET 11/11/22  Patient to demonstrate 50% improvement in palpable soft tissue restriction  Baseline: 30% improvement 11/11/22 Goal status: IN PROGRESS 11/11/22  Patient to report 2 migraines/month on average.  Baseline: 3 migraines/month; unchanged 11/11/22 Goal status: MET-- on 11/24/22, she notes 1-2 migraines per month. It is weather dependent  UPDATED LONG TERM GOALS: 12/25/22 1) Patient will be independent with advanced HEP. Baseline: has initial HEP Goals status: Revised  2) Patient will demonstrate 50% improvement in palpable soft tissue restriction. Baseline: 30% improvement 11/11/22 Goals Status: Revised    3) Patient will improve neck disability index from 18/50 to < or equal to  14 points to demonstrate mild deficits.   Baseline: 18 /50    Goal status: Revised  4) Patient to report 70% improvement in neck pain.  Baseline: 68% 11/11/22, 55% on 11/24/22 (had migraine last few days) Goal status: Revised    ASSESSMENT:  CLINICAL IMPRESSION: Patient arrived to session with report of some hip pain from previous HEP update. Removed birddog from HEP d/t pain and educated on possible benefits of pelvic floor PT d/t pt's hx of hypermobility. Patient performed cervical stability exercises as well as postural strength with good tolerance. Soft tissue restriction was addressed through MT which patient reported good relief from. No complaints upon leaving. Patient is progressing well towards goals.  OBJECTIVE IMPAIRMENTS: dizziness, increased muscle spasms, improper body mechanics, postural dysfunction, and pain.   PLAN:  PT FREQUENCY: 1x/week  PT DURATION: 4 weeks  PLANNED INTERVENTIONS: Therapeutic exercises, Therapeutic activity, Neuromuscular re-education, Balance training, Gait training, Patient/Family education, Self Care, Joint mobilization, Stair training, Vestibular training, Canalith repositioning, Aquatic Therapy, Dry Needling, Electrical stimulation, Cryotherapy, Moist heat, Taping, Manual therapy, and Re-evaluation  PLAN FOR NEXT SESSION: STM to cervical musculature as long as patient is comfortable lying prone; progress gentle postural strengthening and stabilization exercises     Anette Guarneri, PT, DPT 12/09/22 11:03 AM   Outpatient Rehab at [facility] [address], [address] [place], [place] [address] Phone # [phone] Fax # [phone]

## 2022-12-09 ENCOUNTER — Encounter: Payer: Self-pay | Admitting: Physical Therapy

## 2022-12-09 ENCOUNTER — Ambulatory Visit: Payer: Medicaid Other | Attending: Registered Nurse | Admitting: Physical Therapy

## 2022-12-09 DIAGNOSIS — R519 Headache, unspecified: Secondary | ICD-10-CM | POA: Insufficient documentation

## 2022-12-09 DIAGNOSIS — M542 Cervicalgia: Secondary | ICD-10-CM | POA: Insufficient documentation

## 2022-12-09 DIAGNOSIS — M357 Hypermobility syndrome: Secondary | ICD-10-CM | POA: Insufficient documentation

## 2022-12-09 DIAGNOSIS — G8929 Other chronic pain: Secondary | ICD-10-CM | POA: Insufficient documentation

## 2022-12-21 NOTE — Therapy (Signed)
OUTPATIENT PHYSICAL THERAPY TREATMENT    Patient Name: [name] [name] MRN: [id number] DOB:June 11, 1990, 32 y.o., female Today's Date: 12/23/2022  END OF SESSION:  PT End of Session - 12/23/22 1138     Visit Number 12    Number of Visits 13    Date for PT Re-Evaluation 12/25/22    Authorization Type [employer]    Authorization Time Period approved 5 PT visits from 11/26/2022 - 10/20/202    Authorization - Visit Number 3    Authorization - Number of Visits 5    PT Start Time 1103    PT Stop Time 1144    PT Time Calculation (min) 41 min    Activity Tolerance Patient tolerated treatment well    Behavior During Therapy WFL for tasks assessed/performed               Past Medical History:  Diagnosis Date   Depression    EDS (Ehlers-Danlos syndrome)    Hyperlipidemia    Migraine    Ovarian cyst    POTS (postural orthostatic tachycardia syndrome)    Preeclampsia, severe    Transaminitis    Vitamin B12 deficiency    Vitamin D deficiency    Past Surgical History:  Procedure Laterality Date   CESAREAN SECTION     EYE SURGERY     x2   WISDOM TOOTH EXTRACTION     Patient Active Problem List   Diagnosis Date Noted   Primary hypertension 06/07/2020   NASH (nonalcoholic steatohepatitis) 06/26/2019   Combined hyperlipidemia 06/26/2019    PCP: Mila Palmer, MD REFERRING PROVIDER: Mickel Baas, FNP REFERRING DIAG: [phone] (ICD-10-CM) - Chronic migraine without aura, intractable, without status migrainosus  THERAPY DIAG:  Chronic nonintractable headache, unspecified headache type  Cervicalgia  Hypermobility syndrome  ONSET DATE: started getting migraines and POTs in 2009  Rationale for Evaluation and Treatment: Rehabilitation  SUBJECTIVE:   SUBJECTIVE STATEMENT: I have a HA today- think it's the weather.  Reports that she has had 2 migraines in the past 2 weeks. Reports that her hips are still bothering her and planning to   Pt accompanied by:  self PERTINENT HISTORY: Depression, ehlers-danlos syndrome, HLD, migraine, POTS  PAIN:  Are you having pain? Yes: NPRS scale: 4/10 Pain location: posterior head Pain description: headache, sharp stabbing, tightness Aggravating factors: heat, ice, massage pillow  Relieving factors: tylenol, benadryl, Rizatriptan   PRECAUTIONS: patient is [redacted] weeks pregnant (pt reports a lowlying placenta and was told not to do any strenuous exercise), ehlers-danlos syndrome, POTS  WEIGHT BEARING RESTRICTIONS: No  FALLS: Has patient fallen in last 6 months? Yes. Number of falls 2 syncopal episode and another fall down stairs  LIVING ENVIRONMENT: Lives with: lives with their spouse and lives with their daughter Lives in: House/apartment Stairs:  2 steps to enter; stairs to basement which is steep with handrail   PLOF: Independent; stay at home mom  PATIENT GOALS: improve migraine  OBJECTIVE:    TODAY'S TREATMENT: 12/23/22 Activity Comments  STM and manual TPR, gentle central cervical PA's grade III   Soft tissue restriction and palpable trigger pts in cervical parapsinals, suboccipitals, UT, LS; pt reported "it feels so much better"  paloff press with green TB 15x each  Cues to keep core tight, rhythmic breathing   Green TB rows 2x20 Cues for slight posterior pelvic tilt; scapular retraction/depression   Green TB shoulder extension  2x20 Cues for slight posterior pelvic tilt; scapular retraction/depression  PATIENT EDUCATION: Education details: POC, HEP update Person educated: Patient Education method: Explanation Education comprehension: verbalized understanding   HOME EXERCISE PROGRAM: Access Code: [email or web address] Access Code: [email or web address] URL: [email or web address] Date: 12/23/2022 Prepared by: [facility] - Outpatient  Rehab - [facility]  Exercises - Seated Shoulder Horizontal Abduction with Resistance  - 1 x daily - 5 x weekly - 2 sets - 10 reps - Standing  Shoulder External Rotation with Resistance  - 1 x daily - 5 x weekly - 2 sets - 10 reps - Cervical Retraction with Overpressure  - 1 x daily - 5 x weekly - 2 sets - 10 reps - 3 sec hold - Supine Isometric Neck Extension  - 1 x daily - 5 x weekly - 2 sets - 10 reps - 3 sec hold - Thoracic Extension Mobilization on Foam Roll  - 1 x daily - 5 x weekly - 2 sets - 10 reps - Gentle Levator Scapulae Stretch  - 1 x daily - 5 x weekly - 2 sets - 30 sec hold - Seated Thoracic Flexion and Rotation with Swiss Ball  - 1 x daily - 5 x weekly - 2 sets - 10 reps - 5 sec hold - Seated Scalene Stretch with Towel  - 1 x daily - 5 x weekly - 1 sets - 2 reps - Anti-Rotation Press With Sidesteps and Anchored Resistance  - 1 x daily - 5 x weekly - 2 sets - 15 reps    ___________________________________________________________________________________________ Below measures were taken at time of initial evaluation unless otherwise specified:  DIAGNOSTIC FINDINGS: none recent  COGNITION: Overall cognitive status: Within functional limits for tasks assessed   SENSATION: Reports hx of peripheral neuropathy in R>L LEs   POSTURE:  rounded shoulders and increased lumbar lordosis  PALPATION: soft tissue restriction in B UT and LS, proximal biceps, B suboccipitals   Cervical ROM:   Active A/PROM (deg) eval  Flexion 60  Extension 77  Right lateral flexion 64  Left lateral flexion 53  Right rotation 85  Left rotation 83  (Blank rows = not tested)  Cervical MMT:   Active eval  Flexion 4+  Extension 4+  Right lateral flexion 4+  Left lateral flexion 4+  Right rotation 4+  Left rotation 4+  (Blank rows = not tested)  UPPER EXTREMITY MMT: MMT Right eval Left eval  Shoulder flexion 4+ 4+  Shoulder extension 4+ 4+  Shoulder abduction    Shoulder adduction    Shoulder extension    Shoulder internal rotation 4+ 4+  Shoulder external rotation 4+ 4+  Middle trapezius    Lower trapezius    Elbow  flexion 4+ 4+  Elbow extension 4+ 4+  Wrist flexion 4 4- *pain  Wrist extension 4 *pain 4- *pain  Wrist ulnar deviation    Wrist radial deviation    Wrist pronation    Wrist supination    Grip strength     (Blank rows = not tested)   GAIT: Gait pattern: WFL Assistive device utilized: None Level of assistance: Complete Independence  PATIENT SURVEYS:  NDI: 32/50   GOALS: Goals reviewed with patient? Yes  SHORT TERM GOALS: Target date: 10/21/2022  Patient to be independent with initial HEP. Baseline: HEP initiated Goal status: MET 10/07/22   LONG TERM GOALS: Target date: 12/09/2022  Patient to be independent with advanced HEP. Baseline: Not yet initiated ; met for current 11/11/22 Goal status: MET on 11/24/22  Patient to report 70% improvement in neck  pain.  Baseline: Unable; 68% 11/11/22 Goal status: PARTIALLY MET -- migraines are shorter with less intensity, overall neck pain has improved 55%.  Patient to score 24/50 on NDI in order to demonstrate moderate disability from neck pain. Baseline: 32/50- severe disability ; 18/50 11/11/22 Goal status: MET 11/11/22  Patient to demonstrate 50% improvement in palpable soft tissue restriction  Baseline: 30% improvement 11/11/22 Goal status: IN PROGRESS 11/11/22  Patient to report 2 migraines/month on average.  Baseline: 3 migraines/month; unchanged 11/11/22 Goal status: MET-- on 11/24/22, she notes 1-2 migraines per month. It is weather dependent  UPDATED LONG TERM GOALS: 12/25/22 1) Patient will be independent with advanced HEP. Baseline: has initial HEP Goals status: Revised  2) Patient will demonstrate 50% improvement in palpable soft tissue restriction. Baseline: 30% improvement 11/11/22 Goals Status: Revised    3) Patient will improve neck disability index from 18/50 to < or equal to 14 points to demonstrate mild deficits.   Baseline: 18 /50    Goal status: Revised  4) Patient to report 70% improvement in neck pain.  Baseline:  68% 11/11/22, 55% on 11/24/22 (had migraine last few days) Goal status: Revised    ASSESSMENT:  CLINICAL IMPRESSION: Patient arrived to session with report of HA this morning. Addressed pain and muscle tightness with MT which patient reported good relief with. Postural strengthening activities focused on periscapular and anti-rotation core strength. Cueing provided for proper muscle activation and posture d/t increased lumbar lordosis typical with pregnancy. Patient without increased pain at end of session.  OBJECTIVE IMPAIRMENTS: dizziness, increased muscle spasms, improper body mechanics, postural dysfunction, and pain.   PLAN:  PT FREQUENCY: 1x/week  PT DURATION: 4 weeks  PLANNED INTERVENTIONS: Therapeutic exercises, Therapeutic activity, Neuromuscular re-education, Balance training, Gait training, Patient/Family education, Self Care, Joint mobilization, Stair training, Vestibular training, Canalith repositioning, Aquatic Therapy, Dry Needling, Electrical stimulation, Cryotherapy, Moist heat, Taping, Manual therapy, and Re-evaluation  PLAN FOR NEXT SESSION: recert and possible DC; STM to cervical musculature; progress gentle postural strengthening and stabilization exercises     Anette Guarneri, PT, DPT 12/23/22 11:47 AM  [facility] at [facility] [address] [place], [place] [address] Phone # [phone] Fax # [phone]

## 2022-12-23 ENCOUNTER — Encounter: Payer: Self-pay | Admitting: Physical Therapy

## 2022-12-23 ENCOUNTER — Ambulatory Visit: Payer: Medicaid Other | Admitting: Physical Therapy

## 2022-12-23 DIAGNOSIS — M357 Hypermobility syndrome: Secondary | ICD-10-CM

## 2022-12-23 DIAGNOSIS — G8929 Other chronic pain: Secondary | ICD-10-CM

## 2022-12-23 DIAGNOSIS — M542 Cervicalgia: Secondary | ICD-10-CM

## 2022-12-23 DIAGNOSIS — R519 Headache, unspecified: Secondary | ICD-10-CM | POA: Diagnosis not present

## 2022-12-26 ENCOUNTER — Encounter (HOSPITAL_COMMUNITY): Payer: Self-pay | Admitting: Obstetrics & Gynecology

## 2022-12-26 ENCOUNTER — Inpatient Hospital Stay (HOSPITAL_COMMUNITY)
Admission: AD | Admit: 2022-12-26 | Discharge: 2022-12-26 | Disposition: A | Discharge status: Home / Self Care | Payer: Medicaid Other | Attending: Family Medicine | Admitting: Family Medicine

## 2022-12-26 DIAGNOSIS — O10913 Unspecified pre-existing hypertension complicating pregnancy, third trimester: Secondary | ICD-10-CM

## 2022-12-26 DIAGNOSIS — R030 Elevated blood-pressure reading, without diagnosis of hypertension: Secondary | ICD-10-CM | POA: Diagnosis present

## 2022-12-26 DIAGNOSIS — R519 Headache, unspecified: Secondary | ICD-10-CM | POA: Insufficient documentation

## 2022-12-26 DIAGNOSIS — Z3A28 28 weeks gestation of pregnancy: Secondary | ICD-10-CM | POA: Diagnosis not present

## 2022-12-26 DIAGNOSIS — O10013 Pre-existing essential hypertension complicating pregnancy, third trimester: Secondary | ICD-10-CM | POA: Insufficient documentation

## 2022-12-26 DIAGNOSIS — O368130 Decreased fetal movements, third trimester, not applicable or unspecified: Secondary | ICD-10-CM | POA: Diagnosis present

## 2022-12-26 DIAGNOSIS — O10919 Unspecified pre-existing hypertension complicating pregnancy, unspecified trimester: Secondary | ICD-10-CM

## 2022-12-26 LAB — PROTEIN / CREATININE RATIO, URINE
Creatinine, Urine: 42 mg/dL
Total Protein, Urine: <6 mg/dL

## 2022-12-26 LAB — CBC
HCT: 34.2 % — ABNORMAL LOW (ref 36.0–46.0)
Hemoglobin: 11.8 g/dL — ABNORMAL LOW (ref 12.0–15.0)
MCH: 32 pg (ref 26.0–34.0)
MCHC: 34.5 g/dL (ref 30.0–36.0)
MCV: 92.7 fL (ref 80.0–100.0)
Platelets: 163 10*3/uL (ref 150–400)
RBC: 3.69 MIL/uL — ABNORMAL LOW (ref 3.87–5.11)
RDW: 13.7 % (ref 11.5–15.5)
WBC: 10.8 10*3/uL — ABNORMAL HIGH (ref 4.0–10.5)
nRBC: 0 % (ref 0.0–0.2)

## 2022-12-26 LAB — COMPREHENSIVE METABOLIC PANEL
ALT: 12 U/L (ref 0–44)
AST: 13 U/L — ABNORMAL LOW (ref 15–41)
Albumin: 2.9 g/dL — ABNORMAL LOW (ref 3.5–5.0)
Alkaline Phosphatase: 46 U/L (ref 38–126)
Anion gap: 13 (ref 5–15)
BUN: 7 mg/dL (ref 6–20)
CO2: 20 mmol/L — ABNORMAL LOW (ref 22–32)
Calcium: 9.5 mg/dL (ref 8.9–10.3)
Chloride: 103 mmol/L (ref 98–111)
Creatinine, Ser: 0.59 mg/dL (ref 0.44–1.00)
GFR, Estimated: >60 mL/min (ref >60)
Glucose, Bld: 99 mg/dL (ref 70–99)
Potassium: 3.3 mmol/L — ABNORMAL LOW (ref 3.5–5.1)
Sodium: 136 mmol/L (ref 135–145)
Total Bilirubin: 0.3 mg/dL (ref 0.3–1.2)
Total Protein: 5.9 g/dL — ABNORMAL LOW (ref 6.5–8.1)

## 2022-12-26 NOTE — MAU Note (Signed)
..  [name] [name] is a 32 y.o. at 110w1d here in MAU reporting: decreased fetal movement since noon, last movement was on the way here.  Reports headache, but has a history of migraines. Denies epigastric pain or visual changes. Reports that sometimes the migraines get so bad they cause some visual changes but none now.  Denies vaginal bleeding, leaking of fluid, or contractions.   Pain score: 3/10 headache Vitals:   12/26/22 0033  BP: (!) 145/95  Pulse: 92  Resp: 16  Temp: 98.8 F (37.1 C)  SpO2: 100%     [id number] in room  Lab orders placed from triage:  UA

## 2022-12-26 NOTE — MAU Provider Note (Signed)
History     CSN: [id number]  Arrival date and time: 12/26/22 0009   Event Date/Time   First Provider Initiated Contact with Patient 12/26/22 0053      Chief Complaint  Patient presents with   Hypertension   Headache    [name] [name] is a 32 y.o. [id number] at [redacted]w[redacted]d who receives care at [facility].  Patient reports next appt is Thursday Oct 26.  She presents today for elevated blood pressures and DFM.  She endorses history of CHTN and reports taking propranolol 20mg  TID PRN.  She endorses taking 3 doses today.  She reports her bp at home was 152/93 at 2340.  She reports migraine and reports history of chronic migraine that lasts 3-5 days. She states the pain is improved with rest.  She states this is day 4 for this migraine and took 2 tylenol and 2 benadryl and 10mg  of rizatriptan and magnesium with enough relief for sleep.  However, she reports waking up with HA today and rates it a 3/10. She states that today baby has not been as active.  She states she noticed decreased movement around lunch time, but feels it is increased now. No vaginal concerns.  No abdominal cramping or contractions. Some increased urinary frequency and pressure.   OB History     Gravida  4   Para  1   Term      Preterm  1   AB  2   Living  1      SAB  2   IAB      Ectopic      Multiple      Live Births  1           Past Medical History:  Diagnosis Date   Depression    EDS (Ehlers-Danlos syndrome)    Hyperlipidemia    Migraine    Ovarian cyst    POTS (postural orthostatic tachycardia syndrome)    Preeclampsia, severe    Transaminitis    Vitamin B12 deficiency    Vitamin D deficiency     Past Surgical History:  Procedure Laterality Date   CESAREAN SECTION     EYE SURGERY     x2   WISDOM TOOTH EXTRACTION      Family History  Problem Relation Age of Onset   Hypertension Mother    Anxiety disorder Mother    Other Father        unknown   Osteoarthritis Paternal  Grandmother     Social History   Tobacco Use   Smoking status: Never   Smokeless tobacco: Never  Vaping Use   Vaping status: Never Used  Substance Use Topics   Alcohol use: Yes   Drug use: Not Currently    Allergies:  Allergies  Allergen Reactions   Amphetamine-Dextroamphetamine Other (See Comments)   Fenofibrate Other (See Comments)   Ritalin [Methylphenidate] Other (See Comments)   Tape Dermatitis    Tearing of skin   Wound Dressing Adhesive Dermatitis    Tearing of skin    Medications Prior to Admission  Medication Sig Dispense Refill Last Dose   acetaminophen (TYLENOL) 500 MG tablet Take 500 mg by mouth every 6 (six) hours as needed for headache.   12/25/2022   cetirizine (ZYRTEC) 10 MG tablet Take 10 mg by mouth daily.   12/26/2022   folic acid (FOLVITE) 1 MG tablet Take 1 mg by mouth daily.   12/26/2022   Prenatal Multivit-Min-Fe-FA (PRE-NATAL PO) Take by mouth.  12/26/2022   propranolol (INDERAL) 20 MG tablet Take 1 tablet by mouth 3 (three) times daily.   12/26/2022   sertraline (ZOLOFT) 25 MG tablet Take 50 mg by mouth daily.    12/26/2022   SUMAtriptan (IMITREX) 25 MG tablet Take 100 mg by mouth daily as needed.   Past Week   zolmitriptan (ZOMIG) 5 MG nasal solution Place 1 spray into the nose daily as needed.   12/25/2022   Azelastine-Fluticasone 137-50 MCG/ACT SUSP Place 1 spray into the nose 2 (two) times a day.   More than a month   Botulinum Toxin Type A (BOTOX) 200 units SOLR Inject as directed every 3 (three) months.   More than a month   butalbital-acetaminophen-caffeine (FIORICET) 50-325-40 MG tablet Take 1 tablet by mouth every 4 (four) hours as needed.      Cyanocobalamin 1000 MCG/ML KIT Inject 1 mL as directed every 30 (thirty) days.      fluticasone (FLONASE) 50 MCG/ACT nasal spray Place 1 spray into both nostrils daily.   More than a month   ondansetron (ZOFRAN-ODT) 8 MG disintegrating tablet Take by mouth daily.   More than a month    Review of Systems   Gastrointestinal:  Positive for diarrhea and nausea. Negative for constipation and vomiting.  Genitourinary:  Positive for frequency (Pressure). Negative for difficulty urinating, dysuria, vaginal bleeding and vaginal discharge.   Physical Exam   Blood pressure (!) 145/95, pulse 92, temperature 98.8 F (37.1 C), temperature source Oral, resp. rate 16, height 4\' 9"  (1.448 m), weight 84.5 kg, SpO2 100%, currently breastfeeding. Vitals:   12/26/22 0033 12/26/22 0045 12/26/22 0100 12/26/22 0115  BP: (!) 145/95 (!) 129/90 130/89 (!) 125/91   12/26/22 0131 12/26/22 0146 12/26/22 0200  BP: 130/89 125/84 (!) 134/93    Physical Exam Vitals reviewed.  Constitutional:      Appearance: She is well-developed.  HENT:     Head: Normocephalic and atraumatic.  Eyes:     Conjunctiva/sclera: Conjunctivae normal.  Cardiovascular:     Rate and Rhythm: Normal rate.  Pulmonary:     Effort: Pulmonary effort is normal. No respiratory distress.  Abdominal:     Comments: Gravid, appears AGA  Musculoskeletal:        General: Normal range of motion.     Cervical back: Normal range of motion.  Neurological:     Mental Status: She is alert.  Psychiatric:        Mood and Affect: Mood normal.        Behavior: Behavior normal.     Fetal Assessment 145 bpm, Mod Var, -Decels, +10x10 Accels Toco: No ctx  MAU Course   Results for orders placed or performed during the hospital encounter of 12/26/22 (from the past 24 hour(s))  Protein / creatinine ratio, urine     Status: None   Collection Time: 12/26/22  1:02 AM  Result Value Ref Range   Creatinine, Urine 42 mg/dL   Total Protein, Urine <6 mg/dL   Protein Creatinine Ratio        0.00 - 0.15 mg/mg[Cre]  CBC     Status: Abnormal   Collection Time: 12/26/22  1:13 AM  Result Value Ref Range   WBC 10.8 (H) 4.0 - 10.5 K/uL   RBC 3.69 (L) 3.87 - 5.11 MIL/uL   Hemoglobin 11.8 (L) 12.0 - 15.0 g/dL   HCT 09.8 (L) 11.9 - 14.7 %   MCV 92.7 80.0 - 100.0 fL    MCH 32.0 26.0 -  34.0 pg   MCHC 34.5 30.0 - 36.0 g/dL   RDW 16.1 09.6 - 04.5 %   Platelets 163 150 - 400 K/uL   nRBC 0.0 0.0 - 0.2 %  Comprehensive metabolic panel     Status: Abnormal   Collection Time: 12/26/22  1:13 AM  Result Value Ref Range   Sodium 136 135 - 145 mmol/L   Potassium 3.3 (L) 3.5 - 5.1 mmol/L   Chloride 103 98 - 111 mmol/L   CO2 20 (L) 22 - 32 mmol/L   Glucose, Bld 99 70 - 99 mg/dL   BUN 7 6 - 20 mg/dL   Creatinine, Ser 4.09 0.44 - 1.00 mg/dL   Calcium 9.5 8.9 - 81.1 mg/dL   Total Protein 5.9 (L) 6.5 - 8.1 g/dL   Albumin 2.9 (L) 3.5 - 5.0 g/dL   AST 13 (L) 15 - 41 U/L   ALT 12 0 - 44 U/L   Alkaline Phosphatase 46 38 - 126 U/L   Total Bilirubin 0.3 0.3 - 1.2 mg/dL   GFR, Estimated >91 >47 mL/min   Anion gap 13 5 - 15   No results found.  MDM Physical Exam Labs: CBC, CMP, PC Ratio Measure BPQ15 min EFM Assessment and Plan  32 year old [id number]  SIUP at 28.1 weeks Cat I FT Headache in Pregnancy CHTN  -[facility] PNR reviewed. -Exam performed. -Labs ordered. -Reassured NST reassuring for GA. -Patient offered and declines pain medication. -Monitor and await results.    Cherre Robins MSN, CNM 12/26/2022, 12:53 AM   Reassessment (2:08 AM) -Results as above.  -Dr. Gildardo Griffes consulted and informed of patient status, evaluation, interventions, and results. Advised: *Patient to monitor blood pressures at home and report elevations of >130/90. *Follow up with primary ob for consideration of medication modifications/increases.  -Patient informed of recommendation and without questions. -Keep next appt as scheduled. -Precautions reviewed. -Encouraged to call primary office or return to MAU if symptoms worsen or with the onset of new symptoms. -Discharged to home in stable condition.  Cherre Robins MSN, CNM Advanced Practice Provider, [facility] [employer] [facility]

## 2022-12-31 LAB — OB RESULTS CONSOLE RPR: RPR: NONREACTIVE

## 2023-01-01 ENCOUNTER — Inpatient Hospital Stay (HOSPITAL_COMMUNITY)
Admission: AD | Admit: 2023-01-01 | Discharge: 2023-01-02 | Disposition: A | Discharge status: Home / Self Care | Payer: Medicaid Other | Attending: Obstetrics and Gynecology | Admitting: Obstetrics and Gynecology

## 2023-01-01 ENCOUNTER — Encounter (HOSPITAL_COMMUNITY): Payer: Self-pay | Admitting: Obstetrics and Gynecology

## 2023-01-01 DIAGNOSIS — Z3A29 29 weeks gestation of pregnancy: Secondary | ICD-10-CM | POA: Insufficient documentation

## 2023-01-01 DIAGNOSIS — N858 Other specified noninflammatory disorders of uterus: Secondary | ICD-10-CM | POA: Diagnosis not present

## 2023-01-01 DIAGNOSIS — O99353 Diseases of the nervous system complicating pregnancy, third trimester: Secondary | ICD-10-CM | POA: Insufficient documentation

## 2023-01-01 DIAGNOSIS — R519 Headache, unspecified: Secondary | ICD-10-CM | POA: Diagnosis present

## 2023-01-01 DIAGNOSIS — O133 Gestational [pregnancy-induced] hypertension without significant proteinuria, third trimester: Secondary | ICD-10-CM | POA: Diagnosis not present

## 2023-01-01 DIAGNOSIS — Q796 Ehlers-Danlos syndrome, unspecified: Secondary | ICD-10-CM | POA: Insufficient documentation

## 2023-01-01 DIAGNOSIS — O34211 Maternal care for low transverse scar from previous cesarean delivery: Secondary | ICD-10-CM | POA: Diagnosis not present

## 2023-01-01 DIAGNOSIS — G90A Postural orthostatic tachycardia syndrome (POTS): Secondary | ICD-10-CM | POA: Insufficient documentation

## 2023-01-01 DIAGNOSIS — O99891 Other specified diseases and conditions complicating pregnancy: Secondary | ICD-10-CM | POA: Diagnosis not present

## 2023-01-01 DIAGNOSIS — G43909 Migraine, unspecified, not intractable, without status migrainosus: Secondary | ICD-10-CM | POA: Insufficient documentation

## 2023-01-01 LAB — URINALYSIS, ROUTINE W REFLEX MICROSCOPIC
Bilirubin Urine: NEGATIVE
Glucose, UA: NEGATIVE mg/dL
Hgb urine dipstick: NEGATIVE
Ketones, ur: NEGATIVE mg/dL
Leukocytes,Ua: NEGATIVE
Nitrite: NEGATIVE
Protein, ur: NEGATIVE mg/dL
Specific Gravity, Urine: 1.011 (ref 1.005–1.030)
pH: 7 (ref 5.0–8.0)

## 2023-01-01 NOTE — MAU Note (Addendum)
Pt says has had high BP 2 weeks ago. Was here last Friday- labs - all good- D/C home. Yesterday- went to office -BP was 138/70's   about BP's home  Takes meds - has med problem  ( hyper -adrenic POTS ) - BP high even when not preg.  At home- BP was 145/82  143/89  153/88 PNC- Dr Richardson Dopp Has not seen her cardiologists through preg.  - sent message to office  yesterday about home BP's  - then returned call today - and said at 3pm- BP was 149/84  124/72  123/78  112/74  133/87 Has a H/A - started last Wed - became less on Monday- Tues Then today worse- was getting Botox- ( also gets H/A when not preg ) - today did nerve block with lidocaine - gets Botox every 10-12 weeks . So now H/A is 2/10

## 2023-01-02 DIAGNOSIS — G43909 Migraine, unspecified, not intractable, without status migrainosus: Secondary | ICD-10-CM

## 2023-01-02 DIAGNOSIS — O133 Gestational [pregnancy-induced] hypertension without significant proteinuria, third trimester: Secondary | ICD-10-CM | POA: Diagnosis not present

## 2023-01-02 DIAGNOSIS — Z3A29 29 weeks gestation of pregnancy: Secondary | ICD-10-CM

## 2023-01-02 LAB — COMPREHENSIVE METABOLIC PANEL
ALT: 11 U/L (ref 0–44)
AST: 13 U/L — ABNORMAL LOW (ref 15–41)
Albumin: 2.8 g/dL — ABNORMAL LOW (ref 3.5–5.0)
Alkaline Phosphatase: 54 U/L (ref 38–126)
Anion gap: 11 (ref 5–15)
BUN: 7 mg/dL (ref 6–20)
CO2: 18 mmol/L — ABNORMAL LOW (ref 22–32)
Calcium: 8.4 mg/dL — ABNORMAL LOW (ref 8.9–10.3)
Chloride: 105 mmol/L (ref 98–111)
Creatinine, Ser: 0.59 mg/dL (ref 0.44–1.00)
GFR, Estimated: >60 mL/min (ref >60)
Glucose, Bld: 86 mg/dL (ref 70–99)
Potassium: 3.3 mmol/L — ABNORMAL LOW (ref 3.5–5.1)
Sodium: 134 mmol/L — ABNORMAL LOW (ref 135–145)
Total Bilirubin: 0.6 mg/dL (ref 0.3–1.2)
Total Protein: 5.9 g/dL — ABNORMAL LOW (ref 6.5–8.1)

## 2023-01-02 LAB — PROTEIN / CREATININE RATIO, URINE
Creatinine, Urine: 79 mg/dL
Protein Creatinine Ratio: 0.24 mg/mg{creat} — ABNORMAL HIGH (ref 0.00–0.15)
Total Protein, Urine: 19 mg/dL

## 2023-01-02 LAB — CBC
HCT: 33.7 % — ABNORMAL LOW (ref 36.0–46.0)
Hemoglobin: 11.4 g/dL — ABNORMAL LOW (ref 12.0–15.0)
MCH: 30.9 pg (ref 26.0–34.0)
MCHC: 33.8 g/dL (ref 30.0–36.0)
MCV: 91.3 fL (ref 80.0–100.0)
Platelets: 140 10*3/uL — ABNORMAL LOW (ref 150–400)
RBC: 3.69 MIL/uL — ABNORMAL LOW (ref 3.87–5.11)
RDW: 13.7 % (ref 11.5–15.5)
WBC: 10.1 10*3/uL (ref 4.0–10.5)
nRBC: 0 % (ref 0.0–0.2)

## 2023-01-02 LAB — FETAL FIBRONECTIN: Fetal Fibronectin: NEGATIVE

## 2023-01-02 MED ORDER — CYCLOBENZAPRINE HCL 5 MG PO TABS: 5.0000 mg | ORAL_TABLET | Freq: Three times a day (TID) | ORAL | 0 refills | Status: DC | PRN

## 2023-01-02 MED ORDER — METOCLOPRAMIDE HCL 5 MG/ML IJ SOLN: 10.0000 mg | Freq: Once | INTRAMUSCULAR | Status: DC

## 2023-01-02 MED ORDER — METOCLOPRAMIDE HCL 10 MG PO TABS
10.0000 mg | ORAL_TABLET | Freq: Once | ORAL | Status: AC
  Administered 2023-01-02: 10 mg via ORAL
  Filled 2023-01-02: qty 1

## 2023-01-02 MED ORDER — ACETAMINOPHEN 500 MG PO TABS
1000.0000 mg | ORAL_TABLET | Freq: Once | ORAL | Status: AC
  Administered 2023-01-02: 1000 mg via ORAL
  Filled 2023-01-02: qty 2

## 2023-01-02 MED ORDER — METOCLOPRAMIDE HCL 10 MG PO TABS: 10.0000 mg | ORAL_TABLET | Freq: Four times a day (QID) | ORAL | 0 refills | Status: DC | PRN

## 2023-01-02 NOTE — MAU Provider Note (Signed)
Chief Complaint:  Hypertension   Event Date/Time   First Provider Initiated Contact with Patient 01/02/23 0038      HPI: [name] [name] is a 32 y.o. [id number] at [redacted]w[redacted]d who presents to maternity admissions reporting high blood pressure readings at home and migraine h/a. She has hx HTN prior to pregnancy and PEC in previous pregnancy but normal blood pressures so far in this pregnancy. She has hx migraine h/a and this feels similar, with pain on left side of her head and associated photophobia.  She has lidocaine injections for h/a regularly.  She denies any visual changes or epigastric pain.  She is feeling abdominal tightening that is not painful, but has been more intense and more frequent in the last 3 days. She reports good fetal movement.  Past Medical History: Past Medical History:  Diagnosis Date   Depression    EDS (Ehlers-Danlos syndrome)    Hyperlipidemia    Migraine    Ovarian cyst    POTS (postural orthostatic tachycardia syndrome)    Preeclampsia, severe    Transaminitis    Vitamin B12 deficiency    Vitamin D deficiency     Past obstetric history: OB History  Gravida Para Term Preterm AB Living  4 1   1 2 1   SAB IAB Ectopic Multiple Live Births  2       1    # Outcome Date GA Lbr Len/2nd Weight Sex Type Anes PTL Lv  4 Current           3 SAB 01/2022 [redacted]w[redacted]d         2 SAB 10/2021          1 Preterm 01/2020 [redacted]w[redacted]d    CS-LTranv   LIV    Past Surgical History: Past Surgical History:  Procedure Laterality Date   CESAREAN SECTION     EYE SURGERY     x2   WISDOM TOOTH EXTRACTION      Family History: Family History  Problem Relation Age of Onset   Hypertension Mother    Anxiety disorder Mother    Other Father        unknown   Osteoarthritis Paternal Grandmother     Social History: Social History   Tobacco Use   Smoking status: Never   Smokeless tobacco: Never  Vaping Use   Vaping status: Never Used  Substance Use Topics   Alcohol use: Not Currently    Drug use: Not Currently    Allergies:  Allergies  Allergen Reactions   Amphetamine-Dextroamphetamine Other (See Comments)   Fenofibrate Other (See Comments)   Ritalin [Methylphenidate] Other (See Comments)   Tape Dermatitis    Tearing of skin   Wound Dressing Adhesive Dermatitis    Tearing of skin    Meds:  Medications Prior to Admission  Medication Sig Dispense Refill Last Dose   acetaminophen (TYLENOL) 500 MG tablet Take 500 mg by mouth every 6 (six) hours as needed for headache.   12/31/2022   Azelastine-Fluticasone 137-50 MCG/ACT SUSP Place 1 spray into the nose 2 (two) times a day.   01/01/2023   Botulinum Toxin Type A (BOTOX) 200 units SOLR Inject as directed every 3 (three) months.   01/01/2023   folic acid (FOLVITE) 1 MG tablet Take 1 mg by mouth daily.   01/01/2023   ondansetron (ZOFRAN-ODT) 8 MG disintegrating tablet Take by mouth daily.   12/31/2022   Prenatal Multivit-Min-Fe-FA (PRE-NATAL PO) Take by mouth.   01/01/2023   propranolol (INDERAL) 20  MG tablet Take 1 tablet by mouth 3 (three) times daily.   01/01/2023   sertraline (ZOLOFT) 25 MG tablet Take 50 mg by mouth daily.    01/01/2023   cetirizine (ZYRTEC) 10 MG tablet Take 10 mg by mouth daily.      Cyanocobalamin 1000 MCG/ML KIT Inject 1 mL as directed every 30 (thirty) days.      fluticasone (FLONASE) 50 MCG/ACT nasal spray Place 1 spray into both nostrils daily.      SUMAtriptan (IMITREX) 25 MG tablet Take 100 mg by mouth daily as needed.      zolmitriptan (ZOMIG) 5 MG nasal solution Place 1 spray into the nose daily as needed.       ROS:  Review of Systems  Constitutional:  Negative for chills, fatigue and fever.  Eyes:  Negative for visual disturbance.  Respiratory:  Negative for shortness of breath.   Cardiovascular:  Negative for chest pain.  Gastrointestinal:  Negative for abdominal pain, nausea and vomiting.  Genitourinary:  Negative for difficulty urinating, dysuria, flank pain, pelvic pain, vaginal  bleeding, vaginal discharge and vaginal pain.  Neurological:  Negative for dizziness and headaches.  Psychiatric/Behavioral: Negative.       I have reviewed patient's Past Medical Hx, Surgical Hx, Family Hx, Social Hx, medications and allergies.   Physical Exam  Patient Vitals for the past 24 hrs:  BP Temp Temp src Pulse Resp SpO2 Height Weight  01/02/23 0030 (!) 141/90 -- -- 91 -- 97 % -- --  01/02/23 0015 138/89 -- -- 80 -- 99 % -- --  01/02/23 0000 (!) 131/91 -- -- 80 -- 98 % -- --  01/01/23 2345 139/87 -- -- 75 -- 99 % -- --  01/01/23 2330 (!) 144/88 -- -- 77 -- 100 % -- --  01/01/23 2121 (!) 141/95 98 F (36.7 C) Oral 87 16 -- 4\' 9"  (1.448 m) 85.5 kg   Constitutional: Well-developed, well-nourished female in no acute distress.  Cardiovascular: normal rate Respiratory: normal effort GI: Abd soft, non-tender, gravid appropriate for gestational age.  MS: Extremities nontender, no edema, normal ROM Neurologic: Alert and oriented x 4.  GU: Neg CVAT.  PELVIC EXAM:  Dilation: Closed Effacement (%): Thick Exam by:: Sharen Counter, CNM  FHT:  Baseline 140 , moderate variability, accelerations present, no decelerations Contractions: q 2-4 mins, moderate to palpation   Labs: Results for orders placed or performed during the hospital encounter of 01/01/23 (from the past 24 hour(s))  Urinalysis, Routine w reflex microscopic -Urine, Clean Catch     Status: None   Collection Time: 01/01/23 11:19 PM  Result Value Ref Range   Color, Urine YELLOW YELLOW   APPearance CLEAR CLEAR   Specific Gravity, Urine 1.011 1.005 - 1.030   pH 7.0 5.0 - 8.0   Glucose, UA NEGATIVE NEGATIVE mg/dL   Hgb urine dipstick NEGATIVE NEGATIVE   Bilirubin Urine NEGATIVE NEGATIVE   Ketones, ur NEGATIVE NEGATIVE mg/dL   Protein, ur NEGATIVE NEGATIVE mg/dL   Nitrite NEGATIVE NEGATIVE   Leukocytes,Ua NEGATIVE NEGATIVE  Protein / creatinine ratio, urine     Status: Abnormal   Collection Time: 01/01/23  11:19 PM  Result Value Ref Range   Creatinine, Urine 79 mg/dL   Total Protein, Urine 19 mg/dL   Protein Creatinine Ratio 0.24 (H) 0.00 - 0.15 mg/mg[Cre]  Fetal fibronectin     Status: None   Collection Time: 01/02/23  1:11 AM  Result Value Ref Range   Fetal Fibronectin NEGATIVE NEGATIVE  CBC     Status: Abnormal   Collection Time: 01/02/23  1:29 AM  Result Value Ref Range   WBC 10.1 4.0 - 10.5 K/uL   RBC 3.69 (L) 3.87 - 5.11 MIL/uL   Hemoglobin 11.4 (L) 12.0 - 15.0 g/dL   HCT 16.1 (L) 09.6 - 04.5 %   MCV 91.3 80.0 - 100.0 fL   MCH 30.9 26.0 - 34.0 pg   MCHC 33.8 30.0 - 36.0 g/dL   RDW 40.9 81.1 - 91.4 %   Platelets 140 (L) 150 - 400 K/uL   nRBC 0.0 0.0 - 0.2 %  Comprehensive metabolic panel     Status: Abnormal   Collection Time: 01/02/23  1:29 AM  Result Value Ref Range   Sodium 134 (L) 135 - 145 mmol/L   Potassium 3.3 (L) 3.5 - 5.1 mmol/L   Chloride 105 98 - 111 mmol/L   CO2 18 (L) 22 - 32 mmol/L   Glucose, Bld 86 70 - 99 mg/dL   BUN 7 6 - 20 mg/dL   Creatinine, Ser 7.82 0.44 - 1.00 mg/dL   Calcium 8.4 (L) 8.9 - 10.3 mg/dL   Total Protein 5.9 (L) 6.5 - 8.1 g/dL   Albumin 2.8 (L) 3.5 - 5.0 g/dL   AST 13 (L) 15 - 41 U/L   ALT 11 0 - 44 U/L   Alkaline Phosphatase 54 38 - 126 U/L   Total Bilirubin 0.6 0.3 - 1.2 mg/dL   GFR, Estimated >95 >62 mL/min   Anion gap 11 5 - 15      Imaging:  No results found.  MAU Course/MDM: Orders Placed This Encounter  Procedures   Urinalysis, Routine w reflex microscopic -Urine, Clean Catch   CBC   Comprehensive metabolic panel   Protein / creatinine ratio, urine   Fetal fibronectin   Discharge patient    Meds ordered this encounter  Medications   DISCONTD: metoCLOPramide (REGLAN) injection 10 mg   acetaminophen (TYLENOL) tablet 1,000 mg   metoCLOPramide (REGLAN) tablet 10 mg   cyclobenzaprine (FLEXERIL) 5 MG tablet    Sig: Take 1-2 tablets (5-10 mg total) by mouth 3 (three) times daily as needed for muscle spasms.     Dispense:  30 tablet    Refill:  0   metoCLOPramide (REGLAN) 10 MG tablet    Sig: Take 1 tablet (10 mg total) by mouth every 6 (six) hours as needed for nausea.    Dispense:  30 tablet    Refill:  0     NST reviewed and appropriate for gestational age Cervix closed/thick/high, FFN negative, no evidence of PTL Pt driving, so Tylenol and Reglan given for h/a in MAU H/A down from 8 to 6/10, pt able to rest [facility] labs wnl Consult Dr Despina Hidden with presentation, exam findings and test results.  GHTN vs CHTN, but pt normotensive on propranolol for POTS until now BP check in office in 2-3 days PEC s/sx, reasons to seek care reviewed  Assessment: 1. Gestational hypertension, third trimester   2. [redacted] weeks gestation of pregnancy   3. Migraine without status migrainosus, not intractable, unspecified migraine type     Plan: Discharge home Labor precautions and fetal kick counts  Follow-up Information     [facility] Follow up in 3 day(s).   Specialty: Obstetrics and Gynecology Contact information: [address] [place] [address] [phone]        [facility] Follow up.  Specialty: Obstetrics and Gynecology Why: As needed for emergencies Contact information: [address] [place] [address] [phone]               Allergies as of 01/02/2023       Reactions   Amphetamine-dextroamphetamine Other (See Comments)   Fenofibrate Other (See Comments)   Ritalin [methylphenidate] Other (See Comments)   Tape Dermatitis   Tearing of skin   Wound Dressing Adhesive Dermatitis   Tearing of skin        Medication List     TAKE these medications    acetaminophen 500 MG tablet Commonly known as: TYLENOL Take 500 mg by mouth every 6 (six) hours as needed for headache.   Azelastine-Fluticasone 137-50 MCG/ACT Susp Place 1 spray into the nose 2 (two) times a day.    Botox 200 units injection Generic drug: botulinum toxin Type A Inject as directed every 3 (three) months.   cetirizine 10 MG tablet Commonly known as: ZYRTEC Take 10 mg by mouth daily.   Cyanocobalamin 1000 MCG/ML Kit Inject 1 mL as directed every 30 (thirty) days.   cyclobenzaprine 5 MG tablet Commonly known as: FLEXERIL Take 1-2 tablets (5-10 mg total) by mouth 3 (three) times daily as needed for muscle spasms.   fluticasone 50 MCG/ACT nasal spray Commonly known as: FLONASE Place 1 spray into both nostrils daily.   folic acid 1 MG tablet Commonly known as: FOLVITE Take 1 mg by mouth daily.   metoCLOPramide 10 MG tablet Commonly known as: Reglan Take 1 tablet (10 mg total) by mouth every 6 (six) hours as needed for nausea.   ondansetron 8 MG disintegrating tablet Commonly known as: ZOFRAN-ODT Take by mouth daily.   PRE-NATAL PO Take by mouth.   propranolol 20 MG tablet Commonly known as: INDERAL Take 1 tablet by mouth 3 (three) times daily.   sertraline 25 MG tablet Commonly known as: ZOLOFT Take 50 mg by mouth daily.   SUMAtriptan 25 MG tablet Commonly known as: IMITREX Take 100 mg by mouth daily as needed.   Zomig 5 MG nasal solution Generic drug: zolmitriptan Place 1 spray into the nose daily as needed.        Sharen Counter Certified Nurse-Midwife 01/02/2023 2:44 AM

## 2023-01-05 ENCOUNTER — Inpatient Hospital Stay (HOSPITAL_COMMUNITY)
Admission: AD | Admit: 2023-01-05 | Discharge: 2023-01-06 | Disposition: A | Discharge status: Home / Self Care | Payer: Medicaid Other | Attending: Obstetrics and Gynecology | Admitting: Obstetrics and Gynecology

## 2023-01-05 DIAGNOSIS — O26893 Other specified pregnancy related conditions, third trimester: Secondary | ICD-10-CM | POA: Diagnosis present

## 2023-01-05 DIAGNOSIS — Z3A29 29 weeks gestation of pregnancy: Secondary | ICD-10-CM | POA: Diagnosis not present

## 2023-01-05 DIAGNOSIS — O4703 False labor before 37 completed weeks of gestation, third trimester: Secondary | ICD-10-CM | POA: Insufficient documentation

## 2023-01-05 DIAGNOSIS — O09293 Supervision of pregnancy with other poor reproductive or obstetric history, third trimester: Secondary | ICD-10-CM | POA: Diagnosis not present

## 2023-01-05 DIAGNOSIS — O10913 Unspecified pre-existing hypertension complicating pregnancy, third trimester: Secondary | ICD-10-CM | POA: Insufficient documentation

## 2023-01-05 DIAGNOSIS — O479 False labor, unspecified: Secondary | ICD-10-CM

## 2023-01-05 DIAGNOSIS — R109 Unspecified abdominal pain: Secondary | ICD-10-CM | POA: Insufficient documentation

## 2023-01-06 ENCOUNTER — Ambulatory Visit: Payer: Medicaid Other | Admitting: Physical Therapy

## 2023-01-06 DIAGNOSIS — Z3A29 29 weeks gestation of pregnancy: Secondary | ICD-10-CM

## 2023-01-06 DIAGNOSIS — O4703 False labor before 37 completed weeks of gestation, third trimester: Secondary | ICD-10-CM | POA: Diagnosis not present

## 2023-01-06 LAB — URINALYSIS, ROUTINE W REFLEX MICROSCOPIC
Bilirubin Urine: NEGATIVE
Glucose, UA: NEGATIVE mg/dL
Hgb urine dipstick: NEGATIVE
Ketones, ur: NEGATIVE mg/dL
Leukocytes,Ua: NEGATIVE
Nitrite: NEGATIVE
Protein, ur: NEGATIVE mg/dL
Specific Gravity, Urine: 1.002 — ABNORMAL LOW (ref 1.005–1.030)
pH: 7 (ref 5.0–8.0)

## 2023-01-06 MED ORDER — NIFEDIPINE 10 MG PO CAPS
10.0000 mg | ORAL_CAPSULE | ORAL | Status: DC | PRN
  Administered 2023-01-06: 10 mg via ORAL
  Filled 2023-01-06: qty 1

## 2023-01-06 NOTE — MAU Note (Signed)
.  [name] [name] is a 32 y.o. at [redacted]w[redacted]d here in MAU reporting ctxs since 2100. They have gotten closer. Ctxs are not really painful but just uncomfortable. Pt was here [place] having ctxs. Denies VB or LOF. Reports good FM  Onset of complaint: 2100 Pain score: 2 Vitals:   01/06/23 0015 01/06/23 0016  BP:  125/81  Pulse: 83   Resp: 18   Temp: 98.2 F (36.8 C)   SpO2: 99%      FHT:155 Lab orders placed from triage:  u/a

## 2023-01-06 NOTE — MAU Provider Note (Signed)
Chief Complaint:  Contractions   Event Date/Time   First Provider Initiated Contact with Patient 01/06/23 0024     HPI: [name] [name] is a 32 y.o. [phone] at 17w5dwho presents to maternity admissions reporting contractions since 2100hrs. States they are not painful but a bit uncomfortable.  Was seen on 01/02/23 for HTN and was also contracting that day.  Her cervix was long and closed and Fetal Fibronectin was negative.  Was not treated for that at that time. . She reports good fetal movement, denies LOF, vaginal bleeding, urinary symptoms, h/a, or fever/chills.  Has a history of Chronic HTN and POTS.  Is on Propranolol for that.   Abdominal Pain This is a recurrent problem. The problem occurs intermittently. The quality of the pain is cramping. The abdominal pain does not radiate. Pertinent negatives include no constipation, diarrhea, dysuria, fever or frequency. Nothing aggravates the pain. The pain is relieved by Nothing. She has tried nothing for the symptoms.   RN Note: [name] [name] is a 32 y.o. at [redacted]w[redacted]d here in MAU reporting ctxs since 2100. They have gotten closer. Ctxs are not really painful but just uncomfortable. Pt was here [place] having ctxs. Denies VB or LOF. Reports good FM  Onset of complaint: 2100                          Pain score: 2  Past Medical History: Past Medical History:  Diagnosis Date   Depression    EDS (Ehlers-Danlos syndrome)    Hyperlipidemia    Migraine    Ovarian cyst    POTS (postural orthostatic tachycardia syndrome)    Preeclampsia, severe    Transaminitis    Vitamin B12 deficiency    Vitamin D deficiency     Past obstetric history: OB History  Gravida Para Term Preterm AB Living  4 1   1 2 1   SAB IAB Ectopic Multiple Live Births  2       1    # Outcome Date GA Lbr Len/2nd Weight Sex Type Anes PTL Lv  4 Current           3 SAB 01/2022 [redacted]w[redacted]d         2 SAB 10/2021          1 Preterm 01/2020 [redacted]w[redacted]d    CS-LTranv   LIV    Past Surgical  History: Past Surgical History:  Procedure Laterality Date   CESAREAN SECTION     EYE SURGERY     x2   WISDOM TOOTH EXTRACTION      Family History: Family History  Problem Relation Age of Onset   Hypertension Mother    Anxiety disorder Mother    Other Father        unknown   Osteoarthritis Paternal Grandmother     Social History: Social History   Tobacco Use   Smoking status: Never   Smokeless tobacco: Never  Vaping Use   Vaping status: Never Used  Substance Use Topics   Alcohol use: Not Currently   Drug use: Not Currently    Allergies:  Allergies  Allergen Reactions   Amphetamine-Dextroamphetamine Other (See Comments)   Fenofibrate Other (See Comments)   Ritalin [Methylphenidate] Other (See Comments)   Tape Dermatitis    Tearing of skin   Wound Dressing Adhesive Dermatitis    Tearing of skin    Meds:  Medications Prior to Admission  Medication Sig Dispense Refill Last Dose   acetaminophen (  TYLENOL) 500 MG tablet Take 500 mg by mouth every 6 (six) hours as needed for headache.      Azelastine-Fluticasone 137-50 MCG/ACT SUSP Place 1 spray into the nose 2 (two) times a day.      Botulinum Toxin Type A (BOTOX) 200 units SOLR Inject as directed every 3 (three) months.      cetirizine (ZYRTEC) 10 MG tablet Take 10 mg by mouth daily.      Cyanocobalamin 1000 MCG/ML KIT Inject 1 mL as directed every 30 (thirty) days.      cyclobenzaprine (FLEXERIL) 5 MG tablet Take 1-2 tablets (5-10 mg total) by mouth 3 (three) times daily as needed for muscle spasms. 30 tablet 0    fluticasone (FLONASE) 50 MCG/ACT nasal spray Place 1 spray into both nostrils daily.      folic acid (FOLVITE) 1 MG tablet Take 1 mg by mouth daily.      metoCLOPramide (REGLAN) 10 MG tablet Take 1 tablet (10 mg total) by mouth every 6 (six) hours as needed for nausea. 30 tablet 0    ondansetron (ZOFRAN-ODT) 8 MG disintegrating tablet Take by mouth daily.      Prenatal Multivit-Min-Fe-FA (PRE-NATAL PO)  Take by mouth.      propranolol (INDERAL) 20 MG tablet Take 1 tablet by mouth 3 (three) times daily.      sertraline (ZOLOFT) 25 MG tablet Take 50 mg by mouth daily.       SUMAtriptan (IMITREX) 25 MG tablet Take 100 mg by mouth daily as needed.      zolmitriptan (ZOMIG) 5 MG nasal solution Place 1 spray into the nose daily as needed.       I have reviewed patient's Past Medical Hx, Surgical Hx, Family Hx, Social Hx, medications and allergies.   ROS:  Review of Systems  Constitutional:  Negative for fever.  Gastrointestinal:  Positive for abdominal pain. Negative for constipation and diarrhea.  Genitourinary:  Negative for dysuria and frequency.   Other systems negative  Physical Exam  Patient Vitals for the past 24 hrs:  BP Temp Pulse Resp SpO2 Height Weight  01/06/23 0016 125/81 -- -- -- -- -- --  01/06/23 0015 -- 98.2 F (36.8 C) 83 18 99 % 4\' 9"  (1.448 m) 84.4 kg   Constitutional: Well-developed, well-nourished female in no acute distress.  Cardiovascular: normal rate and rhythm Respiratory: normal effort, clear to auscultation bilaterally GI: Abd soft, non-tender, gravid appropriate for gestational age.   No rebound or guarding. MS: Extremities nontender, no edema, normal ROM Neurologic: Alert and oriented x 4.  GU: Neg CVAT.  PELVIC EXAM: Dilation: Closed Effacement (%): Thick Station: Ballotable Exam by:: Wynelle Bourgeois, CNM    FHT:  Baseline 140 , moderate variability, accelerations present, no decelerations Contractions: q 4-6 mins Irregular     Labs: Results for orders placed or performed during the hospital encounter of 01/05/23 (from the past 24 hour(s))  Urinalysis, Routine w reflex microscopic -Urine, Clean Catch     Status: Abnormal   Collection Time: 01/05/23 11:51 PM  Result Value Ref Range   Color, Urine STRAW (A) YELLOW   APPearance CLEAR CLEAR   Specific Gravity, Urine 1.002 (L) 1.005 - 1.030   pH 7.0 5.0 - 8.0   Glucose, UA NEGATIVE NEGATIVE mg/dL    Hgb urine dipstick NEGATIVE NEGATIVE   Bilirubin Urine NEGATIVE NEGATIVE   Ketones, ur NEGATIVE NEGATIVE mg/dL   Protein, ur NEGATIVE NEGATIVE mg/dL   Nitrite NEGATIVE NEGATIVE   Leukocytes,Ua  NEGATIVE NEGATIVE    Fetal fibronectin Negative on prior visit  Imaging:  No results found.  MAU Course/MDM: I have reviewed the triage vital signs and the nursing notes.   Pertinent labs & imaging results that were available during my care of the patient were reviewed by me and considered in my medical decision making (see chart for details).      I have reviewed her medical records including past results, notes and treatments.   I have ordered labs and reviewed results. UA is negative, FFn negative NST reviewed Consult Dr Jolayne Panther with presentation, exam findings and test results.  She states using a single dose of Procardia is sufficient, given neg FFn and closed cervix. Treatments in MAU included Procardia x 1 dose which did space UCs out somewhat..    Assessment: Single IUP at [redacted]w[redacted]d Threatened preterm labor No change in cervix, negative Fetal Fibronectin  Plan: Discharge home Preterm Labor precautions and fetal kick counts Follow up in Office for prenatal visits and recheck Encouraged to return if she develops worsening of symptoms, increase in pain, fever, or other concerning symptoms.   Pt stable at time of discharge.  Wynelle Bourgeois CNM, MSN Certified Nurse-Midwife 01/06/2023 12:24 AM

## 2023-01-08 ENCOUNTER — Inpatient Hospital Stay (HOSPITAL_COMMUNITY)
Admission: AD | Admit: 2023-01-08 | Discharge: 2023-01-08 | Disposition: A | Discharge status: Home / Self Care | Payer: Medicaid Other | Attending: Obstetrics and Gynecology | Admitting: Obstetrics and Gynecology

## 2023-01-08 ENCOUNTER — Encounter (HOSPITAL_COMMUNITY): Payer: Self-pay | Admitting: Obstetrics and Gynecology

## 2023-01-08 DIAGNOSIS — Z3A30 30 weeks gestation of pregnancy: Secondary | ICD-10-CM | POA: Insufficient documentation

## 2023-01-08 DIAGNOSIS — O10913 Unspecified pre-existing hypertension complicating pregnancy, third trimester: Secondary | ICD-10-CM | POA: Insufficient documentation

## 2023-01-08 DIAGNOSIS — O4703 False labor before 37 completed weeks of gestation, third trimester: Secondary | ICD-10-CM | POA: Insufficient documentation

## 2023-01-08 DIAGNOSIS — E876 Hypokalemia: Secondary | ICD-10-CM | POA: Diagnosis not present

## 2023-01-08 DIAGNOSIS — O10919 Unspecified pre-existing hypertension complicating pregnancy, unspecified trimester: Secondary | ICD-10-CM

## 2023-01-08 DIAGNOSIS — O99283 Endocrine, nutritional and metabolic diseases complicating pregnancy, third trimester: Secondary | ICD-10-CM | POA: Insufficient documentation

## 2023-01-08 LAB — COMPREHENSIVE METABOLIC PANEL
ALT: 12 U/L (ref 0–44)
AST: 13 U/L — ABNORMAL LOW (ref 15–41)
Albumin: 2.8 g/dL — ABNORMAL LOW (ref 3.5–5.0)
Alkaline Phosphatase: 58 U/L (ref 38–126)
Anion gap: 9 (ref 5–15)
BUN: 6 mg/dL (ref 6–20)
CO2: 20 mmol/L — ABNORMAL LOW (ref 22–32)
Calcium: 8.9 mg/dL (ref 8.9–10.3)
Chloride: 105 mmol/L (ref 98–111)
Creatinine, Ser: 0.57 mg/dL (ref 0.44–1.00)
GFR, Estimated: >60 mL/min (ref >60)
Glucose, Bld: 93 mg/dL (ref 70–99)
Potassium: 3.1 mmol/L — ABNORMAL LOW (ref 3.5–5.1)
Sodium: 134 mmol/L — ABNORMAL LOW (ref 135–145)
Total Bilirubin: 0.3 mg/dL (ref 0.3–1.2)
Total Protein: 5.9 g/dL — ABNORMAL LOW (ref 6.5–8.1)

## 2023-01-08 LAB — PROTEIN / CREATININE RATIO, URINE
Creatinine, Urine: 39 mg/dL
Protein Creatinine Ratio: 0.21 mg/mg{creat} — ABNORMAL HIGH (ref 0.00–0.15)
Total Protein, Urine: 8 mg/dL

## 2023-01-08 LAB — URINALYSIS, ROUTINE W REFLEX MICROSCOPIC
Bilirubin Urine: NEGATIVE
Glucose, UA: NEGATIVE mg/dL
Hgb urine dipstick: NEGATIVE
Ketones, ur: NEGATIVE mg/dL
Leukocytes,Ua: NEGATIVE
Nitrite: NEGATIVE
Protein, ur: NEGATIVE mg/dL
Specific Gravity, Urine: 1.005 (ref 1.005–1.030)
pH: 7 (ref 5.0–8.0)

## 2023-01-08 LAB — CBC
HCT: 33.7 % — ABNORMAL LOW (ref 36.0–46.0)
Hemoglobin: 11.7 g/dL — ABNORMAL LOW (ref 12.0–15.0)
MCH: 31.7 pg (ref 26.0–34.0)
MCHC: 34.7 g/dL (ref 30.0–36.0)
MCV: 91.3 fL (ref 80.0–100.0)
Platelets: 146 10*3/uL — ABNORMAL LOW (ref 150–400)
RBC: 3.69 MIL/uL — ABNORMAL LOW (ref 3.87–5.11)
RDW: 13.8 % (ref 11.5–15.5)
WBC: 9.8 10*3/uL (ref 4.0–10.5)
nRBC: 0 % (ref 0.0–0.2)

## 2023-01-08 LAB — WET PREP, GENITAL
Clue Cells Wet Prep HPF POC: NONE SEEN
Sperm: NONE SEEN
Trich, Wet Prep: NONE SEEN
WBC, Wet Prep HPF POC: >=10 — AB (ref <10)
Yeast Wet Prep HPF POC: NONE SEEN

## 2023-01-08 MED ORDER — POTASSIUM CHLORIDE CRYS ER 20 MEQ PO TBCR
40.0000 meq | EXTENDED_RELEASE_TABLET | Freq: Once | ORAL | Status: AC
  Administered 2023-01-08: 40 meq via ORAL
  Filled 2023-01-08: qty 2

## 2023-01-08 MED ORDER — NIFEDIPINE ER OSMOTIC RELEASE 30 MG PO TB24: 30.0000 mg | ORAL_TABLET | Freq: Every day | ORAL | 1 refills | Status: DC

## 2023-01-08 MED ORDER — NIFEDIPINE 10 MG PO CAPS
10.0000 mg | ORAL_CAPSULE | ORAL | Status: DC | PRN
  Administered 2023-01-08 (×3): 10 mg via ORAL
  Filled 2023-01-08 (×3): qty 1

## 2023-01-08 NOTE — MAU Note (Signed)
.  Tama Grosz is a 32 y.o. at [redacted]w[redacted]d here in MAU reporting: she has been having ctx on-going for the past week. She was seen in MAU for ctx on 10/2 and was closed/thick/ballotable and her Fetalfibronectin was negative. She reports that the procardia slowed her ctx down, but did not completely take them away. Last IC about a week ago.   PIH Assessment: Headache present: Yes ;No treatment attempted Visual disturbances: None RUQ pain/Epigastric: None Atypical edema: ; ongoing/no abrupt changes Hx of HBP: Hx of Pre-E in previous pregnancy BP Medications: None prescribed   Denies VB or LOF. Reports good FM. Previous C/S, scheduled for repeat on 12/3. Sees [facility] for POTS and BP.   Onset of complaint: On-going Pain score: 2/10 ctx, HA 3/10 Vitals:   01/08/23 1151 01/08/23 1201  BP:  134/87  Pulse:  88  Resp:  18  Temp: 98.2 F (36.8 C) 98.2 F (36.8 C)  SpO2:  97%     FHT: 145 Lab orders placed from triage:  UA

## 2023-01-08 NOTE — MAU Provider Note (Signed)
Chief Complaint: Contractions  Provider assessed 1217    SUBJECTIVE HPI: [name] [name] is a 32 y.o. [id number] at 100w0d by LMP who presents to maternity admissions reporting cramping/contractions since last night.  Patient has had multiple episodes of cramping/mild contractions. Has been seen in MAU for same on 9/28 and 10/2. Had negative FFN at those visits. Cramping improved with procardia but does not have any at home. Notes staying very hydrated 2/2 her POTS. Started to have more cramping/contractions this AM. Mild discharge. Denies VB, gush of fluid, urinary symptoms, HA, RUQ pain, extremity swelling, vision changes. +FM.  HPI  Past Medical History:  Diagnosis Date   Depression    EDS (Ehlers-Danlos syndrome)    Hyperlipidemia    Migraine    Ovarian cyst    POTS (postural orthostatic tachycardia syndrome)    POTS (postural orthostatic tachycardia syndrome)    Preeclampsia, severe    Transaminitis    Vitamin B12 deficiency    Vitamin D deficiency    Past Surgical History:  Procedure Laterality Date   CESAREAN SECTION     EYE SURGERY     x2   WISDOM TOOTH EXTRACTION     Social History   Socioeconomic History   Marital status: Single    Spouse name: Not on file   Number of children: Not on file   Years of education: Not on file   Highest education level: Not on file  Occupational History   Not on file  Tobacco Use   Smoking status: Never   Smokeless tobacco: Never  Vaping Use   Vaping status: Never Used  Substance and Sexual Activity   Alcohol use: Not Currently   Drug use: Not Currently   Sexual activity: Yes    Birth control/protection: None  Other Topics Concern   Not on file  Social History Narrative   Not on file   Social Determinants of Health   Financial Resource Strain: Not on file  Food Insecurity: No Food Insecurity (02/02/2020)   Received from [facility] visits prior to 06/06/2022.   Hunger Vital Sign    Worried About  Running Out of Food in the Last Year: Never true    Ran Out of Food in the Last Year: Never true  Transportation Needs: Not on file  Physical Activity: Not on file  Stress: Not on file  Social Connections: Not on file  Intimate Partner Violence: Not At Risk (02/02/2020)   Received from [facility] visits prior to 06/06/2022.   Humiliation, Afraid, Rape, and Kick questionnaire    Fear of Current or Ex-Partner: No    Emotionally Abused: No    Physically Abused: No    Sexually Abused: No   No current facility-administered medications on file prior to encounter.   Current Outpatient Medications on File Prior to Encounter  Medication Sig Dispense Refill   cetirizine (ZYRTEC) 10 MG tablet Take 10 mg by mouth daily.     fluticasone (FLONASE) 50 MCG/ACT nasal spray Place 1 spray into both nostrils daily.     folic acid (FOLVITE) 1 MG tablet Take 1 mg by mouth daily.     metoCLOPramide (REGLAN) 10 MG tablet Take 1 tablet (10 mg total) by mouth every 6 (six) hours as needed for nausea. 30 tablet 0   ondansetron (ZOFRAN-ODT) 8 MG disintegrating tablet Take by mouth daily.     Prenatal Multivit-Min-Fe-FA (PRE-NATAL PO) Take by mouth.     propranolol (INDERAL) 20 MG  tablet Take 1 tablet by mouth 3 (three) times daily.     sertraline (ZOLOFT) 25 MG tablet Take 50 mg by mouth daily.      SUMAtriptan (IMITREX) 25 MG tablet Take 100 mg by mouth daily as needed.     zolmitriptan (ZOMIG) 5 MG nasal solution Place 1 spray into the nose daily as needed.     acetaminophen (TYLENOL) 500 MG tablet Take 500 mg by mouth every 6 (six) hours as needed for headache.     Azelastine-Fluticasone 137-50 MCG/ACT SUSP Place 1 spray into the nose 2 (two) times a day.     Botulinum Toxin Type A (BOTOX) 200 units SOLR Inject as directed every 3 (three) months.     Cyanocobalamin 1000 MCG/ML KIT Inject 1 mL as directed every 30 (thirty) days.     cyclobenzaprine (FLEXERIL) 5 MG tablet Take 1-2 tablets  (5-10 mg total) by mouth 3 (three) times daily as needed for muscle spasms. (Patient not taking: Reported on 01/08/2023) 30 tablet 0   Allergies  Allergen Reactions   Amphetamine-Dextroamphetamine Other (See Comments)   Fenofibrate Other (See Comments)   Ritalin [Methylphenidate] Other (See Comments)   Tape Dermatitis    Tearing of skin   Wound Dressing Adhesive Dermatitis    Tearing of skin    ROS:  Pertinent positives/negatives listed above.  I have reviewed patient's Past Medical Hx, Surgical Hx, Family Hx, Social Hx, medications and allergies.   Physical Exam  Patient Vitals for the past 24 hrs:  BP Temp Temp src Pulse Resp SpO2 Height Weight  01/08/23 1416 121/76 -- -- 88 -- 95 % -- --  01/08/23 1401 118/75 -- -- 90 -- 96 % -- --  01/08/23 1346 120/74 -- -- 90 -- 94 % -- --  01/08/23 1331 118/74 -- -- 91 -- 95 % -- --  01/08/23 1316 130/86 -- -- 90 -- -- -- --  01/08/23 1301 124/84 -- -- 92 -- 97 % -- --  01/08/23 1252 130/87 -- -- 89 -- 97 % -- --  01/08/23 1246 130/81 -- -- 85 -- 97 % -- --  01/08/23 1231 (!) 130/93 -- -- 92 -- 98 % -- --  01/08/23 1216 (!) 136/92 -- -- 92 -- 97 % -- --  01/08/23 1201 134/87 98.2 F (36.8 C) Oral 88 18 97 % -- --  01/08/23 1151 -- 98.2 F (36.8 C) Oral -- -- -- -- --  01/08/23 1145 -- -- -- -- -- -- 4\' 9"  (1.448 m) 83.5 kg   Constitutional: Well-developed, well-nourished female in no acute distress.  Cardiovascular: normal rate Respiratory: normal effort GI: Abd soft, non-tender. Pos BS x 4 MS: Extremities nontender, no edema, normal ROM Neurologic: Alert and oriented x 4.  GU: Neg CVAT.  PELVIC EXAM: Cervix pink, visually closed, without lesion, scant white creamy discharge, vaginal walls and external genitalia normal Bimanual exam: Cervix 0/long/high  FHT:  Baseline 145 , moderate variability, accelerations present, no decelerations Contractions: q 5 mins with uterine irritability  LAB RESULTS Results for orders placed or  performed during the hospital encounter of 01/08/23 (from the past 24 hour(s))  Wet prep, genital     Status: Abnormal   Collection Time: 01/08/23 12:16 PM  Result Value Ref Range   Yeast Wet Prep HPF POC NONE SEEN NONE SEEN   Trich, Wet Prep NONE SEEN NONE SEEN   Clue Cells Wet Prep HPF POC NONE SEEN NONE SEEN   WBC, Wet Prep HPF  POC >=10 (A) <10   Sperm NONE SEEN   Urinalysis, Routine w reflex microscopic -Urine, Clean Catch     Status: Abnormal   Collection Time: 01/08/23 12:56 PM  Result Value Ref Range   Color, Urine STRAW (A) YELLOW   APPearance CLEAR CLEAR   Specific Gravity, Urine 1.005 1.005 - 1.030   pH 7.0 5.0 - 8.0   Glucose, UA NEGATIVE NEGATIVE mg/dL   Hgb urine dipstick NEGATIVE NEGATIVE   Bilirubin Urine NEGATIVE NEGATIVE   Ketones, ur NEGATIVE NEGATIVE mg/dL   Protein, ur NEGATIVE NEGATIVE mg/dL   Nitrite NEGATIVE NEGATIVE   Leukocytes,Ua NEGATIVE NEGATIVE  Protein / creatinine ratio, urine     Status: Abnormal   Collection Time: 01/08/23 12:56 PM  Result Value Ref Range   Creatinine, Urine 39 mg/dL   Total Protein, Urine 8 mg/dL   Protein Creatinine Ratio 0.21 (H) 0.00 - 0.15 mg/mg[Cre]  CBC     Status: Abnormal   Collection Time: 01/08/23  1:00 PM  Result Value Ref Range   WBC 9.8 4.0 - 10.5 K/uL   RBC 3.69 (L) 3.87 - 5.11 MIL/uL   Hemoglobin 11.7 (L) 12.0 - 15.0 g/dL   HCT 16.1 (L) 09.6 - 04.5 %   MCV 91.3 80.0 - 100.0 fL   MCH 31.7 26.0 - 34.0 pg   MCHC 34.7 30.0 - 36.0 g/dL   RDW 40.9 81.1 - 91.4 %   Platelets 146 (L) 150 - 400 K/uL   nRBC 0.0 0.0 - 0.2 %  Comprehensive metabolic panel     Status: Abnormal   Collection Time: 01/08/23  1:00 PM  Result Value Ref Range   Sodium 134 (L) 135 - 145 mmol/L   Potassium 3.1 (L) 3.5 - 5.1 mmol/L   Chloride 105 98 - 111 mmol/L   CO2 20 (L) 22 - 32 mmol/L   Glucose, Bld 93 70 - 99 mg/dL   BUN 6 6 - 20 mg/dL   Creatinine, Ser 7.82 0.44 - 1.00 mg/dL   Calcium 8.9 8.9 - 95.6 mg/dL   Total Protein 5.9 (L)  6.5 - 8.1 g/dL   Albumin 2.8 (L) 3.5 - 5.0 g/dL   AST 13 (L) 15 - 41 U/L   ALT 12 0 - 44 U/L   Alkaline Phosphatase 58 38 - 126 U/L   Total Bilirubin 0.3 0.3 - 1.2 mg/dL   GFR, Estimated >21 >30 mL/min   Anion gap 9 5 - 15       IMAGING No results found.  MAU Management/MDM: Orders Placed This Encounter  Procedures   Wet prep, genital   Urinalysis, Routine w reflex microscopic -Urine, Clean Catch   CBC   Comprehensive metabolic panel   Protein / creatinine ratio, urine   POCT fern test   Discharge patient    Meds ordered this encounter  Medications   NIFEdipine (PROCARDIA) capsule 10 mg   potassium chloride SA (KLOR-CON M) CR tablet 40 mEq   NIFEdipine (PROCARDIA XL) 30 MG 24 hr tablet    Sig: Take 1 tablet (30 mg total) by mouth daily.    Dispense:  90 tablet    Refill:  1    Patient is not in preterm labor at this time as she has had no cervical change with these mild contractions lasting several hours. Will additionally evaluate for yeast/BV, UTI that could be contributing to irritability. Exam does not show s/sx of PPROM. Will push PO fluids and trial procardia for management of  symptoms.  1244: Patient has had two mild range pressures while in MAU. Will perform preE r/o.  1426: Patient reassessed. Feels much improved after PO fluids and procardia x2. Wet mount, UA wnl. CBC with mild anemia and mild thrombocytopenia at patient's baseline. CMP with hypokalemia and normal LFTs. P:C wnl. K repleted. Placed call to Dr. Richardson Dopp who is in OR and will call back.  1447: Discussed patient with Dr. Richardson Dopp. Agrees with starting Procardial XL 30 mg every day for cHTN and preterm ctx. Patient agreeable. Return precautions of preterm labor, preE discussed.  ASSESSMENT 1. Chronic hypertension affecting pregnancy   2. Preterm uterine contractions in third trimester, antepartum   3. Hypokalemia   4. [redacted] weeks gestation of pregnancy     PLAN Discharge home with strict return  precautions. Allergies as of 01/08/2023       Reactions   Amphetamine-dextroamphetamine Other (See Comments)   Fenofibrate Other (See Comments)   Ritalin [methylphenidate] Other (See Comments)   Tape Dermatitis   Tearing of skin   Wound Dressing Adhesive Dermatitis   Tearing of skin        Medication List     STOP taking these medications    propranolol 20 MG tablet Commonly known as: INDERAL       TAKE these medications    acetaminophen 500 MG tablet Commonly known as: TYLENOL Take 500 mg by mouth every 6 (six) hours as needed for headache.   Azelastine-Fluticasone 137-50 MCG/ACT Susp Place 1 spray into the nose 2 (two) times a day.   Botox 200 units injection Generic drug: botulinum toxin Type A Inject as directed every 3 (three) months.   cetirizine 10 MG tablet Commonly known as: ZYRTEC Take 10 mg by mouth daily.   Cyanocobalamin 1000 MCG/ML Kit Inject 1 mL as directed every 30 (thirty) days.   cyclobenzaprine 5 MG tablet Commonly known as: FLEXERIL Take 1-2 tablets (5-10 mg total) by mouth 3 (three) times daily as needed for muscle spasms.   fluticasone 50 MCG/ACT nasal spray Commonly known as: FLONASE Place 1 spray into both nostrils daily.   folic acid 1 MG tablet Commonly known as: FOLVITE Take 1 mg by mouth daily.   metoCLOPramide 10 MG tablet Commonly known as: Reglan Take 1 tablet (10 mg total) by mouth every 6 (six) hours as needed for nausea.   NIFEdipine 30 MG 24 hr tablet Commonly known as: Procardia XL Take 1 tablet (30 mg total) by mouth daily.   ondansetron 8 MG disintegrating tablet Commonly known as: ZOFRAN-ODT Take by mouth daily.   PRE-NATAL PO Take by mouth.   sertraline 25 MG tablet Commonly known as: ZOLOFT Take 50 mg by mouth daily.   SUMAtriptan 25 MG tablet Commonly known as: IMITREX Take 100 mg by mouth daily as needed.   Zomig 5 MG nasal solution Generic drug: zolmitriptan Place 1 spray into the nose daily  as needed.         Wylene Simmer, MD OB Fellow 01/08/2023  2:50 PM

## 2023-01-12 ENCOUNTER — Telehealth: Payer: Self-pay | Admitting: Physical Therapy

## 2023-01-12 NOTE — Telephone Encounter (Signed)
Called and spoke to patient d/t recent hospitalizations. She states that she was advised to stay on bed rest, however migraines has been worse recently d/t a recent medication change. Educated pt on gentle stretching while maintaining bed rest precautions and use of heat on cervical musculature. Agreed to cx upcoming appointment and discharge patient at this time.    PHYSICAL THERAPY DISCHARGE SUMMARY  Visits from Start of Care: 12  Current functional level related to goals / functional outcomes: Unable to assess; patient did not return d/t medical complications of pregnancy    Remaining deficits: Unable to assess   Education / Equipment: HEP  Plan: Patient agrees to discharge.  Patient goals were partially met. Patient is being discharged due to meeting the stated rehab goals.        Anette Guarneri, PT, DPT 01/12/23 9:40 AM  [facility] at [facility] [address] [place], [address] [place], [place] [address] Phone # [phone] Fax # [phone]

## 2023-01-14 ENCOUNTER — Ambulatory Visit: Payer: Medicaid Other | Admitting: Physical Therapy

## 2023-01-15 ENCOUNTER — Encounter (HOSPITAL_COMMUNITY): Payer: Self-pay | Admitting: Obstetrics and Gynecology

## 2023-01-15 ENCOUNTER — Inpatient Hospital Stay (HOSPITAL_COMMUNITY)
Admission: AD | Admit: 2023-01-15 | Discharge: 2023-01-15 | Disposition: A | Discharge status: Home / Self Care | Payer: Medicaid Other | Attending: Family Medicine | Admitting: Family Medicine

## 2023-01-15 ENCOUNTER — Other Ambulatory Visit: Payer: Self-pay

## 2023-01-15 DIAGNOSIS — O09293 Supervision of pregnancy with other poor reproductive or obstetric history, third trimester: Secondary | ICD-10-CM | POA: Insufficient documentation

## 2023-01-15 DIAGNOSIS — Z3A31 31 weeks gestation of pregnancy: Secondary | ICD-10-CM | POA: Diagnosis not present

## 2023-01-15 DIAGNOSIS — O4703 False labor before 37 completed weeks of gestation, third trimester: Secondary | ICD-10-CM | POA: Diagnosis present

## 2023-01-15 DIAGNOSIS — I10 Essential (primary) hypertension: Secondary | ICD-10-CM

## 2023-01-15 DIAGNOSIS — O10013 Pre-existing essential hypertension complicating pregnancy, third trimester: Secondary | ICD-10-CM | POA: Insufficient documentation

## 2023-01-15 LAB — PROTEIN / CREATININE RATIO, URINE
Creatinine, Urine: 127 mg/dL
Protein Creatinine Ratio: 0.21 mg/mg{creat} — ABNORMAL HIGH (ref 0.00–0.15)
Total Protein, Urine: 27 mg/dL

## 2023-01-15 LAB — CBC
HCT: 34.2 % — ABNORMAL LOW (ref 36.0–46.0)
Hemoglobin: 11.7 g/dL — ABNORMAL LOW (ref 12.0–15.0)
MCH: 31.2 pg (ref 26.0–34.0)
MCHC: 34.2 g/dL (ref 30.0–36.0)
MCV: 91.2 fL (ref 80.0–100.0)
Platelets: 169 10*3/uL (ref 150–400)
RBC: 3.75 MIL/uL — ABNORMAL LOW (ref 3.87–5.11)
RDW: 13.7 % (ref 11.5–15.5)
WBC: 9.6 10*3/uL (ref 4.0–10.5)
nRBC: 0 % (ref 0.0–0.2)

## 2023-01-15 LAB — COMPREHENSIVE METABOLIC PANEL
ALT: 13 U/L (ref 0–44)
AST: 15 U/L (ref 15–41)
Albumin: 2.8 g/dL — ABNORMAL LOW (ref 3.5–5.0)
Alkaline Phosphatase: 59 U/L (ref 38–126)
Anion gap: 12 (ref 5–15)
BUN: 7 mg/dL (ref 6–20)
CO2: 20 mmol/L — ABNORMAL LOW (ref 22–32)
Calcium: 9.2 mg/dL (ref 8.9–10.3)
Chloride: 101 mmol/L (ref 98–111)
Creatinine, Ser: 0.63 mg/dL (ref 0.44–1.00)
GFR, Estimated: >60 mL/min (ref >60)
Glucose, Bld: 82 mg/dL (ref 70–99)
Potassium: 4 mmol/L (ref 3.5–5.1)
Sodium: 133 mmol/L — ABNORMAL LOW (ref 135–145)
Total Bilirubin: 0.6 mg/dL (ref 0.3–1.2)
Total Protein: 6.1 g/dL — ABNORMAL LOW (ref 6.5–8.1)

## 2023-01-15 LAB — URINALYSIS, ROUTINE W REFLEX MICROSCOPIC
Bilirubin Urine: NEGATIVE
Glucose, UA: NEGATIVE mg/dL
Hgb urine dipstick: NEGATIVE
Ketones, ur: NEGATIVE mg/dL
Leukocytes,Ua: NEGATIVE
Nitrite: NEGATIVE
Protein, ur: NEGATIVE mg/dL
Specific Gravity, Urine: 1.014 (ref 1.005–1.030)
pH: 7 (ref 5.0–8.0)

## 2023-01-15 MED ORDER — BETAMETHASONE SOD PHOS & ACET 6 (3-3) MG/ML IJ SUSP
12.0000 mg | INTRAMUSCULAR | Status: DC
  Administered 2023-01-15: 12 mg via INTRAMUSCULAR
  Filled 2023-01-15: qty 5

## 2023-01-15 MED ORDER — NIFEDIPINE ER OSMOTIC RELEASE 30 MG PO TB24: 30.0000 mg | ORAL_TABLET | Freq: Two times a day (BID) | ORAL | 5 refills | Status: DC

## 2023-01-15 NOTE — Discharge Instructions (Signed)
You were seen in the MAU for preterm contractions. We checked your cervix, and it was closed, meaning you are NOT in preterm labor.  To help reduce your contraction frequency, I have increased your Procardia dose from 30 mg once daily to 30 mg twice daily, and I have sent an updated prescription to your pharmacy on file.   In addition because you are at risk for preterm delivery, we gave you a shot of a steroid called betamethasone. You need to return tomorrow by noon to receive a second dose.   Return if your contractions are getting worse, you have leaking fluid or vaginal bleeding, or the baby is not moving like normal.

## 2023-01-15 NOTE — MAU Note (Signed)
[name] [name] is a 32 y.o. at [redacted]w[redacted]d here in MAU reporting: she's having ctxs that are every 3 minutes apart now but began since last night and have gradually gotten closer.  Denies VB or LOF.  Endorses +FM.  LMP: NA Onset of complaint: last night Pain score: 0 Vitals:   01/15/23 1106  BP: 136/84  Pulse: 96  Resp: 18  Temp: 97.6 F (36.4 C)  SpO2: 99%     FHT:147 bpm Lab orders placed from triage:   UA

## 2023-01-15 NOTE — MAU Provider Note (Signed)
History     [id number]  Arrival date and time: 01/15/23 1040    No chief complaint on file.    HPI [name] [name] is a 32 y.o. at [redacted]w[redacted]d with PMHx notable for severe pre-eclampsia with iatrogenic preterm delivery by cesarean last pregnancy, cHTN, and preterm contractions, who presents for worsening contractions.   Patient reports she has been having contractions for the past few weeks Over the past day or so they got progressively more intense, though not yet exactly painful, and much more frequent, as often as 5 min apart or even slightly less No bleeding, no leaking fluid Fetal movement is normal She is currently on procardia xl 30 daily for contractions, rx by her OB office She also takes propanolol She was last checked in the office about a week ago and was not dilated at that time      OB History     Gravida  4   Para  1   Term      Preterm  1   AB  2   Living  1      SAB  2   IAB      Ectopic      Multiple      Live Births  1           Past Medical History:  Diagnosis Date   Depression    EDS (Ehlers-Danlos syndrome)    Hyperlipidemia    Migraine    Ovarian cyst    POTS (postural orthostatic tachycardia syndrome)    POTS (postural orthostatic tachycardia syndrome)    Preeclampsia, severe    Transaminitis    Vitamin B12 deficiency    Vitamin D deficiency     Past Surgical History:  Procedure Laterality Date   CESAREAN SECTION     EYE SURGERY     x2   WISDOM TOOTH EXTRACTION      Family History  Problem Relation Age of Onset   Hypertension Mother    Anxiety disorder Mother    Other Father        unknown   Osteoarthritis Paternal Grandmother     Social History   Socioeconomic History   Marital status: Single    Spouse name: Not on file   Number of children: Not on file   Years of education: Not on file   Highest education level: Not on file  Occupational History   Not on file  Tobacco Use   Smoking status: Never    Smokeless tobacco: Never  Vaping Use   Vaping status: Never Used  Substance and Sexual Activity   Alcohol use: Not Currently   Drug use: Not Currently   Sexual activity: Yes    Birth control/protection: None  Other Topics Concern   Not on file  Social History Narrative   Not on file   Social Determinants of Health   Financial Resource Strain: Not on file  Food Insecurity: No Food Insecurity (02/02/2020)   Received from [facility] visits prior to 06/06/2022.   Hunger Vital Sign    Worried About Running Out of Food in the Last Year: Never true    Ran Out of Food in the Last Year: Never true  Transportation Needs: Not on file  Physical Activity: Not on file  Stress: Not on file  Social Connections: Not on file  Intimate Partner Violence: Not At Risk (02/02/2020)   Received from [facility] visits prior to 06/06/2022.  Humiliation, Afraid, Rape, and Kick questionnaire    Fear of Current or Ex-Partner: No    Emotionally Abused: No    Physically Abused: No    Sexually Abused: No    Allergies  Allergen Reactions   Amphetamine-Dextroamphetamine Other (See Comments)   Fenofibrate Other (See Comments)   Ritalin [Methylphenidate] Other (See Comments)   Tape Dermatitis    Tearing of skin   Wound Dressing Adhesive Dermatitis    Tearing of skin    No current facility-administered medications on file prior to encounter.   Current Outpatient Medications on File Prior to Encounter  Medication Sig Dispense Refill   Botulinum Toxin Type A (BOTOX) 200 units SOLR Inject as directed every 3 (three) months.     cetirizine (ZYRTEC) 10 MG tablet Take 10 mg by mouth daily.     Cyanocobalamin 1000 MCG/ML KIT Inject 1 mL as directed every 30 (thirty) days.     cyclobenzaprine (FLEXERIL) 5 MG tablet Take 1-2 tablets (5-10 mg total) by mouth 3 (three) times daily as needed for muscle spasms. 30 tablet 0   folic acid (FOLVITE) 1 MG tablet Take 1 mg  by mouth daily.     metoCLOPramide (REGLAN) 10 MG tablet Take 1 tablet (10 mg total) by mouth every 6 (six) hours as needed for nausea. 30 tablet 0   Prenatal Multivit-Min-Fe-FA (PRE-NATAL PO) Take by mouth.     sertraline (ZOLOFT) 25 MG tablet Take 50 mg by mouth daily.      SUMAtriptan (IMITREX) 25 MG tablet Take 100 mg by mouth daily as needed.     acetaminophen (TYLENOL) 500 MG tablet Take 500 mg by mouth every 6 (six) hours as needed for headache.     Azelastine-Fluticasone 137-50 MCG/ACT SUSP Place 1 spray into the nose 2 (two) times a day.     fluticasone (FLONASE) 50 MCG/ACT nasal spray Place 1 spray into both nostrils daily.     ondansetron (ZOFRAN-ODT) 8 MG disintegrating tablet Take by mouth daily.     zolmitriptan (ZOMIG) 5 MG nasal solution Place 1 spray into the nose daily as needed.       ROS Pertinent positives and negative per HPI, all others reviewed and negative  Physical Exam   BP (!) 137/97   Pulse 90   Temp 97.6 F (36.4 C) (Oral)   Resp 18   Ht 4\' 9"  (1.448 m)   Wt 84.6 kg   SpO2 98%   BMI 40.38 kg/m   Patient Vitals for the past 24 hrs:  BP Temp Temp src Pulse Resp SpO2 Height Weight  01/15/23 1250 -- -- -- -- -- 98 % -- --  01/15/23 1245 (!) 137/97 -- -- 90 -- 98 % -- --  01/15/23 1240 -- -- -- -- -- 98 % -- --  01/15/23 1235 -- -- -- -- -- 97 % -- --  01/15/23 1230 131/86 -- -- 90 -- 98 % -- --  01/15/23 1225 -- -- -- -- -- 98 % -- --  01/15/23 1220 -- -- -- -- -- 98 % -- --  01/15/23 1215 129/88 -- -- 88 -- 97 % -- --  01/15/23 1210 -- -- -- -- -- 97 % -- --  01/15/23 1205 -- -- -- -- -- 98 % -- --  01/15/23 1200 132/84 -- -- 83 -- 98 % -- --  01/15/23 1155 -- -- -- -- -- 98 % -- --  01/15/23 1150 -- -- -- -- -- 98 % -- --  01/15/23 1145 (!) 121/91 -- -- 92 -- 99 % -- --  01/15/23 1140 -- -- -- -- -- 99 % -- --  01/15/23 1135 -- -- -- -- -- 99 % -- --  01/15/23 1133 (!) 141/92 -- -- 99 -- -- -- --  01/15/23 1106 136/84 97.6 F (36.4 C) Oral  96 18 99 % -- --  01/15/23 1100 -- -- -- -- -- -- 4\' 9"  (1.448 m) 84.6 kg    Physical Exam Vitals reviewed.  Constitutional:      General: She is not in acute distress.    Appearance: She is well-developed. She is not diaphoretic.  Eyes:     General: No scleral icterus. Pulmonary:     Effort: Pulmonary effort is normal. No respiratory distress.  Abdominal:     General: There is no distension.     Palpations: Abdomen is soft.     Tenderness: There is no abdominal tenderness. There is no guarding or rebound.  Skin:    General: Skin is warm and dry.  Neurological:     Mental Status: She is alert.     Coordination: Coordination normal.      Cervical Exam Dilation: Closed Exam by:: Polk Minor,MD  Bedside Ultrasound Not performed.  My interpretation: n/a  FHT Baseline: 145 bpm Variability: Good {> 6 bpm) Accelerations: Reactive Decelerations: Absent Uterine activity: irregular, q 2-7 min Cat: I  Labs Results for orders placed or performed during the hospital encounter of 01/15/23 (from the past 24 hour(s))  Urinalysis, Routine w reflex microscopic -Urine, Clean Catch     Status: Abnormal   Collection Time: 01/15/23 12:10 PM  Result Value Ref Range   Color, Urine YELLOW YELLOW   APPearance HAZY (A) CLEAR   Specific Gravity, Urine 1.014 1.005 - 1.030   pH 7.0 5.0 - 8.0   Glucose, UA NEGATIVE NEGATIVE mg/dL   Hgb urine dipstick NEGATIVE NEGATIVE   Bilirubin Urine NEGATIVE NEGATIVE   Ketones, ur NEGATIVE NEGATIVE mg/dL   Protein, ur NEGATIVE NEGATIVE mg/dL   Nitrite NEGATIVE NEGATIVE   Leukocytes,Ua NEGATIVE NEGATIVE    Imaging No results found.  MAU Course  Procedures Lab Orders         Urinalysis, Routine w reflex microscopic -Urine, Clean Catch         CBC         Comprehensive metabolic panel         Protein / creatinine ratio, urine    Meds ordered this encounter  Medications   betamethasone acetate-betamethasone sodium phosphate (CELESTONE) injection  12 mg   NIFEdipine (PROCARDIA XL) 30 MG 24 hr tablet    Sig: Take 1 tablet (30 mg total) by mouth in the morning and at bedtime.    Dispense:  60 tablet    Refill:  5   Imaging Orders  No imaging studies ordered today    MDM Moderate (Level 3-4)  Assessment and Plan  # Preterm contractions #History of severe-preeclampsia #[redacted] weeks gestation of pregnancy Cervix completely closed on exam. Discussed with patient at this point given how comfortable she looks and lack of cervical dilation, do not feel that serial exams are worthwhile. Discussed increasing procardia from 30 once daily to BID, she is in agreement with this plan. Discussed return precautions in detail, though going to be difficult to know when to come back but should just go with her gut, or if she has bleeding/leaking fluid/DFM.  Regarding her BP, some mild range BP's but asymptomatic.  Increase in procardia will help. Obtained labs and will call patient if results are concerning for severe PreE.   Finally given ongoing and per patient worsening contractions, discussed administration of betamethasone. She is in agreement with this plan. First dose given here, will return to MAU tomorrow for second dose.    #FWB FHT Cat I NST: Reactive   Dispo: discharged to home in stable condition    Venora Maples, MD/MPH 01/15/23 12:57 PM  Allergies as of 01/15/2023       Reactions   Amphetamine-dextroamphetamine Other (See Comments)   Fenofibrate Other (See Comments)   Ritalin [methylphenidate] Other (See Comments)   Tape Dermatitis   Tearing of skin   Wound Dressing Adhesive Dermatitis   Tearing of skin        Medication List     TAKE these medications    acetaminophen 500 MG tablet Commonly known as: TYLENOL Take 500 mg by mouth every 6 (six) hours as needed for headache.   Azelastine-Fluticasone 137-50 MCG/ACT Susp Place 1 spray into the nose 2 (two) times a day.   Botox 200 units injection Generic  drug: botulinum toxin Type A Inject as directed every 3 (three) months.   cetirizine 10 MG tablet Commonly known as: ZYRTEC Take 10 mg by mouth daily.   Cyanocobalamin 1000 MCG/ML Kit Inject 1 mL as directed every 30 (thirty) days.   cyclobenzaprine 5 MG tablet Commonly known as: FLEXERIL Take 1-2 tablets (5-10 mg total) by mouth 3 (three) times daily as needed for muscle spasms.   fluticasone 50 MCG/ACT nasal spray Commonly known as: FLONASE Place 1 spray into both nostrils daily.   folic acid 1 MG tablet Commonly known as: FOLVITE Take 1 mg by mouth daily.   metoCLOPramide 10 MG tablet Commonly known as: Reglan Take 1 tablet (10 mg total) by mouth every 6 (six) hours as needed for nausea.   NIFEdipine 30 MG 24 hr tablet Commonly known as: Procardia XL Take 1 tablet (30 mg total) by mouth in the morning and at bedtime. What changed: when to take this   ondansetron 8 MG disintegrating tablet Commonly known as: ZOFRAN-ODT Take by mouth daily.   PRE-NATAL PO Take by mouth.   sertraline 25 MG tablet Commonly known as: ZOLOFT Take 50 mg by mouth daily.   SUMAtriptan 25 MG tablet Commonly known as: IMITREX Take 100 mg by mouth daily as needed.   Zomig 5 MG nasal solution Generic drug: zolmitriptan Place 1 spray into the nose daily as needed.

## 2023-01-16 ENCOUNTER — Inpatient Hospital Stay (HOSPITAL_COMMUNITY)
Admission: AD | Admit: 2023-01-16 | Discharge: 2023-01-16 | Disposition: A | Discharge status: Home / Self Care | Payer: Medicaid Other | Attending: Obstetrics and Gynecology | Admitting: Obstetrics and Gynecology

## 2023-01-16 DIAGNOSIS — O4703 False labor before 37 completed weeks of gestation, third trimester: Secondary | ICD-10-CM | POA: Diagnosis present

## 2023-01-16 DIAGNOSIS — Z3A31 31 weeks gestation of pregnancy: Secondary | ICD-10-CM | POA: Diagnosis not present

## 2023-01-16 MED ORDER — BETAMETHASONE SOD PHOS & ACET 6 (3-3) MG/ML IJ SUSP
12.0000 mg | Freq: Once | INTRAMUSCULAR | Status: AC
  Administered 2023-01-16: 12 mg via INTRAMUSCULAR

## 2023-01-16 NOTE — MAU Note (Signed)
..  [name] [name] is a 32 y.o. at [redacted]w[redacted]d here in MAU reporting: here for repeat dose of bmz. Denies VB or LOF. +FM. States she has felt contractions today, but they are not painful. Takes her next dose of procardia at 1100. Pain score: 0 Vitals:   01/16/23 1001  BP: 137/89  Pulse: 97  Resp: 14  Temp: 98.2 F (36.8 C)  SpO2: 98%     FHT:151 Lab orders placed from triage:   none

## 2023-01-16 NOTE — MAU Provider Note (Signed)
[name] is a 32 y.o. [phone] patient who presents to MAU today for second dose of Betamethasone. She presented to MAU yesterday for preterm contractions, which have been worsening, therefore pt started on course of Betamethasone. She tolerated her first dose well. No VB, LOF, ctx. Reports +FM.  O BP 137/89 (BP Location: Right Arm)   Pulse 97   Temp 98.2 F (36.8 C) (Oral)   Resp 14   Wt 84.9 kg   SpO2 98%   BMI 40.49 kg/m  Physical Exam Constitutional:      General: She is not in acute distress.    Appearance: Normal appearance.  Cardiovascular:     Rate and Rhythm: Normal rate.  Pulmonary:     Effort: Pulmonary effort is normal. No respiratory distress.     Breath sounds: Normal breath sounds.  Abdominal:     Comments: gravid  Skin:    General: Skin is warm and dry.     Findings: No rash.  Neurological:     General: No focal deficit present.     Mental Status: She is alert and oriented to person, place, and time.  Psychiatric:        Mood and Affect: Mood normal.        Behavior: Behavior normal.     A Medical screening exam complete Preterm contractions  P Given second dose of Betamethasone today, tolerated well Given si/sx preterm labor Discharge from MAU in stable condition Patient may return to MAU as needed   Sundra Aland, MD 01/16/2023 10:02 AM

## 2023-01-23 ENCOUNTER — Encounter (HOSPITAL_COMMUNITY): Payer: Self-pay | Admitting: Obstetrics and Gynecology

## 2023-01-23 ENCOUNTER — Inpatient Hospital Stay (HOSPITAL_COMMUNITY)
Admission: AD | Admit: 2023-01-23 | Discharge: 2023-01-24 | Disposition: A | Discharge status: Home / Self Care | Payer: Medicaid Other | Attending: Obstetrics & Gynecology | Admitting: Obstetrics & Gynecology

## 2023-01-23 DIAGNOSIS — R110 Nausea: Secondary | ICD-10-CM | POA: Diagnosis present

## 2023-01-23 DIAGNOSIS — H538 Other visual disturbances: Secondary | ICD-10-CM | POA: Diagnosis present

## 2023-01-23 DIAGNOSIS — O09293 Supervision of pregnancy with other poor reproductive or obstetric history, third trimester: Secondary | ICD-10-CM | POA: Diagnosis not present

## 2023-01-23 DIAGNOSIS — R600 Localized edema: Secondary | ICD-10-CM | POA: Diagnosis present

## 2023-01-23 DIAGNOSIS — I10 Essential (primary) hypertension: Secondary | ICD-10-CM | POA: Diagnosis not present

## 2023-01-23 DIAGNOSIS — O10013 Pre-existing essential hypertension complicating pregnancy, third trimester: Secondary | ICD-10-CM | POA: Insufficient documentation

## 2023-01-23 DIAGNOSIS — Z3A32 32 weeks gestation of pregnancy: Secondary | ICD-10-CM | POA: Diagnosis not present

## 2023-01-23 DIAGNOSIS — H539 Unspecified visual disturbance: Secondary | ICD-10-CM

## 2023-01-23 LAB — URINALYSIS, ROUTINE W REFLEX MICROSCOPIC
Bilirubin Urine: NEGATIVE
Glucose, UA: NEGATIVE mg/dL
Hgb urine dipstick: NEGATIVE
Ketones, ur: NEGATIVE mg/dL
Leukocytes,Ua: NEGATIVE
Nitrite: NEGATIVE
Protein, ur: NEGATIVE mg/dL
Specific Gravity, Urine: 1.005 (ref 1.005–1.030)
pH: 7 (ref 5.0–8.0)

## 2023-01-23 LAB — PROTEIN / CREATININE RATIO, URINE
Creatinine, Urine: 38 mg/dL
Protein Creatinine Ratio: 0.18 mg/mg{creat} — ABNORMAL HIGH (ref 0.00–0.15)
Total Protein, Urine: 7 mg/dL

## 2023-01-23 LAB — CBC
HCT: 32.5 % — ABNORMAL LOW (ref 36.0–46.0)
Hemoglobin: 11.4 g/dL — ABNORMAL LOW (ref 12.0–15.0)
MCH: 31.8 pg (ref 26.0–34.0)
MCHC: 35.1 g/dL (ref 30.0–36.0)
MCV: 90.5 fL (ref 80.0–100.0)
Platelets: 149 10*3/uL — ABNORMAL LOW (ref 150–400)
RBC: 3.59 MIL/uL — ABNORMAL LOW (ref 3.87–5.11)
RDW: 13.8 % (ref 11.5–15.5)
WBC: 10.9 10*3/uL — ABNORMAL HIGH (ref 4.0–10.5)
nRBC: 0 % (ref 0.0–0.2)

## 2023-01-23 LAB — COMPREHENSIVE METABOLIC PANEL
ALT: 11 U/L (ref 0–44)
AST: 13 U/L — ABNORMAL LOW (ref 15–41)
Albumin: 2.7 g/dL — ABNORMAL LOW (ref 3.5–5.0)
Alkaline Phosphatase: 60 U/L (ref 38–126)
Anion gap: 11 (ref 5–15)
BUN: 8 mg/dL (ref 6–20)
CO2: 21 mmol/L — ABNORMAL LOW (ref 22–32)
Calcium: 9.4 mg/dL (ref 8.9–10.3)
Chloride: 107 mmol/L (ref 98–111)
Creatinine, Ser: 0.74 mg/dL (ref 0.44–1.00)
GFR, Estimated: >60 mL/min (ref >60)
Glucose, Bld: 103 mg/dL — ABNORMAL HIGH (ref 70–99)
Potassium: 3.8 mmol/L (ref 3.5–5.1)
Sodium: 139 mmol/L (ref 135–145)
Total Bilirubin: 0.4 mg/dL (ref 0.3–1.2)
Total Protein: 5.7 g/dL — ABNORMAL LOW (ref 6.5–8.1)

## 2023-01-23 NOTE — MAU Provider Note (Incomplete)
Chief Complaint: Eye Problem   Event Date/Time   First Provider Initiated Contact with Patient 01/23/23 2211      SUBJECTIVE HPI: [name] [name] is a 32 y.o. [id number] at [redacted]w[redacted]d by LMP who presents to maternity admissions reporting spots in her vision. Notably has cHTN and had preE w/SF last pregnancy requiring preterm delivery.  Have been spots on and off today. Did not sleep last night 2/2 feeling "off". Bps have been in mild range per home monitoring. Did have migraine on Thursday which was consistent with her known migraines. This has resolved. Does note some overall puffiness in extremities. Denies RUQ pain, CP, SOB. Has been taking Procardia XL 30 mg BID and propranolol 20 mg TID as prescribed.   +FM. No urinary symptoms, fever/chills, LOF, VB. Braxton-Hicks.  Pt receives OB care at [facility]  HPI  Past Medical History:  Diagnosis Date   Depression    EDS (Ehlers-Danlos syndrome)    Hyperlipidemia    Migraine    Ovarian cyst    POTS (postural orthostatic tachycardia syndrome)    POTS (postural orthostatic tachycardia syndrome)    Preeclampsia, severe    Transaminitis    Vitamin B12 deficiency    Vitamin D deficiency    Past Surgical History:  Procedure Laterality Date   CESAREAN SECTION     EYE SURGERY     x2   WISDOM TOOTH EXTRACTION     Social History   Socioeconomic History   Marital status: Single    Spouse name: Not on file   Number of children: Not on file   Years of education: Not on file   Highest education level: Not on file  Occupational History   Not on file  Tobacco Use   Smoking status: Never   Smokeless tobacco: Never  Vaping Use   Vaping status: Never Used  Substance and Sexual Activity   Alcohol use: Not Currently   Drug use: Not Currently   Sexual activity: Yes    Birth control/protection: None  Other Topics Concern   Not on file  Social History Narrative   Not on file   Social Determinants of Health   Financial Resource Strain:  Not on file  Food Insecurity: No Food Insecurity (02/02/2020)   Received from [facility] visits prior to 06/06/2022.   Hunger Vital Sign    Worried About Running Out of Food in the Last Year: Never true    Ran Out of Food in the Last Year: Never true  Transportation Needs: Not on file  Physical Activity: Not on file  Stress: Not on file  Social Connections: Not on file  Intimate Partner Violence: Not At Risk (02/02/2020)   Received from [facility] visits prior to 06/06/2022.   Humiliation, Afraid, Rape, and Kick questionnaire    Fear of Current or Ex-Partner: No    Emotionally Abused: No    Physically Abused: No    Sexually Abused: No   No current facility-administered medications on file prior to encounter.   Current Outpatient Medications on File Prior to Encounter  Medication Sig Dispense Refill   Azelastine-Fluticasone 137-50 MCG/ACT SUSP Place 1 spray into the nose 2 (two) times a day.     cetirizine (ZYRTEC) 10 MG tablet Take 10 mg by mouth daily.     Cyanocobalamin 1000 MCG/ML KIT Inject 1 mL as directed every 30 (thirty) days.     cyclobenzaprine (FLEXERIL) 5 MG tablet Take 1-2 tablets (5-10 mg  total) by mouth 3 (three) times daily as needed for muscle spasms. 30 tablet 0   folic acid (FOLVITE) 1 MG tablet Take 1 mg by mouth daily.     metoCLOPramide (REGLAN) 10 MG tablet Take 1 tablet (10 mg total) by mouth every 6 (six) hours as needed for nausea. 30 tablet 0   ondansetron (ZOFRAN-ODT) 8 MG disintegrating tablet Take by mouth daily.     Prenatal Multivit-Min-Fe-FA (PRE-NATAL PO) Take by mouth.     propranolol (INDERAL) 20 MG tablet Take 20 mg by mouth 3 (three) times daily.     sertraline (ZOLOFT) 25 MG tablet Take 50 mg by mouth daily.      acetaminophen (TYLENOL) 500 MG tablet Take 500 mg by mouth every 6 (six) hours as needed for headache.     Botulinum Toxin Type A (BOTOX) 200 units SOLR Inject as directed every 3 (three)  months.     fluticasone (FLONASE) 50 MCG/ACT nasal spray Place 1 spray into both nostrils daily.     Allergies  Allergen Reactions   Amphetamine-Dextroamphetamine Other (See Comments)   Fenofibrate Other (See Comments)   Ritalin [Methylphenidate] Other (See Comments)   Tape Dermatitis    Tearing of skin   Wound Dressing Adhesive Dermatitis    Tearing of skin    ROS:  Pertinent positives/negatives listed above.  I have reviewed patient's Past Medical Hx, Surgical Hx, Family Hx, Social Hx, medications and allergies.   Physical Exam  Patient Vitals for the past 24 hrs:  BP Temp Pulse Resp SpO2 Height Weight  01/24/23 0031 135/89 -- 87 -- -- -- --  01/24/23 0016 136/88 -- 82 -- -- -- --  01/24/23 0001 133/88 -- 86 -- -- -- --  01/23/23 2350 (!) 140/90 -- 84 -- -- -- --  01/23/23 2331 138/89 -- 83 -- -- -- --  01/23/23 2316 139/84 -- 80 -- -- -- --  01/23/23 2301 (!) 144/92 -- 84 -- -- -- --  01/23/23 2246 (!) 145/93 -- 95 -- -- -- --  01/23/23 2231 137/86 -- 86 -- -- -- --  01/23/23 2216 (!) 145/93 -- 88 -- -- -- --  01/23/23 2208 134/86 -- 92 -- -- -- --  01/23/23 2135 135/88 -- -- -- -- -- --  01/23/23 2133 -- (!) 97.4 F (36.3 C) 91 16 99 % 4\' 9"  (1.448 m) 86.6 kg   Constitutional: Well-developed, well-nourished female in no acute distress.  Cardiovascular: normal rate Respiratory: normal effort GI: Abd soft, non-tender. Pos BS x 4 MS: Extremities nontender, no edema, normal ROM Neurologic: Alert and oriented x 4.   FHT:  Baseline 150 , moderate variability, accelerations present, no decelerations Contractions: q 6-7 mins. Not painful  LAB RESULTS Results for orders placed or performed during the hospital encounter of 01/23/23 (from the past 24 hour(s))  Urinalysis, Routine w reflex microscopic -Urine, Clean Catch     Status: Abnormal   Collection Time: 01/23/23  8:50 PM  Result Value Ref Range   Color, Urine STRAW (A) YELLOW   APPearance CLEAR CLEAR   Specific  Gravity, Urine 1.005 1.005 - 1.030   pH 7.0 5.0 - 8.0   Glucose, UA NEGATIVE NEGATIVE mg/dL   Hgb urine dipstick NEGATIVE NEGATIVE   Bilirubin Urine NEGATIVE NEGATIVE   Ketones, ur NEGATIVE NEGATIVE mg/dL   Protein, ur NEGATIVE NEGATIVE mg/dL   Nitrite NEGATIVE NEGATIVE   Leukocytes,Ua NEGATIVE NEGATIVE  Protein / creatinine ratio, urine     Status:  Abnormal   Collection Time: 01/23/23  8:50 PM  Result Value Ref Range   Creatinine, Urine 38 mg/dL   Total Protein, Urine 7 mg/dL   Protein Creatinine Ratio 0.18 (H) 0.00 - 0.15 mg/mg[Cre]  CBC     Status: Abnormal   Collection Time: 01/23/23 10:42 PM  Result Value Ref Range   WBC 10.9 (H) 4.0 - 10.5 K/uL   RBC 3.59 (L) 3.87 - 5.11 MIL/uL   Hemoglobin 11.4 (L) 12.0 - 15.0 g/dL   HCT 52.8 (L) 41.3 - 24.4 %   MCV 90.5 80.0 - 100.0 fL   MCH 31.8 26.0 - 34.0 pg   MCHC 35.1 30.0 - 36.0 g/dL   RDW 01.0 27.2 - 53.6 %   Platelets 149 (L) 150 - 400 K/uL   nRBC 0.0 0.0 - 0.2 %  Comprehensive metabolic panel     Status: Abnormal   Collection Time: 01/23/23 10:42 PM  Result Value Ref Range   Sodium 139 135 - 145 mmol/L   Potassium 3.8 3.5 - 5.1 mmol/L   Chloride 107 98 - 111 mmol/L   CO2 21 (L) 22 - 32 mmol/L   Glucose, Bld 103 (H) 70 - 99 mg/dL   BUN 8 6 - 20 mg/dL   Creatinine, Ser 6.44 0.44 - 1.00 mg/dL   Calcium 9.4 8.9 - 03.4 mg/dL   Total Protein 5.7 (L) 6.5 - 8.1 g/dL   Albumin 2.7 (L) 3.5 - 5.0 g/dL   AST 13 (L) 15 - 41 U/L   ALT 11 0 - 44 U/L   Alkaline Phosphatase 60 38 - 126 U/L   Total Bilirubin 0.4 0.3 - 1.2 mg/dL   GFR, Estimated >74 >25 mL/min   Anion gap 11 5 - 15      IMAGING No results found.  MAU Management/MDM: Orders Placed This Encounter  Procedures   Urinalysis, Routine w reflex microscopic -Urine, Clean Catch   CBC   Comprehensive metabolic panel   Protein / creatinine ratio, urine   Discharge patient    Meds ordered this encounter  Medications   NIFEdipine (PROCARDIA XL) 30 MG 24 hr tablet     Sig: Take 3 tablets (90 mg total) by mouth daily.    Dispense:  90 tablet    Refill:  0    Given patient's hx and home readings along with spots in vision, will obtain preE work-up and serial Bps.   0025: Reassessed patient. Feels well and notes no spots in her vision currently. Discussed preE work-up is negative. Plt ever so slightly low; this is consistent with previous levels for her. As Bps spike in evening, discussed increasing Procardia XL to 60 mg in AM and keeping 30 mg PM. Patient is amenable to this. Has BP cuff at home, and follow-up in office this week.  FWB: Reactive tracing throughout MAU stay  ASSESSMENT 1. Primary hypertension   2. [redacted] weeks gestation of pregnancy   3. Vision changes     PLAN Discharge home with strict return precautions for preE Plan to follow up with [facility] Allergies as of 01/24/2023       Reactions   Amphetamine-dextroamphetamine Other (See Comments)   Fenofibrate Other (See Comments)   Ritalin [methylphenidate] Other (See Comments)   Tape Dermatitis   Tearing of skin   Wound Dressing Adhesive Dermatitis   Tearing of skin        Medication List     STOP taking these medications    SUMAtriptan  25 MG tablet Commonly known as: IMITREX   Zomig 5 MG nasal solution Generic drug: zolmitriptan       TAKE these medications    acetaminophen 500 MG tablet Commonly known as: TYLENOL Take 500 mg by mouth every 6 (six) hours as needed for headache.   Azelastine-Fluticasone 137-50 MCG/ACT Susp Place 1 spray into the nose 2 (two) times a day.   Botox 200 units injection Generic drug: botulinum toxin Type A Inject as directed every 3 (three) months.   cetirizine 10 MG tablet Commonly known as: ZYRTEC Take 10 mg by mouth daily.   Cyanocobalamin 1000 MCG/ML Kit Inject 1 mL as directed every 30 (thirty) days.   cyclobenzaprine 5 MG tablet Commonly known as: FLEXERIL Take 1-2 tablets (5-10 mg total) by mouth 3 (three) times  daily as needed for muscle spasms.   fluticasone 50 MCG/ACT nasal spray Commonly known as: FLONASE Place 1 spray into both nostrils daily.   folic acid 1 MG tablet Commonly known as: FOLVITE Take 1 mg by mouth daily.   metoCLOPramide 10 MG tablet Commonly known as: Reglan Take 1 tablet (10 mg total) by mouth every 6 (six) hours as needed for nausea.   NIFEdipine 30 MG 24 hr tablet Commonly known as: Procardia XL Take 3 tablets (90 mg total) by mouth daily. What changed:  how much to take when to take this   ondansetron 8 MG disintegrating tablet Commonly known as: ZOFRAN-ODT Take by mouth daily.   PRE-NATAL PO Take by mouth.   propranolol 20 MG tablet Commonly known as: INDERAL Take 20 mg by mouth 3 (three) times daily.   sertraline 25 MG tablet Commonly known as: ZOLOFT Take 50 mg by mouth daily.         Wylene Simmer, MD OB Fellow 01/24/2023  12:50 AM

## 2023-01-23 NOTE — MAU Note (Addendum)
.  Rodricka Catron is a 32 y.o. at [redacted]w[redacted]d here in MAU reporting b/p being 165/90 at 2000. Seeing some spots today and blurry vision. Hands and feet swollen. Just not feeling well today. This is how she felt before becoming preeclamptic with her first baby. Some nausea. Taking Procardia 30ER BID and last dose 2030. Reports good FM and denies VB or LOF. Onset of complaint: this am. Takes Inderal 20mg  TID to QID and last dose was 2030 along with Procardia 30ER Pain score: 0 Vitals:   01/23/23 2133 01/23/23 2135  BP:  135/88  Pulse: 91   Resp: 16   Temp: (!) 97.4 F (36.3 C)   SpO2: 99%      FHT:135 Lab orders placed from triage:   U/a

## 2023-01-24 DIAGNOSIS — Z3A32 32 weeks gestation of pregnancy: Secondary | ICD-10-CM | POA: Diagnosis not present

## 2023-01-24 DIAGNOSIS — H539 Unspecified visual disturbance: Secondary | ICD-10-CM

## 2023-01-24 DIAGNOSIS — I10 Essential (primary) hypertension: Secondary | ICD-10-CM | POA: Diagnosis not present

## 2023-01-24 MED ORDER — NIFEDIPINE ER OSMOTIC RELEASE 30 MG PO TB24: 90.0000 mg | ORAL_TABLET | Freq: Every day | ORAL | 0 refills | Status: DC

## 2023-01-28 ENCOUNTER — Other Ambulatory Visit: Payer: Self-pay | Admitting: Obstetrics and Gynecology

## 2023-01-28 DIAGNOSIS — Z363 Encounter for antenatal screening for malformations: Secondary | ICD-10-CM

## 2023-01-28 DIAGNOSIS — O09299 Supervision of pregnancy with other poor reproductive or obstetric history, unspecified trimester: Secondary | ICD-10-CM | POA: Insufficient documentation

## 2023-01-28 DIAGNOSIS — O10919 Unspecified pre-existing hypertension complicating pregnancy, unspecified trimester: Secondary | ICD-10-CM

## 2023-01-28 DIAGNOSIS — O99210 Obesity complicating pregnancy, unspecified trimester: Secondary | ICD-10-CM | POA: Insufficient documentation

## 2023-01-28 DIAGNOSIS — O34219 Maternal care for unspecified type scar from previous cesarean delivery: Secondary | ICD-10-CM | POA: Insufficient documentation

## 2023-01-28 DIAGNOSIS — G90A Postural orthostatic tachycardia syndrome (POTS): Secondary | ICD-10-CM | POA: Insufficient documentation

## 2023-02-01 ENCOUNTER — Other Ambulatory Visit: Payer: Self-pay | Admitting: Obstetrics and Gynecology

## 2023-02-01 ENCOUNTER — Ambulatory Visit: Payer: Medicaid Other | Admitting: *Deleted

## 2023-02-01 ENCOUNTER — Ambulatory Visit: Payer: Medicaid Other | Attending: Obstetrics and Gynecology

## 2023-02-01 ENCOUNTER — Other Ambulatory Visit: Payer: Self-pay

## 2023-02-01 ENCOUNTER — Encounter: Payer: Self-pay | Admitting: *Deleted

## 2023-02-01 VITALS — BP 154/92 | HR 93

## 2023-02-01 DIAGNOSIS — G90A Postural orthostatic tachycardia syndrome (POTS): Secondary | ICD-10-CM

## 2023-02-01 DIAGNOSIS — O34219 Maternal care for unspecified type scar from previous cesarean delivery: Secondary | ICD-10-CM | POA: Insufficient documentation

## 2023-02-01 DIAGNOSIS — Z363 Encounter for antenatal screening for malformations: Secondary | ICD-10-CM

## 2023-02-01 DIAGNOSIS — Z3A33 33 weeks gestation of pregnancy: Secondary | ICD-10-CM

## 2023-02-01 DIAGNOSIS — O99210 Obesity complicating pregnancy, unspecified trimester: Secondary | ICD-10-CM | POA: Diagnosis present

## 2023-02-01 DIAGNOSIS — O10919 Unspecified pre-existing hypertension complicating pregnancy, unspecified trimester: Secondary | ICD-10-CM | POA: Diagnosis present

## 2023-02-01 DIAGNOSIS — O113 Pre-existing hypertension with pre-eclampsia, third trimester: Secondary | ICD-10-CM | POA: Diagnosis not present

## 2023-02-01 DIAGNOSIS — E669 Obesity, unspecified: Secondary | ICD-10-CM | POA: Diagnosis not present

## 2023-02-01 DIAGNOSIS — O10013 Pre-existing essential hypertension complicating pregnancy, third trimester: Secondary | ICD-10-CM

## 2023-02-01 DIAGNOSIS — O09299 Supervision of pregnancy with other poor reproductive or obstetric history, unspecified trimester: Secondary | ICD-10-CM

## 2023-02-01 DIAGNOSIS — O99212 Obesity complicating pregnancy, second trimester: Secondary | ICD-10-CM | POA: Diagnosis not present

## 2023-02-02 ENCOUNTER — Encounter (HOSPITAL_COMMUNITY): Payer: Self-pay | Admitting: Family Medicine

## 2023-02-02 ENCOUNTER — Inpatient Hospital Stay (HOSPITAL_COMMUNITY): Payer: Medicaid Other

## 2023-02-02 ENCOUNTER — Inpatient Hospital Stay (HOSPITAL_COMMUNITY)
Admission: AD | Admit: 2023-02-02 | Discharge: 2023-02-02 | Disposition: A | Discharge status: Home / Self Care | Payer: Medicaid Other | Attending: Family Medicine | Admitting: Family Medicine

## 2023-02-02 DIAGNOSIS — G90A Postural orthostatic tachycardia syndrome (POTS): Secondary | ICD-10-CM | POA: Insufficient documentation

## 2023-02-02 DIAGNOSIS — O99413 Diseases of the circulatory system complicating pregnancy, third trimester: Secondary | ICD-10-CM | POA: Insufficient documentation

## 2023-02-02 DIAGNOSIS — O113 Pre-existing hypertension with pre-eclampsia, third trimester: Secondary | ICD-10-CM | POA: Diagnosis not present

## 2023-02-02 DIAGNOSIS — O4703 False labor before 37 completed weeks of gestation, third trimester: Secondary | ICD-10-CM | POA: Diagnosis present

## 2023-02-02 DIAGNOSIS — Z3A33 33 weeks gestation of pregnancy: Secondary | ICD-10-CM

## 2023-02-02 DIAGNOSIS — F418 Other specified anxiety disorders: Secondary | ICD-10-CM | POA: Diagnosis not present

## 2023-02-02 DIAGNOSIS — O99343 Other mental disorders complicating pregnancy, third trimester: Secondary | ICD-10-CM | POA: Insufficient documentation

## 2023-02-02 DIAGNOSIS — O09293 Supervision of pregnancy with other poor reproductive or obstetric history, third trimester: Secondary | ICD-10-CM | POA: Insufficient documentation

## 2023-02-02 DIAGNOSIS — R030 Elevated blood-pressure reading, without diagnosis of hypertension: Secondary | ICD-10-CM | POA: Diagnosis present

## 2023-02-02 DIAGNOSIS — O4700 False labor before 37 completed weeks of gestation, unspecified trimester: Secondary | ICD-10-CM

## 2023-02-02 DIAGNOSIS — O10013 Pre-existing essential hypertension complicating pregnancy, third trimester: Secondary | ICD-10-CM

## 2023-02-02 DIAGNOSIS — O99213 Obesity complicating pregnancy, third trimester: Secondary | ICD-10-CM | POA: Insufficient documentation

## 2023-02-02 DIAGNOSIS — O99353 Diseases of the nervous system complicating pregnancy, third trimester: Secondary | ICD-10-CM

## 2023-02-02 LAB — URINALYSIS, ROUTINE W REFLEX MICROSCOPIC
Bilirubin Urine: NEGATIVE
Glucose, UA: NEGATIVE mg/dL
Hgb urine dipstick: NEGATIVE
Ketones, ur: NEGATIVE mg/dL
Leukocytes,Ua: NEGATIVE
Nitrite: NEGATIVE
Protein, ur: NEGATIVE mg/dL
Specific Gravity, Urine: 1.009 (ref 1.005–1.030)
pH: 7 (ref 5.0–8.0)

## 2023-02-02 LAB — PROTEIN / CREATININE RATIO, URINE
Creatinine, Urine: 479 mg/dL
Protein Creatinine Ratio: 0.06 mg/mg{creat} (ref 0.00–0.15)
Total Protein, Urine: 29 mg/dL

## 2023-02-02 LAB — COMPREHENSIVE METABOLIC PANEL
ALT: 12 U/L (ref 0–44)
AST: 14 U/L — ABNORMAL LOW (ref 15–41)
Albumin: 2.6 g/dL — ABNORMAL LOW (ref 3.5–5.0)
Alkaline Phosphatase: 63 U/L (ref 38–126)
Anion gap: 9 (ref 5–15)
BUN: 6 mg/dL (ref 6–20)
CO2: 21 mmol/L — ABNORMAL LOW (ref 22–32)
Calcium: 9.6 mg/dL (ref 8.9–10.3)
Chloride: 108 mmol/L (ref 98–111)
Creatinine, Ser: 0.7 mg/dL (ref 0.44–1.00)
GFR, Estimated: >60 mL/min (ref >60)
Glucose, Bld: 89 mg/dL (ref 70–99)
Potassium: 3.4 mmol/L — ABNORMAL LOW (ref 3.5–5.1)
Sodium: 138 mmol/L (ref 135–145)
Total Bilirubin: 0.3 mg/dL (ref 0.3–1.2)
Total Protein: 5.8 g/dL — ABNORMAL LOW (ref 6.5–8.1)

## 2023-02-02 LAB — CBC
HCT: 32.5 % — ABNORMAL LOW (ref 36.0–46.0)
Hemoglobin: 11 g/dL — ABNORMAL LOW (ref 12.0–15.0)
MCH: 31.3 pg (ref 26.0–34.0)
MCHC: 33.8 g/dL (ref 30.0–36.0)
MCV: 92.6 fL (ref 80.0–100.0)
Platelets: 148 10*3/uL — ABNORMAL LOW (ref 150–400)
RBC: 3.51 MIL/uL — ABNORMAL LOW (ref 3.87–5.11)
RDW: 14.2 % (ref 11.5–15.5)
WBC: 8.9 10*3/uL (ref 4.0–10.5)
nRBC: 0 % (ref 0.0–0.2)

## 2023-02-02 NOTE — MAU Note (Signed)
[name] [name] is a 32 y.o. at [redacted]w[redacted]d here in MAU reporting: has been contracting for about 3 wks. Was put on procardia, last dose 0900. Started having painful contractions yesterday.  Today have gotten closer and stronger, called OB, was told to come in.  No bleeding or leaking.  BP has been high, so they have been monitoring her for pre-E.  Was severely pre-eclamptic with first. Reports +HA (mild), denies visual changes, epigastric pain or increased swelling. Reports +FM Onset of complaint: yesterday Pain score: HA 3, ctxs 5 Vitals:   02/02/23 1455  BP: (!) 139/91  Pulse: 96  Resp: 17  Temp: 97.9 F (36.6 C)  SpO2: 100%     FHT:152 Lab orders placed from triage:  urine

## 2023-02-02 NOTE — MAU Provider Note (Addendum)
History     CSN: [id number]  Arrival date and time: 02/02/23 1415  Chief Complaint  Patient presents with   Contractions   Headache   .  Headache  Associated symptoms include abdominal pain (only with palpation), dizziness and nausea. Pertinent negatives include no vomiting or weakness.   [name] [name] is a 32 yo F G4P0121 at [redacted]w[redacted]d GA presents to the MAU for increasing intensity and shortened intervals of contractions since last night. Her pregnancy is complicated by previous severe Pre-E and preterm labor at 36 weeks in her last pregnancy, POTS, maternal obesity, depression/anxiety, NASH and CHTN. She states last night her contractions were increasing in pain and started experiencing them consistently about Q5-10 minutes  She also endorses pre-eclampsia sx with a new HA starting last night, dizziness, vision changes last week but not recently, and unchanged upper and lower extremity edema. She also admits to bad heartburn pain prior to painful contractions but denies CP. She experienced nausea w/o vomiting last night but was relieved by Zofran. She denies CP, palpitations, SOB, RUQ pain, and acute visual changes. She also denies VB, LOF. She reports +FM.   She is currently on a regimen of Procardia 90 mg daily and recently had her Labetalol increased to 100 mg QID. She was recently switched from Propranolol to Labetalol.  She took 60 mg Procardia at 0900 and 200 mg of Labetalol today. She has not been checking her BP at home too often but has been reportedly okay. She was given a course of Betamethasone on 01/16/23.  OB History     Gravida  4   Para  1   Term      Preterm  1   AB  2   Living  1      SAB  2   IAB      Ectopic      Multiple      Live Births  1           Past Medical History:  Diagnosis Date   Anxiety    Asthma    Depression    EDS (Ehlers-Danlos syndrome)    Hyperlipidemia    Migraine    Ovarian cyst    POTS (postural orthostatic  tachycardia syndrome)    POTS (postural orthostatic tachycardia syndrome)    Preeclampsia, severe    Transaminitis    Vitamin B12 deficiency    Vitamin D deficiency     Past Surgical History:  Procedure Laterality Date   CESAREAN SECTION     EYE SURGERY     x2   WISDOM TOOTH EXTRACTION      Family History  Problem Relation Age of Onset   Hypertension Mother    Anxiety disorder Mother    Other Father        unknown   Osteoarthritis Paternal Grandmother     Social History   Tobacco Use   Smoking status: Never   Smokeless tobacco: Never  Vaping Use   Vaping status: Never Used  Substance Use Topics   Alcohol use: Not Currently   Drug use: Not Currently    Allergies:  Allergies  Allergen Reactions   Amphetamine-Dextroamphetamine Other (See Comments)   Fenofibrate Other (See Comments)   Ritalin [Methylphenidate] Other (See Comments)   Tape Dermatitis    Tearing of skin   Wound Dressing Adhesive Dermatitis    Tearing of skin    No medications prior to admission.   ROS reviewed and pertinent positives and  negatives as documented in HPI.   Physical Exam   Blood pressure (!) 137/92, pulse 82, temperature 97.9 F (36.6 C), temperature source Oral, resp. rate 17, height 4\' 9"  (1.448 m), weight 86.4 kg, last menstrual period 06/12/2022, SpO2 97%, currently breastfeeding.  Physical Exam Exam conducted with a chaperone present.  Constitutional:      Appearance: She is well-developed. She is not ill-appearing.  HENT:     Head: Normocephalic and atraumatic.  Eyes:     Extraocular Movements: Extraocular movements intact.  Cardiovascular:     Rate and Rhythm: Normal rate and regular rhythm.     Heart sounds: Normal heart sounds. No murmur heard. Pulmonary:     Effort: Pulmonary effort is normal. No respiratory distress.     Breath sounds: Normal breath sounds. No wheezing.  Abdominal:     Tenderness: Tenderness: mild RUQ tenderness.     Comments: Negative  murphy's sign  Genitourinary:    Cervix: No cervical bleeding.     Comments: Cervical os appears closed and low Skin:    General: Skin is warm and dry.     Capillary Refill: Capillary refill takes less than 2 seconds.  Neurological:     Mental Status: She is alert.  Psychiatric:        Mood and Affect: Mood normal.        Behavior: Behavior normal.    EFM: 150/mod/+a/-d Toco: every 4-10 min, irregular  MAU Course  Procedures  MDM - Cervical exam - Labs ordered: CBC, CMP, PC, UA - Transvaginal US  Assessment and Plan  [name] [name] is a 32 yo F G4P1 at [redacted]w[redacted]d GA who presents for increasing painful contractions. Her contractions were about Q5-10 min last night, now occurring Q3-10 min. Denies VB, LOF, CP, SOB. Reports +FM.   Preterm contractions: Known hx preterm ctx, seen in MAU for the same, s/p BMZ with last dose 10/12  on Procardia for cHTN as below  cx unchanged despite ctx pattern which was irregular as above  CL 5.4cm  discussed results with patient -- given closed cx and CL low likelihood that ctx pattern now c/w early labor  ok for d/c -- discussed in detail return precautions with pt and her partner  cHTN: BP mild range today, PCR neg, PLT at baseline (~150s), Cr and LFTs ok  no si/sx severe pre-e  cont current regimen, further titration per primary OB  stable for d/c  Earlie Server, [employer]  Attestation of Supervision of Student:  I confirm that I have verified the information documented in the physician assistant student's note and that I have also personally performed the history, physical exam and all medical decision making activities.  I have verified that all services and findings are accurately documented in this student's note; and I agree with management and plan as outlined in the documentation. I have also made any necessary editorial changes.   Sundra Aland, MD [facility] 02/02/2023 8:31 PM

## 2023-02-08 ENCOUNTER — Other Ambulatory Visit: Payer: Self-pay

## 2023-02-08 MED ORDER — RSV PRE-FUSION F A&B VAC RCMB 120 MCG/0.5ML IM SOLR
0.5000 mL | Freq: Once | INTRAMUSCULAR | 0 refills | Status: AC
  Filled 2023-02-08: qty 0.5, 1d supply, fill #0

## 2023-02-11 ENCOUNTER — Inpatient Hospital Stay (HOSPITAL_COMMUNITY)
Admission: AD | Admit: 2023-02-11 | Discharge: 2023-02-11 | Disposition: A | Discharge status: Home / Self Care | Payer: Medicaid Other | Attending: Family Medicine | Admitting: Family Medicine

## 2023-02-11 ENCOUNTER — Encounter (HOSPITAL_COMMUNITY): Payer: Self-pay | Admitting: Family Medicine

## 2023-02-11 DIAGNOSIS — O113 Pre-existing hypertension with pre-eclampsia, third trimester: Secondary | ICD-10-CM | POA: Diagnosis present

## 2023-02-11 DIAGNOSIS — Z8249 Family history of ischemic heart disease and other diseases of the circulatory system: Secondary | ICD-10-CM | POA: Insufficient documentation

## 2023-02-11 DIAGNOSIS — Z3A34 34 weeks gestation of pregnancy: Secondary | ICD-10-CM | POA: Diagnosis not present

## 2023-02-11 DIAGNOSIS — Z79899 Other long term (current) drug therapy: Secondary | ICD-10-CM | POA: Diagnosis not present

## 2023-02-11 DIAGNOSIS — O26893 Other specified pregnancy related conditions, third trimester: Secondary | ICD-10-CM | POA: Insufficient documentation

## 2023-02-11 DIAGNOSIS — I10 Essential (primary) hypertension: Secondary | ICD-10-CM | POA: Diagnosis not present

## 2023-02-11 LAB — COMPREHENSIVE METABOLIC PANEL
ALT: 13 U/L (ref 0–44)
AST: 17 U/L (ref 15–41)
Albumin: 2.7 g/dL — ABNORMAL LOW (ref 3.5–5.0)
Alkaline Phosphatase: 67 U/L (ref 38–126)
Anion gap: 9 (ref 5–15)
BUN: <5 mg/dL — ABNORMAL LOW (ref 6–20)
CO2: 18 mmol/L — ABNORMAL LOW (ref 22–32)
Calcium: 9.5 mg/dL (ref 8.9–10.3)
Chloride: 108 mmol/L (ref 98–111)
Creatinine, Ser: 0.61 mg/dL (ref 0.44–1.00)
GFR, Estimated: >60 mL/min (ref >60)
Glucose, Bld: 102 mg/dL — ABNORMAL HIGH (ref 70–99)
Potassium: 3.4 mmol/L — ABNORMAL LOW (ref 3.5–5.1)
Sodium: 135 mmol/L (ref 135–145)
Total Bilirubin: 0.4 mg/dL (ref <1.2)
Total Protein: 6 g/dL — ABNORMAL LOW (ref 6.5–8.1)

## 2023-02-11 LAB — CBC WITH DIFFERENTIAL/PLATELET
Abs Immature Granulocytes: 0.06 10*3/uL (ref 0.00–0.07)
Basophils Absolute: 0 10*3/uL (ref 0.0–0.1)
Basophils Relative: 0 %
Eosinophils Absolute: 0.1 10*3/uL (ref 0.0–0.5)
Eosinophils Relative: 1 %
HCT: 32.8 % — ABNORMAL LOW (ref 36.0–46.0)
Hemoglobin: 11.2 g/dL — ABNORMAL LOW (ref 12.0–15.0)
Immature Granulocytes: 1 %
Lymphocytes Relative: 22 %
Lymphs Abs: 1.9 10*3/uL (ref 0.7–4.0)
MCH: 31.1 pg (ref 26.0–34.0)
MCHC: 34.1 g/dL (ref 30.0–36.0)
MCV: 91.1 fL (ref 80.0–100.0)
Monocytes Absolute: 0.5 10*3/uL (ref 0.1–1.0)
Monocytes Relative: 6 %
Neutro Abs: 6.2 10*3/uL (ref 1.7–7.7)
Neutrophils Relative %: 70 %
Platelets: 179 10*3/uL (ref 150–400)
RBC: 3.6 MIL/uL — ABNORMAL LOW (ref 3.87–5.11)
RDW: 13.9 % (ref 11.5–15.5)
WBC: 8.8 10*3/uL (ref 4.0–10.5)
nRBC: 0 % (ref 0.0–0.2)

## 2023-02-11 LAB — URINALYSIS, ROUTINE W REFLEX MICROSCOPIC
Bilirubin Urine: NEGATIVE
Glucose, UA: NEGATIVE mg/dL
Hgb urine dipstick: NEGATIVE
Ketones, ur: NEGATIVE mg/dL
Leukocytes,Ua: NEGATIVE
Nitrite: NEGATIVE
Protein, ur: NEGATIVE mg/dL
Specific Gravity, Urine: 1.004 — ABNORMAL LOW (ref 1.005–1.030)
pH: 7 (ref 5.0–8.0)

## 2023-02-11 LAB — PROTEIN / CREATININE RATIO, URINE
Creatinine, Urine: 31 mg/dL
Protein Creatinine Ratio: 0.26 mg/mg{creat} — ABNORMAL HIGH (ref 0.00–0.15)
Total Protein, Urine: 8 mg/dL

## 2023-02-11 LAB — WET PREP, GENITAL
Clue Cells Wet Prep HPF POC: NONE SEEN
Sperm: NONE SEEN
Trich, Wet Prep: NONE SEEN
WBC, Wet Prep HPF POC: <10 (ref <10)
Yeast Wet Prep HPF POC: NONE SEEN

## 2023-02-11 NOTE — MAU Note (Addendum)
.  [name] [name] is a 32 y.o. at [redacted]w[redacted]d here in MAU reporting: Coming from home reporting severe range BP's throughout the day today. Denies HA, visual disturbances, edema, and RUQ/epigastric pain. Reports occasional CTX's. Denies VB or LOF. +FM.  Last doses of BP meds: Labetalol 100 mg at 1715 Procardia 60 mg this AM and then took her 30 mg dose 1715  Onset of complaint: Today  Pain score: 4/10 CTX's  FHT: 145 initial external Lab orders placed from triage: UA

## 2023-02-11 NOTE — MAU Provider Note (Signed)
History   Ruqayya Ventress , a  32 y.o. [phone] at [redacted]w[redacted]d presents to MAU for c/o contractions and HBP readings at home and was advised to report to triage, per her providers request. Patient denies visual changes, RUQ pain or a HA, but suffers from chronic migraines, HBP and hypotension, dt/ POTS, at baseline on meds. Patient reports painful contractions last night approximately 7-10 minutes apart and denies painful contractions today. She denies abnormal vaginal discharge, VB, LOF and reports good FM. She also reports some nausea with contractions, had 2 liquid BM's today, but denies any vomiting. Patient reports some chest tightness while outside with her daughter earlier today, which was ultimately relieved with rest. Patient currently denies chest pain and dyspnea.     CSN: [id number]  Arrival date and time: 02/11/23 1742   Event Date/Time   First Provider Initiated Contact with Patient 02/11/23 1840      Chief Complaint  Patient presents with   Hypertension   Hypertension Pertinent negatives include no chest pain, headaches or shortness of breath.    OB History     Gravida  4   Para  1   Term      Preterm  1   AB  2   Living  1      SAB  2   IAB      Ectopic      Multiple      Live Births  1           Past Medical History:  Diagnosis Date   Anxiety    Asthma    Depression    EDS (Ehlers-Danlos syndrome)    Hyperlipidemia    Migraine    Ovarian cyst    POTS (postural orthostatic tachycardia syndrome)    POTS (postural orthostatic tachycardia syndrome)    Preeclampsia, severe    Transaminitis    Vitamin B12 deficiency    Vitamin D deficiency     Past Surgical History:  Procedure Laterality Date   CESAREAN SECTION     EYE SURGERY     x2   WISDOM TOOTH EXTRACTION      Family History  Problem Relation Age of Onset   Hypertension Mother    Anxiety disorder Mother    Other Father        unknown   Osteoarthritis Paternal Grandmother      Social History   Tobacco Use   Smoking status: Never   Smokeless tobacco: Never  Vaping Use   Vaping status: Never Used  Substance Use Topics   Alcohol use: Not Currently   Drug use: Not Currently    Allergies:  Allergies  Allergen Reactions   Amphetamine-Dextroamphetamine Other (See Comments)   Fenofibrate Other (See Comments)   Ritalin [Methylphenidate] Other (See Comments)   Tape Dermatitis    Tearing of skin   Wound Dressing Adhesive Dermatitis    Tearing of skin    Medications Prior to Admission  Medication Sig Dispense Refill Last Dose   Azelastine-Fluticasone 137-50 MCG/ACT SUSP Place 1 spray into the nose 2 (two) times a day.   Past Week   Botulinum Toxin Type A (BOTOX) 200 units SOLR Inject as directed every 3 (three) months.   Past Month   cetirizine (ZYRTEC) 10 MG tablet Take 10 mg by mouth daily.   02/11/2023   cyclobenzaprine (FLEXERIL) 5 MG tablet Take 1-2 tablets (5-10 mg total) by mouth 3 (three) times daily as needed for muscle spasms. 30 tablet 0  Past Week   fluticasone (FLONASE) 50 MCG/ACT nasal spray Place 1 spray into both nostrils daily.   02/10/2023   folic acid (FOLVITE) 1 MG tablet Take 1 mg by mouth daily.   02/11/2023   labetalol (NORMODYNE) 200 MG tablet Take 200 mg by mouth 2 (two) times daily.   02/11/2023   NIFEdipine (PROCARDIA XL) 30 MG 24 hr tablet Take 3 tablets (90 mg total) by mouth daily. 90 tablet 0 02/11/2023   ondansetron (ZOFRAN-ODT) 8 MG disintegrating tablet Take by mouth daily.   Past Week   sertraline (ZOLOFT) 25 MG tablet Take 50 mg by mouth daily.    02/11/2023   acetaminophen (TYLENOL) 500 MG tablet Take 500 mg by mouth every 6 (six) hours as needed for headache.      Cyanocobalamin 1000 MCG/ML KIT Inject 1 mL as directed every 30 (thirty) days.      metoCLOPramide (REGLAN) 10 MG tablet Take 1 tablet (10 mg total) by mouth every 6 (six) hours as needed for nausea. 30 tablet 0    Prenatal Multivit-Min-Fe-FA (PRE-NATAL PO) Take by  mouth.       Review of Systems  Respiratory:  Negative for chest tightness and shortness of breath.   Cardiovascular:  Positive for leg swelling. Negative for chest pain.  Gastrointestinal:  Positive for abdominal pain, diarrhea and nausea. Negative for vomiting.  Genitourinary:  Negative for dysuria, vaginal bleeding and vaginal discharge.  Neurological:  Negative for dizziness and headaches.   Physical Exam   Blood pressure (!) 137/97, pulse 91, temperature 97.9 F (36.6 C), temperature source Oral, resp. rate 16, last menstrual period 06/12/2022, SpO2 98%, currently breastfeeding.  Physical Exam Constitutional:      General: She is not in acute distress.    Appearance: She is not ill-appearing.  Pulmonary:     Effort: Pulmonary effort is normal.  Genitourinary:    General: Normal vulva.     Vagina: No vaginal discharge.  Musculoskeletal:     Right lower leg: Edema present.     Left lower leg: Edema present.     Comments: Nonpitting  Skin:    General: Skin is warm and dry.  Neurological:     Mental Status: She is oriented to person, place, and time.  Psychiatric:        Mood and Affect: Mood normal.        Behavior: Behavior normal.     MAU Course  Procedures Orders Placed This Encounter  Procedures   Wet prep, genital   Urinalysis, Routine w reflex microscopic -Urine, Clean Catch   CBC with Differential/Platelet   Comprehensive metabolic panel   Protein / creatinine ratio, urine    Results for orders placed or performed during the hospital encounter of 02/11/23 (from the past 24 hour(s))  Urinalysis, Routine w reflex microscopic -Urine, Clean Catch     Status: Abnormal   Collection Time: 02/11/23  6:20 PM  Result Value Ref Range   Color, Urine STRAW (A) YELLOW   APPearance CLEAR CLEAR   Specific Gravity, Urine 1.004 (L) 1.005 - 1.030   pH 7.0 5.0 - 8.0   Glucose, UA NEGATIVE NEGATIVE mg/dL   Hgb urine dipstick NEGATIVE NEGATIVE   Bilirubin Urine NEGATIVE  NEGATIVE   Ketones, ur NEGATIVE NEGATIVE mg/dL   Protein, ur NEGATIVE NEGATIVE mg/dL   Nitrite NEGATIVE NEGATIVE   Leukocytes,Ua NEGATIVE NEGATIVE  Protein / creatinine ratio, urine     Status: Abnormal   Collection Time: 02/11/23  6:20  PM  Result Value Ref Range   Creatinine, Urine 31 mg/dL   Total Protein, Urine 8 mg/dL   Protein Creatinine Ratio 0.26 (H) 0.00 - 0.15 mg/mg[Cre]  CBC with Differential/Platelet     Status: Abnormal   Collection Time: 02/11/23  6:39 PM  Result Value Ref Range   WBC 8.8 4.0 - 10.5 K/uL   RBC 3.60 (L) 3.87 - 5.11 MIL/uL   Hemoglobin 11.2 (L) 12.0 - 15.0 g/dL   HCT 52.8 (L) 41.3 - 24.4 %   MCV 91.1 80.0 - 100.0 fL   MCH 31.1 26.0 - 34.0 pg   MCHC 34.1 30.0 - 36.0 g/dL   RDW 01.0 27.2 - 53.6 %   Platelets 179 150 - 400 K/uL   nRBC 0.0 0.0 - 0.2 %   Neutrophils Relative % 70 %   Neutro Abs 6.2 1.7 - 7.7 K/uL   Lymphocytes Relative 22 %   Lymphs Abs 1.9 0.7 - 4.0 K/uL   Monocytes Relative 6 %   Monocytes Absolute 0.5 0.1 - 1.0 K/uL   Eosinophils Relative 1 %   Eosinophils Absolute 0.1 0.0 - 0.5 K/uL   Basophils Relative 0 %   Basophils Absolute 0.0 0.0 - 0.1 K/uL   Immature Granulocytes 1 %   Abs Immature Granulocytes 0.06 0.00 - 0.07 K/uL  Comprehensive metabolic panel     Status: Abnormal   Collection Time: 02/11/23  6:39 PM  Result Value Ref Range   Sodium 135 135 - 145 mmol/L   Potassium 3.4 (L) 3.5 - 5.1 mmol/L   Chloride 108 98 - 111 mmol/L   CO2 18 (L) 22 - 32 mmol/L   Glucose, Bld 102 (H) 70 - 99 mg/dL   BUN <5 (L) 6 - 20 mg/dL   Creatinine, Ser 6.44 0.44 - 1.00 mg/dL   Calcium 9.5 8.9 - 03.4 mg/dL   Total Protein 6.0 (L) 6.5 - 8.1 g/dL   Albumin 2.7 (L) 3.5 - 5.0 g/dL   AST 17 15 - 41 U/L   ALT 13 0 - 44 U/L   Alkaline Phosphatase 67 38 - 126 U/L   Total Bilirubin 0.4 <1.2 mg/dL   GFR, Estimated >74 >25 mL/min   Anion gap 9 5 - 15  Wet prep, genital     Status: None   Collection Time: 02/11/23  7:53 PM  Result Value Ref  Range   Yeast Wet Prep HPF POC NONE SEEN NONE SEEN   Trich, Wet Prep NONE SEEN NONE SEEN   Clue Cells Wet Prep HPF POC NONE SEEN NONE SEEN   WBC, Wet Prep HPF POC <10 <10   Sperm NONE SEEN     MDM -Severe range BP at home recorded with home BP cuff & pt reports it being previously calibrated at least 3 times. Highest reading at home was 176/108 but most were 160s/90s -Denies preE sx's at this time -CBC, CMP, Prot/Creat ratio, UA & wetprep in triage -Discharge home Assessment and Plan  Chronic hypertension -PreE labs unremarkable -SBP in triage 130s-140s/DBP 90s -Patient took her Procardia and Labetalol prior to arrival and reports will take one more dose of 100mg  Labetalol prior to bed  Contractions in the third trimester -NST appropriate for GA -uterine irritability noted -VE: cervix posterior and closed -PO hydration in triage -Wetprep & UA negative  Discharged home in stable condition, with return precautions reviewed and given.  Darrell Jewel, SNM 02/11/2023, 8:51 PM

## 2023-02-16 NOTE — Patient Instructions (Signed)
Francis Poinsett  02/16/2023   Your procedure is scheduled on:  03/01/2023  Arrive at 1015 at Entrance C on [address] at [facility]  and [employer]. You are invited to use the FREE valet parking or use the Visitor's parking deck.  Pick up the phone at the desk and dial [phone].  Call this number if you have problems the morning of surgery: [phone]  Remember:   Do not eat food:(After Midnight) Desps de medianoche.  You may drink clear liquids until arrival at __1015___.  Clear liquids means a liquid you can see thru.  It can have color such as Cola or Kool aid.  Tea is OK and coffee as long as no milk or creamer of any kind.  Take these medicines the morning of surgery with A SIP OF WATER:  Take labetalol, Nifedipine and zoloft as prescribed   Do not wear jewelry, make-up or nail polish.  Do not wear lotions, powders, or perfumes. Do not wear deodorant.  Do not shave 48 hours prior to surgery.  Do not bring valuables to the hospital.  [facility] is not   responsible for any belongings or valuables brought to the hospital.  Contacts, dentures or bridgework may not be worn into surgery.  Leave suitcase in the car. After surgery it may be brought to your room.  For patients admitted to the hospital, checkout time is 11:00 AM the day of              discharge.      Please read over the following fact sheets that you were given:     Preparing for Surgery

## 2023-02-17 ENCOUNTER — Encounter (HOSPITAL_COMMUNITY): Payer: Self-pay

## 2023-02-17 ENCOUNTER — Telehealth (HOSPITAL_COMMUNITY): Payer: Self-pay | Admitting: *Deleted

## 2023-02-17 NOTE — Telephone Encounter (Signed)
Preadmission screen  

## 2023-02-18 ENCOUNTER — Encounter (HOSPITAL_COMMUNITY): Payer: Self-pay

## 2023-02-24 ENCOUNTER — Encounter (HOSPITAL_COMMUNITY): Payer: Self-pay | Admitting: Family Medicine

## 2023-02-24 ENCOUNTER — Encounter (HOSPITAL_COMMUNITY)
Admission: AD | Disposition: A | Discharge status: Home / Self Care | Payer: Self-pay | Source: Home / Self Care | Attending: Obstetrics and Gynecology

## 2023-02-24 ENCOUNTER — Other Ambulatory Visit: Payer: Self-pay

## 2023-02-24 ENCOUNTER — Inpatient Hospital Stay (HOSPITAL_COMMUNITY): Payer: Medicaid Other | Admitting: Anesthesiology

## 2023-02-24 ENCOUNTER — Inpatient Hospital Stay (HOSPITAL_COMMUNITY)
Admission: AD | Admit: 2023-02-24 | Discharge: 2023-02-28 | DRG: 784 | Disposition: A | Discharge status: Home / Self Care | Payer: Medicaid Other | Attending: Obstetrics and Gynecology | Admitting: Obstetrics and Gynecology

## 2023-02-24 DIAGNOSIS — Z818 Family history of other mental and behavioral disorders: Secondary | ICD-10-CM | POA: Diagnosis not present

## 2023-02-24 DIAGNOSIS — O1092 Unspecified pre-existing hypertension complicating childbirth: Secondary | ICD-10-CM | POA: Diagnosis present

## 2023-02-24 DIAGNOSIS — O3663X0 Maternal care for excessive fetal growth, third trimester, not applicable or unspecified: Secondary | ICD-10-CM | POA: Diagnosis present

## 2023-02-24 DIAGNOSIS — O119 Pre-existing hypertension with pre-eclampsia, unspecified trimester: Secondary | ICD-10-CM | POA: Diagnosis present

## 2023-02-24 DIAGNOSIS — Z3A36 36 weeks gestation of pregnancy: Secondary | ICD-10-CM

## 2023-02-24 DIAGNOSIS — Z7982 Long term (current) use of aspirin: Secondary | ICD-10-CM

## 2023-02-24 DIAGNOSIS — O10913 Unspecified pre-existing hypertension complicating pregnancy, third trimester: Secondary | ICD-10-CM | POA: Diagnosis not present

## 2023-02-24 DIAGNOSIS — O99214 Obesity complicating childbirth: Secondary | ICD-10-CM | POA: Diagnosis present

## 2023-02-24 DIAGNOSIS — D62 Acute posthemorrhagic anemia: Secondary | ICD-10-CM | POA: Diagnosis not present

## 2023-02-24 DIAGNOSIS — O10919 Unspecified pre-existing hypertension complicating pregnancy, unspecified trimester: Secondary | ICD-10-CM

## 2023-02-24 DIAGNOSIS — F32A Depression, unspecified: Secondary | ICD-10-CM | POA: Diagnosis present

## 2023-02-24 DIAGNOSIS — F419 Anxiety disorder, unspecified: Secondary | ICD-10-CM | POA: Diagnosis present

## 2023-02-24 DIAGNOSIS — E66813 Obesity, class 3: Secondary | ICD-10-CM | POA: Diagnosis present

## 2023-02-24 DIAGNOSIS — O9952 Diseases of the respiratory system complicating childbirth: Secondary | ICD-10-CM | POA: Diagnosis present

## 2023-02-24 DIAGNOSIS — Z888 Allergy status to other drugs, medicaments and biological substances status: Secondary | ICD-10-CM | POA: Diagnosis not present

## 2023-02-24 DIAGNOSIS — O99344 Other mental disorders complicating childbirth: Secondary | ICD-10-CM | POA: Diagnosis present

## 2023-02-24 DIAGNOSIS — G90A Postural orthostatic tachycardia syndrome (POTS): Secondary | ICD-10-CM | POA: Diagnosis present

## 2023-02-24 DIAGNOSIS — O34211 Maternal care for low transverse scar from previous cesarean delivery: Secondary | ICD-10-CM | POA: Diagnosis present

## 2023-02-24 DIAGNOSIS — O99354 Diseases of the nervous system complicating childbirth: Secondary | ICD-10-CM | POA: Diagnosis present

## 2023-02-24 DIAGNOSIS — Z302 Encounter for sterilization: Secondary | ICD-10-CM | POA: Diagnosis not present

## 2023-02-24 DIAGNOSIS — Z8249 Family history of ischemic heart disease and other diseases of the circulatory system: Secondary | ICD-10-CM | POA: Diagnosis not present

## 2023-02-24 DIAGNOSIS — O1413 Severe pre-eclampsia, third trimester: Secondary | ICD-10-CM | POA: Diagnosis not present

## 2023-02-24 DIAGNOSIS — D509 Iron deficiency anemia, unspecified: Secondary | ICD-10-CM

## 2023-02-24 DIAGNOSIS — Z8659 Personal history of other mental and behavioral disorders: Secondary | ICD-10-CM

## 2023-02-24 DIAGNOSIS — Z883 Allergy status to other anti-infective agents status: Secondary | ICD-10-CM | POA: Diagnosis not present

## 2023-02-24 DIAGNOSIS — Z91048 Other nonmedicinal substance allergy status: Secondary | ICD-10-CM | POA: Diagnosis not present

## 2023-02-24 DIAGNOSIS — O114 Pre-existing hypertension with pre-eclampsia, complicating childbirth: Secondary | ICD-10-CM | POA: Diagnosis present

## 2023-02-24 DIAGNOSIS — J45909 Unspecified asthma, uncomplicated: Secondary | ICD-10-CM | POA: Diagnosis present

## 2023-02-24 DIAGNOSIS — Z98891 History of uterine scar from previous surgery: Secondary | ICD-10-CM

## 2023-02-24 DIAGNOSIS — O1414 Severe pre-eclampsia complicating childbirth: Secondary | ICD-10-CM | POA: Diagnosis not present

## 2023-02-24 LAB — COMPREHENSIVE METABOLIC PANEL WITH GFR
ALT: 13 U/L (ref 0–44)
AST: 18 U/L (ref 15–41)
Albumin: 2.8 g/dL — ABNORMAL LOW (ref 3.5–5.0)
Alkaline Phosphatase: 87 U/L (ref 38–126)
Anion gap: 9 (ref 5–15)
BUN: 5 mg/dL — ABNORMAL LOW (ref 6–20)
CO2: 18 mmol/L — ABNORMAL LOW (ref 22–32)
Calcium: 9.4 mg/dL (ref 8.9–10.3)
Chloride: 109 mmol/L (ref 98–111)
Creatinine, Ser: 0.59 mg/dL (ref 0.44–1.00)
GFR, Estimated: >60 mL/min
Glucose, Bld: 87 mg/dL (ref 70–99)
Potassium: 3.9 mmol/L (ref 3.5–5.1)
Sodium: 136 mmol/L (ref 135–145)
Total Bilirubin: 0.5 mg/dL
Total Protein: 6.4 g/dL — ABNORMAL LOW (ref 6.5–8.1)

## 2023-02-24 LAB — CBC
HCT: 36.1 % (ref 36.0–46.0)
Hemoglobin: 12.4 g/dL (ref 12.0–15.0)
MCH: 31.3 pg (ref 26.0–34.0)
MCHC: 34.3 g/dL (ref 30.0–36.0)
MCV: 91.2 fL (ref 80.0–100.0)
Platelets: 153 10*3/uL (ref 150–400)
RBC: 3.96 MIL/uL (ref 3.87–5.11)
RDW: 13.3 % (ref 11.5–15.5)
WBC: 8.9 10*3/uL (ref 4.0–10.5)
nRBC: 0 % (ref 0.0–0.2)

## 2023-02-24 LAB — TYPE AND SCREEN
ABO/RH(D): O POS
Antibody Screen: NEGATIVE

## 2023-02-24 SURGERY — Surgical Case
Anesthesia: Spinal | Site: Abdomen | Laterality: Bilateral

## 2023-02-24 MED ORDER — PRENATAL MULTIVITAMIN CH
1.0000 | ORAL_TABLET | Freq: Every day | ORAL | Status: DC
  Administered 2023-02-24 – 2023-02-28 (×5): 1 via ORAL
  Filled 2023-02-24 (×5): qty 1

## 2023-02-24 MED ORDER — SUMATRIPTAN SUCCINATE 50 MG PO TABS: 50.0000 mg | ORAL_TABLET | ORAL | Status: DC | PRN

## 2023-02-24 MED ORDER — SCOPOLAMINE 1 MG/3DAYS TD PT72: 1.0000 | MEDICATED_PATCH | Freq: Once | TRANSDERMAL | Status: DC

## 2023-02-24 MED ORDER — FENTANYL CITRATE (PF) 100 MCG/2ML IJ SOLN
INTRAMUSCULAR | Status: DC | PRN
  Administered 2023-02-24: 15 ug via INTRATHECAL

## 2023-02-24 MED ORDER — DIPHENHYDRAMINE HCL 25 MG PO CAPS: 25.0000 mg | ORAL_CAPSULE | ORAL | Status: DC | PRN

## 2023-02-24 MED ORDER — EPHEDRINE 5 MG/ML INJ
INTRAVENOUS | Status: AC
Start: 2023-02-24 — End: ?
  Filled 2023-02-24: qty 5

## 2023-02-24 MED ORDER — KETOROLAC TROMETHAMINE 30 MG/ML IJ SOLN: 30.0000 mg | Freq: Once | INTRAMUSCULAR | Status: DC | PRN

## 2023-02-24 MED ORDER — DEXAMETHASONE SODIUM PHOSPHATE 10 MG/ML IJ SOLN
INTRAMUSCULAR | Status: DC | PRN
  Administered 2023-02-24: 10 mg via INTRAVENOUS

## 2023-02-24 MED ORDER — COCONUT OIL OIL
1.0000 | TOPICAL_OIL | Status: DC | PRN
  Administered 2023-02-24: 1 via TOPICAL

## 2023-02-24 MED ORDER — ONDANSETRON HCL 4 MG/2ML IJ SOLN
INTRAMUSCULAR | Status: DC | PRN
  Administered 2023-02-24: 4 mg via INTRAVENOUS

## 2023-02-24 MED ORDER — HYDROMORPHONE HCL 1 MG/ML IJ SOLN: 0.2000 mg | INTRAMUSCULAR | Status: DC | PRN

## 2023-02-24 MED ORDER — ONDANSETRON HCL 4 MG/2ML IJ SOLN: 4.0000 mg | Freq: Once | INTRAMUSCULAR | Status: DC | PRN

## 2023-02-24 MED ORDER — ACETAMINOPHEN 500 MG PO TABS
1000.0000 mg | ORAL_TABLET | Freq: Four times a day (QID) | ORAL | Status: DC
  Administered 2023-02-24 – 2023-02-28 (×15): 1000 mg via ORAL
  Filled 2023-02-24 (×15): qty 2

## 2023-02-24 MED ORDER — NALOXONE HCL 4 MG/10ML IJ SOLN: 1.0000 ug/kg/h | INTRAVENOUS | Status: DC | PRN

## 2023-02-24 MED ORDER — LACTATED RINGERS IV SOLN: Freq: Once | INTRAVENOUS | Status: AC

## 2023-02-24 MED ORDER — FENTANYL CITRATE (PF) 100 MCG/2ML IJ SOLN
50.0000 ug | Freq: Once | INTRAMUSCULAR | Status: AC
  Administered 2023-02-24: 50 ug via INTRAVENOUS
  Filled 2023-02-24: qty 2

## 2023-02-24 MED ORDER — ACETAMINOPHEN 500 MG PO TABS
1000.0000 mg | ORAL_TABLET | Freq: Four times a day (QID) | ORAL | Status: DC
  Administered 2023-02-24: 1000 mg via ORAL
  Filled 2023-02-24: qty 2

## 2023-02-24 MED ORDER — ZOLPIDEM TARTRATE 5 MG PO TABS: 5.0000 mg | ORAL_TABLET | Freq: Every evening | ORAL | Status: DC | PRN

## 2023-02-24 MED ORDER — ACETAMINOPHEN 10 MG/ML IV SOLN
INTRAVENOUS | Status: DC | PRN
  Administered 2023-02-24: 1000 mg via INTRAVENOUS

## 2023-02-24 MED ORDER — DEXAMETHASONE SODIUM PHOSPHATE 10 MG/ML IJ SOLN
INTRAMUSCULAR | Status: AC
  Filled 2023-02-24: qty 3

## 2023-02-24 MED ORDER — OXYCODONE HCL 5 MG PO TABS: 5.0000 mg | ORAL_TABLET | Freq: Once | ORAL | Status: DC | PRN

## 2023-02-24 MED ORDER — MAGNESIUM HYDROXIDE 400 MG/5ML PO SUSP
30.0000 mL | ORAL | Status: DC | PRN
Start: 2023-02-24 — End: 2023-02-28

## 2023-02-24 MED ORDER — FAMOTIDINE 20 MG PO TABS
10.0000 mg | ORAL_TABLET | Freq: Every day | ORAL | Status: DC
  Filled 2023-02-24: qty 1

## 2023-02-24 MED ORDER — ACETAMINOPHEN 500 MG PO TABS: 1000.0000 mg | ORAL_TABLET | Freq: Four times a day (QID) | ORAL | Status: DC

## 2023-02-24 MED ORDER — MEPERIDINE HCL 25 MG/ML IJ SOLN: 6.2500 mg | INTRAMUSCULAR | Status: DC | PRN

## 2023-02-24 MED ORDER — OXYCODONE HCL 5 MG/5ML PO SOLN: 5.0000 mg | Freq: Once | ORAL | Status: DC | PRN

## 2023-02-24 MED ORDER — KETOROLAC TROMETHAMINE 30 MG/ML IJ SOLN
30.0000 mg | Freq: Four times a day (QID) | INTRAMUSCULAR | Status: AC | PRN
Start: 2023-02-24 — End: 2023-02-25

## 2023-02-24 MED ORDER — LORATADINE 10 MG PO TABS
10.0000 mg | ORAL_TABLET | Freq: Every day | ORAL | Status: DC
  Administered 2023-02-24 – 2023-02-28 (×5): 10 mg via ORAL
  Filled 2023-02-24 (×5): qty 1

## 2023-02-24 MED ORDER — KETOROLAC TROMETHAMINE 30 MG/ML IJ SOLN
INTRAMUSCULAR | Status: AC
Start: 2023-02-24 — End: ?
  Filled 2023-02-24: qty 3

## 2023-02-24 MED ORDER — IBUPROFEN 600 MG PO TABS
600.0000 mg | ORAL_TABLET | Freq: Four times a day (QID) | ORAL | Status: AC | PRN
Start: 2023-02-24 — End: 2023-02-27

## 2023-02-24 MED ORDER — PROPOFOL 10 MG/ML IV BOLUS
INTRAVENOUS | Status: AC
  Filled 2023-02-24: qty 20

## 2023-02-24 MED ORDER — LABETALOL HCL 5 MG/ML IV SOLN
80.0000 mg | INTRAVENOUS | Status: DC | PRN
  Administered 2023-02-24: 80 mg via INTRAVENOUS
  Filled 2023-02-24: qty 16

## 2023-02-24 MED ORDER — LABETALOL HCL 5 MG/ML IV SOLN
40.0000 mg | INTRAVENOUS | Status: DC | PRN
  Administered 2023-02-24: 40 mg via INTRAVENOUS
  Filled 2023-02-24: qty 8

## 2023-02-24 MED ORDER — STERILE WATER FOR IRRIGATION IR SOLN
Status: DC | PRN
  Administered 2023-02-24: 1000 mL

## 2023-02-24 MED ORDER — ONDANSETRON HCL 4 MG/2ML IJ SOLN: 4.0000 mg | Freq: Three times a day (TID) | INTRAMUSCULAR | Status: DC | PRN

## 2023-02-24 MED ORDER — FERROUS SULFATE 325 (65 FE) MG PO TABS
325.0000 mg | ORAL_TABLET | Freq: Two times a day (BID) | ORAL | Status: DC
  Administered 2023-02-24 – 2023-02-28 (×8): 325 mg via ORAL
  Filled 2023-02-24 (×9): qty 1

## 2023-02-24 MED ORDER — LACTATED RINGERS IV SOLN: INTRAVENOUS | Status: DC

## 2023-02-24 MED ORDER — MAGNESIUM SULFATE 40 GM/1000ML IV SOLN
2.0000 g/h | INTRAVENOUS | Status: AC
  Administered 2023-02-24 (×2): 2 g/h via INTRAVENOUS
  Filled 2023-02-24 (×2): qty 1000

## 2023-02-24 MED ORDER — DIPHENHYDRAMINE HCL 50 MG/ML IJ SOLN: 12.5000 mg | INTRAMUSCULAR | Status: DC | PRN

## 2023-02-24 MED ORDER — NALOXONE HCL 0.4 MG/ML IJ SOLN: 0.4000 mg | INTRAMUSCULAR | Status: DC | PRN

## 2023-02-24 MED ORDER — MAGNESIUM SULFATE BOLUS VIA INFUSION
4.0000 g | Freq: Once | INTRAVENOUS | Status: AC
  Administered 2023-02-24: 4 g via INTRAVENOUS
  Filled 2023-02-24: qty 1000

## 2023-02-24 MED ORDER — PHENYLEPHRINE 80 MCG/ML (10ML) SYRINGE FOR IV PUSH (FOR BLOOD PRESSURE SUPPORT)
PREFILLED_SYRINGE | INTRAVENOUS | Status: DC | PRN
  Administered 2023-02-24: 160 ug via INTRAVENOUS

## 2023-02-24 MED ORDER — TRANEXAMIC ACID-NACL 1000-0.7 MG/100ML-% IV SOLN
INTRAVENOUS | Status: DC | PRN
  Administered 2023-02-24: 1000 mg via INTRAVENOUS

## 2023-02-24 MED ORDER — ONDANSETRON HCL 4 MG/2ML IJ SOLN
INTRAMUSCULAR | Status: AC
  Filled 2023-02-24: qty 8

## 2023-02-24 MED ORDER — WITCH HAZEL-GLYCERIN EX PADS: 1.0000 | MEDICATED_PAD | CUTANEOUS | Status: DC | PRN

## 2023-02-24 MED ORDER — DIBUCAINE (PERIANAL) 1 % EX OINT: 1.0000 | TOPICAL_OINTMENT | CUTANEOUS | Status: DC | PRN

## 2023-02-24 MED ORDER — SOD CITRATE-CITRIC ACID 500-334 MG/5ML PO SOLN
30.0000 mL | Freq: Once | ORAL | Status: AC
  Administered 2023-02-24: 30 mL via ORAL
  Filled 2023-02-24: qty 30

## 2023-02-24 MED ORDER — ROCURONIUM BROMIDE 10 MG/ML (PF) SYRINGE
PREFILLED_SYRINGE | INTRAVENOUS | Status: AC
  Filled 2023-02-24: qty 10

## 2023-02-24 MED ORDER — LABETALOL HCL 200 MG PO TABS
200.0000 mg | ORAL_TABLET | Freq: Three times a day (TID) | ORAL | Status: DC
  Filled 2023-02-24: qty 1

## 2023-02-24 MED ORDER — MENTHOL 3 MG MT LOZG: 1.0000 | LOZENGE | OROMUCOSAL | Status: DC | PRN

## 2023-02-24 MED ORDER — CEFAZOLIN SODIUM-DEXTROSE 2-4 GM/100ML-% IV SOLN
2.0000 g | INTRAVENOUS | Status: AC
  Administered 2023-02-24: 2 g via INTRAVENOUS
  Filled 2023-02-24: qty 100

## 2023-02-24 MED ORDER — OXYTOCIN-SODIUM CHLORIDE 30-0.9 UT/500ML-% IV SOLN: 2.5000 [IU]/h | INTRAVENOUS | Status: AC

## 2023-02-24 MED ORDER — SODIUM CHLORIDE 0.9 % IR SOLN
Status: DC | PRN
  Administered 2023-02-24: 1

## 2023-02-24 MED ORDER — ARTIFICIAL TEARS OPHTHALMIC OINT
TOPICAL_OINTMENT | OPHTHALMIC | Status: AC
  Filled 2023-02-24: qty 3.5

## 2023-02-24 MED ORDER — DIPHENHYDRAMINE HCL 25 MG PO CAPS: 25.0000 mg | ORAL_CAPSULE | Freq: Four times a day (QID) | ORAL | Status: DC | PRN

## 2023-02-24 MED ORDER — NIFEDIPINE ER OSMOTIC RELEASE 60 MG PO TB24
90.0000 mg | ORAL_TABLET | Freq: Every day | ORAL | Status: DC
  Administered 2023-02-25: 60 mg via ORAL
  Filled 2023-02-24: qty 1

## 2023-02-24 MED ORDER — HYDRALAZINE HCL 20 MG/ML IJ SOLN
10.0000 mg | INTRAMUSCULAR | Status: DC | PRN
  Administered 2023-02-24: 10 mg via INTRAVENOUS
  Filled 2023-02-24 (×2): qty 1

## 2023-02-24 MED ORDER — OXYTOCIN-SODIUM CHLORIDE 30-0.9 UT/500ML-% IV SOLN
INTRAVENOUS | Status: DC | PRN
  Administered 2023-02-24 (×2): 30 [IU] via INTRAVENOUS

## 2023-02-24 MED ORDER — KETOROLAC TROMETHAMINE 30 MG/ML IJ SOLN: 30.0000 mg | Freq: Four times a day (QID) | INTRAMUSCULAR | Status: AC | PRN

## 2023-02-24 MED ORDER — LACTATED RINGERS IV BOLUS: 1000.0000 mL | Freq: Once | INTRAVENOUS | Status: DC

## 2023-02-24 MED ORDER — SIMETHICONE 80 MG PO CHEW: 80.0000 mg | CHEWABLE_TABLET | ORAL | Status: DC | PRN

## 2023-02-24 MED ORDER — SENNOSIDES-DOCUSATE SODIUM 8.6-50 MG PO TABS
2.0000 | ORAL_TABLET | Freq: Every day | ORAL | Status: DC
  Administered 2023-02-25 – 2023-02-28 (×4): 2 via ORAL
  Filled 2023-02-24 (×4): qty 2

## 2023-02-24 MED ORDER — BUPIVACAINE IN DEXTROSE 0.75-8.25 % IT SOLN
INTRATHECAL | Status: DC | PRN
  Administered 2023-02-24: 1.5 mL via INTRATHECAL

## 2023-02-24 MED ORDER — HYDROMORPHONE HCL 1 MG/ML IJ SOLN: 0.2500 mg | INTRAMUSCULAR | Status: DC | PRN

## 2023-02-24 MED ORDER — MORPHINE SULFATE (PF) 0.5 MG/ML IJ SOLN
INTRAMUSCULAR | Status: AC
  Filled 2023-02-24: qty 10

## 2023-02-24 MED ORDER — SERTRALINE HCL 50 MG PO TABS
50.0000 mg | ORAL_TABLET | Freq: Every day | ORAL | Status: DC
  Administered 2023-02-24 – 2023-02-28 (×5): 50 mg via ORAL
  Filled 2023-02-24 (×5): qty 1

## 2023-02-24 MED ORDER — FENTANYL CITRATE (PF) 100 MCG/2ML IJ SOLN
INTRAMUSCULAR | Status: AC
  Filled 2023-02-24: qty 2

## 2023-02-24 MED ORDER — LACTATED RINGERS IV SOLN: INTRAVENOUS | Status: AC

## 2023-02-24 MED ORDER — OXYCODONE HCL 5 MG PO TABS: 5.0000 mg | ORAL_TABLET | ORAL | Status: DC | PRN

## 2023-02-24 MED ORDER — LACTATED RINGERS IV SOLN: INTRAVENOUS | Status: DC | PRN

## 2023-02-24 MED ORDER — LABETALOL HCL 5 MG/ML IV SOLN
20.0000 mg | INTRAVENOUS | Status: DC | PRN
  Administered 2023-02-24 – 2023-02-27 (×2): 20 mg via INTRAVENOUS
  Filled 2023-02-24 (×2): qty 4

## 2023-02-24 MED ORDER — MORPHINE SULFATE (PF) 0.5 MG/ML IJ SOLN
INTRAMUSCULAR | Status: DC | PRN
  Administered 2023-02-24: 150 ug via INTRATHECAL

## 2023-02-24 MED ORDER — ONDANSETRON HCL 4 MG/2ML IJ SOLN
4.0000 mg | Freq: Four times a day (QID) | INTRAMUSCULAR | Status: DC | PRN
  Administered 2023-02-24: 4 mg via INTRAVENOUS
  Filled 2023-02-24: qty 2

## 2023-02-24 MED ORDER — SIMETHICONE 80 MG PO CHEW
80.0000 mg | CHEWABLE_TABLET | Freq: Three times a day (TID) | ORAL | Status: DC
  Administered 2023-02-24 – 2023-02-28 (×12): 80 mg via ORAL
  Filled 2023-02-24 (×13): qty 1

## 2023-02-24 MED ORDER — PHENYLEPHRINE HCL-NACL 20-0.9 MG/250ML-% IV SOLN
INTRAVENOUS | Status: DC | PRN
  Administered 2023-02-24: 60 ug/min via INTRAVENOUS

## 2023-02-24 MED ORDER — FLUTICASONE PROPIONATE 50 MCG/ACT NA SUSP
1.0000 | Freq: Every day | NASAL | Status: DC
  Administered 2023-02-24 – 2023-02-25 (×2): 1 via NASAL
  Filled 2023-02-24: qty 16

## 2023-02-24 MED ORDER — AMISULPRIDE (ANTIEMETIC) 5 MG/2ML IV SOLN: 10.0000 mg | Freq: Once | INTRAVENOUS | Status: DC | PRN

## 2023-02-24 MED ORDER — SODIUM CHLORIDE 0.9% FLUSH: 3.0000 mL | INTRAVENOUS | Status: DC | PRN

## 2023-02-24 SURGICAL SUPPLY — 33 items
BARRIER ADHS 3X4 INTERCEED (GAUZE/BANDAGES/DRESSINGS) IMPLANT
BENZOIN TINCTURE PRP APPL 2/3 (GAUZE/BANDAGES/DRESSINGS) ×1 IMPLANT
CHLORAPREP W/TINT 26ML (MISCELLANEOUS) ×2 IMPLANT
CLAMP UMBILICAL CORD (MISCELLANEOUS) ×1 IMPLANT
CLOTH BEACON ORANGE TIMEOUT ST (SAFETY) ×1 IMPLANT
DERMABOND ADVANCED .7 DNX12 (GAUZE/BANDAGES/DRESSINGS) IMPLANT
DISSECTOR SURG LIGASURE 21 (MISCELLANEOUS) IMPLANT
DRSG OPSITE POSTOP 4X10 (GAUZE/BANDAGES/DRESSINGS) ×1 IMPLANT
ELECT REM PT RETURN 9FT ADLT (ELECTROSURGICAL) ×1
ELECTRODE REM PT RTRN 9FT ADLT (ELECTROSURGICAL) ×1 IMPLANT
EXTRACTOR VACUUM KIWI (MISCELLANEOUS) IMPLANT
GLOVE BIOGEL M 7.0 STRL (GLOVE) ×2 IMPLANT
GLOVE BIOGEL PI IND STRL 7.0 (GLOVE) ×3 IMPLANT
GOWN STRL REUS W/TWL LRG LVL3 (GOWN DISPOSABLE) ×3 IMPLANT
KIT ABG SYR 3ML LUER SLIP (SYRINGE) IMPLANT
MAT PREVALON FULL STRYKER (MISCELLANEOUS) IMPLANT
NDL HYPO 25X5/8 SAFETYGLIDE (NEEDLE) IMPLANT
NEEDLE HYPO 25X5/8 SAFETYGLIDE (NEEDLE) IMPLANT
NS IRRIG 1000ML POUR BTL (IV SOLUTION) ×1 IMPLANT
PACK C SECTION WH (CUSTOM PROCEDURE TRAY) ×1 IMPLANT
PAD OB MATERNITY 4.3X12.25 (PERSONAL CARE ITEMS) ×1 IMPLANT
RTRCTR C-SECT PINK 25CM LRG (MISCELLANEOUS) ×1 IMPLANT
STRIP CLOSURE SKIN 1/2X4 (GAUZE/BANDAGES/DRESSINGS) ×1 IMPLANT
SUT MNCRL 0 VIOLET CTX 36 (SUTURE) ×2 IMPLANT
SUT PDS AB 0 CT1 27 (SUTURE) ×2 IMPLANT
SUT PLAIN 0 NONE (SUTURE) IMPLANT
SUT VIC AB 2-0 CT1 TAPERPNT 27 (SUTURE) ×1 IMPLANT
SUT VIC AB 3-0 SH 27X BRD (SUTURE) IMPLANT
SUT VIC AB 4-0 KS 27 (SUTURE) ×1 IMPLANT
SUTURE PLAIN GUT 2.0 ETHICON (SUTURE) IMPLANT
TOWEL OR 17X24 6PK STRL BLUE (TOWEL DISPOSABLE) ×1 IMPLANT
TRAY FOLEY W/BAG SLVR 14FR LF (SET/KITS/TRAYS/PACK) ×1 IMPLANT
WATER STERILE IRR 1000ML POUR (IV SOLUTION) ×1 IMPLANT

## 2023-02-24 NOTE — MAU Note (Signed)
While at bedside, RN informed MD on medications given to pt thus far and the continuance of severe range blood pressures. No new orders at this time. MD stated to see how pt's blood pressure responds to the magnesium bolus.

## 2023-02-24 NOTE — Lactation Note (Signed)
This note was copied from a baby's chart. Lactation Consultation Note  Patient Name: Boy Lashawna Puffenbarger [id number] Date: 02/24/2023 Age:33 hours Reason for consult: Follow-up assessment;Mother's request;NICU baby;Late-preterm 34-36.6wks  P2- Infant is now in the NICU per FOB. MOB states that she has been pumping every 3 hrs and is upset with how much she is making. MOB is concerned because the first two pumps she collected 5 mL each, the third pump collected 2 mL and the most recent pump she collected nothing. LC reassured MOB that this is normal to see it fluctuate, but that it is important to continue pumping even through the night. MOB stated that she doesn't understand why she is not producing because she had an oversupply with her first child. LC encouraged MOB to not compare her first experience with her second because each birth experience is unique. LC once again reassured MOB that this is normal to see fluctuation.  Once we finished talking about the DEBP, MOB began crying. MOB stated that is was not because of LC, but due to being overwhelmed. LC comforted her and informed her RN. LC encouraged MOB to call for further assistance as needed.  Maternal Data Does the patient have breastfeeding experience prior to this delivery?: Yes  Feeding Mother's Current Feeding Choice: Breast Milk  Lactation Tools Discussed/Used Tools: Pump;Flanges Flange Size: 21 Breast pump type: Double-Electric Breast Pump;Manual Pump Education: Setup, frequency, and cleaning;Milk Storage Reason for Pumping: Infant in NICU Pumping frequency: 15-20 minutes every 3 hrs  Interventions Interventions: Breast feeding basics reviewed;DEBP;Education;LC Services brochure  Discharge Discharge Education: Warning signs for feeding baby  Consult Status Consult Status: NICU follow-up Date: 02/25/23 Follow-up type: In-patient    Dema Severin BS, IBCLC 02/24/2023, 8:50 PM

## 2023-02-24 NOTE — Social Work (Signed)
MOB was referred for history of depression/anxiety.  * Referral screened out by Clinical Social Worker because none of the following criteria appear to apply: ~ History of anxiety/depression during this pregnancy, or of post-partum depression following prior delivery. ~ Diagnosis of anxiety and/or depression within last 3 years OR * MOB's symptoms currently being treated with medication and/or therapy. Per chart review, MOB takes Zoloft 50mg  to treat anxiety and depression symptoms.   Please contact the Clinical Social Worker if needs arise, by [facility] request, or if MOB scores greater than 9/yes to question 10 on [place] Postpartum Depression Screen.  [name] [name], MSW, LCSW Clinical Social Worker  [phone] 25-Jun-2022  3:34 PM

## 2023-02-24 NOTE — Transfer of Care (Signed)
Immediate Anesthesia Transfer of Care Note  Patient: [name] [name]  Procedure(s) Performed: REPEAT CESAREAN SECTION WITH BILATERAL TUBAL LIGATION BY SALPINGECTOMY (Bilateral: Abdomen)  Patient Location: PACU  Anesthesia Type:Spinal  Level of Consciousness: awake and patient cooperative  Airway & Oxygen Therapy: Patient Spontanous Breathing  Post-op Assessment: Report given to RN and Post -op Vital signs reviewed and stable  Post vital signs: Reviewed and stable  Last Vitals:  Vitals Value Taken Time  BP 123/65 02/24/23 0815  Temp    Pulse 85 02/24/23 0817  Resp 11 02/24/23 0817  SpO2 97 % 02/24/23 0817  Vitals shown include unfiled device data.  Last Pain:  Vitals:   02/24/23 0604  TempSrc: Oral  PainSc:          Complications: No notable events documented.

## 2023-02-24 NOTE — MAU Provider Note (Signed)
Chief Complaint:  Contractions   Event Date/Time   First Provider Initiated Contact with Patient 02/24/23 0330     Abdominal Pain The current episode started today. The problem occurs intermittently. The pain is located in the LLQ, RLQ and suprapubic region. The pain is severe. The quality of the pain is cramping. The abdominal pain does not radiate. Pertinent negatives include no diarrhea, dysuria, fever or headaches. Nothing aggravates the pain. The pain is relieved by Nothing. She has tried nothing for the symptoms.    HPI: [name] [name] is a 32 y.o. [phone] at 84w5dwho presents to maternity admissions reporting painful contractions..She has a history of C/S for severe preeclampsia and is planning a repeat C/S.   She reports good fetal movement, denies LOF, vaginal bleeding, or fever/chills.  She denies headache, visual changes or RUQ abdominal pain.  RN note:    [name] [name] is a 31 y.o. at [redacted]w[redacted]d here in MAU reporting: ctx every 3-5 minutes that began at 0100 tonight. Pt denies any LOF or VB. +FM. Pt is scheduled for a repeat C/S.   Onset of complaint: 0100 Pain score: 8/10      Past Medical History: Past Medical History:  Diagnosis Date   Anxiety    Asthma    Depression    EDS (Ehlers-Danlos syndrome)    Hyperlipidemia    Migraine    Ovarian cyst    POTS (postural orthostatic tachycardia syndrome)    POTS (postural orthostatic tachycardia syndrome)    Preeclampsia, severe    Transaminitis    Vitamin B12 deficiency    Vitamin D deficiency     Past obstetric history: OB History  Gravida Para Term Preterm AB Living  4 1   1 2 1   SAB IAB Ectopic Multiple Live Births  2       1    # Outcome Date GA Lbr Len/2nd Weight Sex Type Anes PTL Lv  4 Current           3 SAB 01/2022 [redacted]w[redacted]d         2 SAB 10/2021          1 Preterm 01/2020 [redacted]w[redacted]d    CS-LTranv   LIV    Past Surgical History: Past Surgical History:  Procedure Laterality Date   CESAREAN SECTION     EYE  SURGERY     x2   WISDOM TOOTH EXTRACTION      Family History: Family History  Problem Relation Age of Onset   Hypertension Mother    Anxiety disorder Mother    Other Father        unknown   Osteoarthritis Paternal Grandmother     Social History: Social History   Tobacco Use   Smoking status: Never   Smokeless tobacco: Never  Vaping Use   Vaping status: Never Used  Substance Use Topics   Alcohol use: Not Currently   Drug use: Not Currently    Allergies:  Allergies  Allergen Reactions   Fenofibrate Nausea And Vomiting   Tape Dermatitis    Tearing of skin   Wound Dressing Adhesive Dermatitis    Tearing of skin   Amphetamine-Dextroamphetamine Palpitations   Ritalin [Methylphenidate] Palpitations    Meds:  Medications Prior to Admission  Medication Sig Dispense Refill Last Dose   acetaminophen (TYLENOL) 500 MG tablet Take 1,000 mg by mouth every 6 (six) hours as needed for headache.   02/23/2023   aspirin EC 81 MG tablet Take 81 mg by mouth daily.  Swallow whole.   02/23/2023 at 0900   Calcium Citrate-Vitamin D (CALCIUM + D PO) Take 1 tablet by mouth daily.   02/23/2023 at 0900   cetirizine (ZYRTEC) 10 MG tablet Take 10 mg by mouth daily.   02/23/2023   diphenhydrAMINE (BENADRYL) 25 MG tablet Take 50 mg by mouth every 6 (six) hours as needed (migraines).   02/23/2023   famotidine (PEPCID) 10 MG tablet Take 10 mg by mouth at bedtime.   02/23/2023   fluticasone (FLONASE) 50 MCG/ACT nasal spray Place 1 spray into both nostrils at bedtime.   02/23/2023   folic acid (FOLVITE) 1 MG tablet Take 1 mg by mouth daily.   02/23/2023   labetalol (NORMODYNE) 200 MG tablet Take 200 mg by mouth 3 (three) times daily.   02/23/2023 at 1900   Magnesium 400 MG TABS Take 400 mg by mouth at bedtime.   02/23/2023 at 2000   NIFEdipine (PROCARDIA XL) 30 MG 24 hr tablet Take 3 tablets (90 mg total) by mouth daily. (Patient taking differently: Take 30-60 mg by mouth See admin instructions. Take  60 mg in the morning and 30 mg in the evening) 90 tablet 0 02/23/2023 at 1830   sertraline (ZOLOFT) 50 MG tablet Take 50 mg by mouth daily.   02/23/2023   azelastine (ASTELIN) 0.1 % nasal spray Place 1 spray into both nostrils at bedtime. Use in each nostril as directed      Botulinum Toxin Type A (BOTOX) 200 units SOLR Inject as directed every 3 (three) months.      Coenzyme Q10 (COQ10) 200 MG CAPS Take 200 mg by mouth at bedtime.      Cyanocobalamin 1000 MCG/ML KIT Inject 1 mL as directed every 30 (thirty) days.      metoCLOPramide (REGLAN) 10 MG tablet Take 1 tablet (10 mg total) by mouth every 6 (six) hours as needed for nausea. 30 tablet 0    montelukast (SINGULAIR) 10 MG tablet Take 10 mg by mouth every evening.      ondansetron (ZOFRAN-ODT) 8 MG disintegrating tablet Take 8 mg by mouth every 8 (eight) hours as needed for vomiting or nausea.      Prenatal Multivit-Min-Fe-FA (PRE-NATAL PO) Take 1 tablet by mouth daily.      rizatriptan (MAXALT) 10 MG tablet Take 10 mg by mouth as needed (mild migraines). May repeat in 2 hours if needed      SUMAtriptan (IMITREX) 100 MG tablet Take 50-100 mg by mouth every 2 (two) hours as needed (severe migraines). May repeat in 2 hours if headache persists or recurs.      zolmitriptan (ZOMIG) 5 MG nasal solution Place 1 spray into the nose as needed for migraine.       I have reviewed patient's Past Medical Hx, Surgical Hx, Family Hx, Social Hx, medications and allergies.   ROS:  Review of Systems  Constitutional:  Negative for fever.  Gastrointestinal:  Positive for abdominal pain. Negative for diarrhea.  Genitourinary:  Negative for dysuria.  Neurological:  Negative for headaches.   Other systems negative  Physical Exam  Patient Vitals for the past 24 hrs:  BP Temp Temp src Pulse Resp SpO2 Height Weight  02/24/23 0515 (!) 183/114 -- -- 89 -- -- -- --  02/24/23 0500 (!) 181/106 -- -- 85 -- -- -- --  02/24/23 0448 (!) 182/105 -- -- 86 -- -- -- --   02/24/23 0430 (!) 174/105 -- -- 81 -- -- -- --  02/24/23 0416 Marland Kitchen)  162/104 -- -- 89 -- -- -- --  02/24/23 0400 (!) 163/90 -- -- 89 -- 98 % -- --  02/24/23 0345 (!) 146/93 -- -- 87 -- 98 % -- --  02/24/23 0325 (!) 138/99 98.7 F (37.1 C) Oral 87 20 99 % 4\' 9"  (1.448 m) 87.3 kg   Constitutional: Well-developed, well-nourished female in no acute distress but very painful with contractions.  Cardiovascular: normal rate and rhythm Respiratory: normal effort GI: Abd soft, non-tender, gravid appropriate for gestational age.   No rebound or guarding. MS: Extremities nontender, no edema, normal ROM Neurologic: Alert and oriented x 4.  GU: Neg CVAT.  PELVIC EXAM: Initial Cervix exam 1.5cm/80  Dilation: 2 Effacement (%): 90 Station: -3 Presentation: Vertex Exam by:: [employer], RN  FHT:  Baseline 145 , moderate variability, accelerations present, no decelerations Contractions: q 2-3 mins Irregular  >> later these became regular   Labs: Results for orders placed or performed during the hospital encounter of 02/24/23 (from the past 24 hour(s))  Type and screen [facility]     Status: None (Preliminary result)   Collection Time: 02/24/23  4:10 AM  Result Value Ref Range   ABO/RH(D) PENDING    Antibody Screen PENDING    Sample Expiration      02/27/2023,2359 Performed at [facility], [address] [place], [place] [address]   CBC     Status: None   Collection Time: 02/24/23  4:13 AM  Result Value Ref Range   WBC 8.9 4.0 - 10.5 K/uL   RBC 3.96 3.87 - 5.11 MIL/uL   Hemoglobin 12.4 12.0 - 15.0 g/dL   HCT 78.2 95.6 - 21.3 %   MCV 91.2 80.0 - 100.0 fL   MCH 31.3 26.0 - 34.0 pg   MCHC 34.3 30.0 - 36.0 g/dL   RDW 08.6 57.8 - 46.9 %   Platelets 153 150 - 400 K/uL   nRBC 0.0 0.0 - 0.2 %  Comprehensive metabolic panel     Status: Abnormal   Collection Time: 02/24/23  4:13 AM  Result Value Ref Range   Sodium 136 135 - 145 mmol/L   Potassium 3.9 3.5 - 5.1  mmol/L   Chloride 109 98 - 111 mmol/L   CO2 18 (L) 22 - 32 mmol/L   Glucose, Bld 87 70 - 99 mg/dL   BUN 5 (L) 6 - 20 mg/dL   Creatinine, Ser 6.29 0.44 - 1.00 mg/dL   Calcium 9.4 8.9 - 52.8 mg/dL   Total Protein 6.4 (L) 6.5 - 8.1 g/dL   Albumin 2.8 (L) 3.5 - 5.0 g/dL   AST 18 15 - 41 U/L   ALT 13 0 - 44 U/L   Alkaline Phosphatase 87 38 - 126 U/L   Total Bilirubin 0.5 <1.2 mg/dL   GFR, Estimated >41 >32 mL/min   Anion gap 9 5 - 15   --/--/PENDING (11/20 0410)  Imaging:    MAU Course/MDM: I have reviewed the triage vital signs and the nursing notes.   Pertinent labs & imaging results that were available during my care of the patient were reviewed by me and considered in my medical decision making (see chart for details).      I have reviewed her medical records including past results, notes and treatments.   I have ordered labs and reviewed results. These are normal NST reviewed, initially reactive then less reactive after Fentanyl given Consult Dr Sallye Ober. with presentation, exam findings and test  results. She recommends checking labs and watching BP in relation to Labetalol protocol given.  When BPs became more elevated after dose escalation, decision was made to proceed with C/S.  She is on the way in .  Treatments in MAU included EFM, Preeclampsia focused order set, Fentanyl x 1, preoperative preparation.    Assessment: Single IUP at [redacted]w[redacted]d Preterm uterine contractions Chronic Hypertension with superimposed preeclampsia  Plan: Admit for delivery Preoperative C/S orders in process Dr Sallye Ober to follow and assume care   Wynelle Bourgeois CNM, MSN Certified Nurse-Midwife 02/24/2023 5:33 AM

## 2023-02-24 NOTE — H&P (Addendum)
[name] [name] is a 32 y.o. female [phone] at 36 weeks 5 days EGA with a history of chronic HTN and on oral medication who presented with contractions and found to have severe range blood pressures. She has received IV labetalol protocol and with continued severe range blood pressures.  Her cervix has changed from 1.5 cm to 2 cm dilation. LMP 06/12/22 EDD 03/19/23.   Prenatal care was received at [facility] by Dr. Richardson Dopp. Prenatal course was significant for chronic HTN and she is s/p several MAU visits for blood pressure control.    OB History     Gravida  4   Para  1   Term      Preterm  1   AB  2   Living  1      SAB  2   IAB      Ectopic      Multiple      Live Births  1          Past Medical History:  Diagnosis Date   Anxiety    Asthma    Depression    EDS (Ehlers-Danlos syndrome)    Hyperlipidemia    Migraine    Ovarian cyst    POTS (postural orthostatic tachycardia syndrome)    POTS (postural orthostatic tachycardia syndrome)    Preeclampsia, severe    Transaminitis    Vitamin B12 deficiency    Vitamin D deficiency    Past Surgical History:  Procedure Laterality Date   CESAREAN SECTION     EYE SURGERY     x2   WISDOM TOOTH EXTRACTION     Family History: family history includes Anxiety disorder in her mother; Hypertension in her mother; Osteoarthritis in her paternal grandmother; Other in her father. Social History:  reports that she has never smoked. She has never used smokeless tobacco. She reports that she does not currently use alcohol. She reports that she does not currently use drugs.     Maternal Diabetes: No Genetic Screening: Normal Maternal Ultrasounds/Referrals: Normal Fetal Ultrasounds or other Referrals:  None Maternal Substance Abuse:  No Significant Maternal Medications:  Meds include: Other:  Significant Maternal Lab Results:  Group B Strep negative Number of Prenatal Visits:greater than 3 verified prenatal visits Maternal  Vaccinations: Other Comments:  None  Review of Systems History Dilation: 2 Effacement (%): 90 Station: -3 Exam by:: [employer], RN Blood pressure 138/84, pulse 92, temperature 98.5 F (36.9 C), temperature source Oral, resp. rate 19, height 4\' 9"  (1.448 m), weight 87.3 kg, last menstrual period 06/12/2022, SpO2 97%, currently breastfeeding. Exam Physical Exam  Constitutional: She is oriented to person, place, and time. She appears well-developed and well-nourished.  She is in moderate distress with contractions.  HENT:  Head: Normocephalic and atraumatic.  Neck: Normal range of motion.  Cardiovascular: Normal rate.    Respiratory: Effort normal.   GI: Soft.  Skin: Skin is warm and dry.  Psychiatric: She has a normal mood and affect. Her behavior is normal.   Genitourinary: Gravid uterus, large for gestational age.     EFM: 150 baseline, moderate variability, variable decelerations.  TOCO: Irregular contractions q 2 to 5 mins.   Prenatal labs: ABO, Rh: --/--/O POS (11/20 0410) Antibody: NEG (11/20 0410) Rubella: Immune (04/22 0000) RPR: Nonreactive (09/26 0000)  HBsAg: Negative (04/22 0000)  HIV: Non-reactive (04/22 0000)  GBS:   Negative  Current Outpatient Medications  Medication Instructions   acetaminophen (TYLENOL) 1,000 mg, Oral, Every 6 hours  PRN   aspirin EC 81 mg, Oral, Daily, Swallow whole.   azelastine (ASTELIN) 0.1 % nasal spray 1 spray, Each Nare, Daily at bedtime, Use in each nostril as directed   Botulinum Toxin Type A (BOTOX) 200 units SOLR Injection, Every 3 months   Calcium Citrate-Vitamin D (CALCIUM + D PO) 1 tablet, Oral, Daily   cetirizine (ZYRTEC) 10 mg, Oral, Daily   CoQ10 200 mg, Oral, Daily at bedtime   Cyanocobalamin 1000 MCG/ML KIT 1 mL, Injection, Every 30 days   diphenhydrAMINE (BENADRYL) 50 mg, Oral, Every 6 hours PRN   famotidine (PEPCID) 10 mg, Oral, Daily at bedtime   fluticasone (FLONASE) 50 MCG/ACT nasal spray 1 spray, Each  Nare, Daily at bedtime   folic acid (FOLVITE) 1 mg, Oral, Daily   labetalol (NORMODYNE) 200 mg, Oral, 3 times daily   Magnesium 400 mg, Oral, Daily at bedtime   metoCLOPramide (REGLAN) 10 mg, Oral, Every 6 hours PRN   montelukast (SINGULAIR) 10 mg, Oral, Every evening   NIFEdipine (PROCARDIA XL) 90 mg, Oral, Daily   ondansetron (ZOFRAN-ODT) 8 mg, Oral, Every 8 hours PRN   Prenatal Multivit-Min-Fe-FA (PRE-NATAL PO) 1 tablet, Oral, Daily   rizatriptan (MAXALT) 10 mg, Oral, As needed, May repeat in 2 hours if needed   sertraline (ZOLOFT) 50 mg, Oral, Daily   SUMAtriptan (IMITREX) 50-100 mg, Oral, Every 2 hours PRN, May repeat in 2 hours if headache persists or recurs.   zolmitriptan (ZOMIG) 5 MG nasal solution 1 spray, Nasal, As needed    Allergies  Allergen Reactions   Fenofibrate Nausea And Vomiting   Tape Dermatitis    Tearing of skin   Wound Dressing Adhesive Dermatitis    Tearing of skin   Amphetamine-Dextroamphetamine Palpitations   Ritalin [Methylphenidate] Palpitations    Assessment/Plan: 32 y/o [id number] at 36 weeks 5 days EGA with chronic HTN and superimposed preeclampsia with severe features, - Will proceed with repeat cesarean section.  - Patient also desires bilateral complete removal of both falopian tubes for permanent sterilization.  - Magnesium sulfate IV was started for seizure prophylaxis.   Prescilla Sours, MD.  02/24/2023, 5:55 AM

## 2023-02-24 NOTE — Anesthesia Procedure Notes (Signed)
Spinal  Patient location during procedure: OR Start time: 02/24/2023 6:20 AM End time: 02/24/2023 6:23 AM Reason for block: surgical anesthesia Staffing Performed: anesthesiologist  Anesthesiologist: Lannie Fields, DO Performed by: Lannie Fields, DO Authorized by: Lannie Fields, DO   Preanesthetic Checklist Completed: patient identified, IV checked, risks and benefits discussed, surgical consent, monitors and equipment checked, pre-op evaluation and timeout performed Spinal Block Patient position: sitting Prep: DuraPrep and site prepped and draped Patient monitoring: cardiac monitor, continuous pulse ox and blood pressure Approach: midline Location: L3-4 Injection technique: single-shot Needle Needle type: Pencan  Needle gauge: 24 G Needle length: 9 cm Assessment Sensory level: T6 Events: CSF return Additional Notes Functioning IV was confirmed and monitors were applied. Sterile prep and drape, including hand hygiene and sterile gloves were used. The patient was positioned and the spine was prepped. The skin was anesthetized with lidocaine.  Free flow of clear CSF was obtained prior to injecting local anesthetic into the CSF.  The spinal needle aspirated freely following injection.  The needle was carefully withdrawn.  The patient tolerated the procedure well.

## 2023-02-24 NOTE — MAU Note (Addendum)
[name] [name] is a 32 y.o. at [redacted]w[redacted]d here in MAU reporting: ctx every 3-5 minutes that began at 0100 tonight. Pt denies any LOF or VB. +FM. Pt is scheduled for a repeat C/S.   Onset of complaint: 0100 Pain score: 8/10 Vitals:   02/24/23 0325  BP: (!) 138/99  Pulse: 87  Resp: 20  Temp: 98.7 F (37.1 C)  SpO2: 99%     FHT:151 Lab orders placed from triage:

## 2023-02-24 NOTE — MAU Note (Signed)
Pt attempted to void unable to at this time

## 2023-02-24 NOTE — Lactation Note (Signed)
This note was copied from a baby's chart. Lactation Consultation Note  Patient Name: [name] [name] [id number] Date: 02/24/2023 Age:33 hours   [facility] secretary called this LC for assistance because [name] needed assistance with getting her flange size; noticed she's been using # 24 flanges, re-sized her to # 21 due to elastic nipples. Provided a pumping band in size "L" for hand on pumping, she's been pumping since 9 am this morning due to [name] being taken to the nursery; she's been getting 1-2 ml of EBM, praised her for her efforts. Her goal is to exclusively breastfeed, she breastfed her daughter for 2 1/2 years. Dr. Tobin Chad came for  NICU consult and let parents knows that baby will soon leave the nursery, he's been there the majority of the day due to hypothermia, no need for NICU transfer at this point. FOB present and supportive. All questions and concerns answered, family to contact [facility] services PRN.  Maryem Shuffler S Skylor Schnapp 02/24/2023, 5:27 PM

## 2023-02-24 NOTE — Anesthesia Preprocedure Evaluation (Signed)
Anesthesia Evaluation  Patient identified by MRN, date of birth, ID band Patient awake    Reviewed: Allergy & Precautions, NPO status , Patient's Chart, lab work & pertinent test results, reviewed documented beta blocker date and time   Airway Mallampati: II  TM Distance: >3 FB Neck ROM: Full    Dental  (+) Teeth Intact, Dental Advisory Given   Pulmonary asthma    Pulmonary exam normal breath sounds clear to auscultation       Cardiovascular hypertension (cHTN with superimposed preE, BP 157/94 preop), Pt. on medications and Pt. on home beta blockers Normal cardiovascular exam Rhythm:Regular Rate:Normal     Neuro/Psych  Headaches PSYCHIATRIC DISORDERS Anxiety Depression       GI/Hepatic negative GI ROS, Neg liver ROS,,,  Endo/Other    Class 3 obesityBMI 42  Renal/GU negative Renal ROS  negative genitourinary   Musculoskeletal negative musculoskeletal ROS (+)    Abdominal  (+) + obese  Peds  Hematology negative hematology ROS (+)   Anesthesia Other Findings   Reproductive/Obstetrics (+) Pregnancy 36 weeks, urgent section for worsening preE Prior section 2021 under epidural                             Anesthesia Physical Anesthesia Plan  ASA: 3  Anesthesia Plan: Spinal   Post-op Pain Management: Regional block, Toradol IV (intra-op)* and Ofirmev IV (intra-op)*   Induction:   PONV Risk Score and Plan: 3 and Ondansetron, Dexamethasone and Treatment may vary due to age or medical condition  Airway Management Planned: Natural Airway and Nasal Cannula  Additional Equipment: None  Intra-op Plan:   Post-operative Plan:   Informed Consent: I have reviewed the patients History and Physical, chart, labs and discussed the procedure including the risks, benefits and alternatives for the proposed anesthesia with the patient or authorized representative who has indicated his/her understanding  and acceptance.       Plan Discussed with: CRNA  Anesthesia Plan Comments:         Anesthesia Quick Evaluation

## 2023-02-24 NOTE — Op Note (Addendum)
Patient: [name] [name] DOB: December 30, 1990 MRN:  [id number]  DATE OF SURGERY:  02/24/2023  PREOP DIAGNOSIS:  1. 36 week 5 day EGA IUP. 2. Chronic hypertension (HTN) with superimposed Preeclampsia with severe features.  3.  History of 1 prior cesarean section and desiring a repeat cesarean delivery, she declines trial of labor and vaginal birth after a cesarean section. 4. Early labor at 2 cm cervical dilation and with contractions.  5. Abnormal fetal heart tracing with recurrent variable decelerations. 6. Multiparous patient desiring permanent sterilization.   POSTOP DIAGNOSIS: Same as above.    PROCEDURE:  1. Urgent Repeat low uterine segment transverse cesarean section via Pfannenstiel incision.  2. Postpartum bilateral tubal ligation via bilateral complete salpingectomy.   SURGEON: Dr.  Hoover Browns.  ASSISTANT: Ob fellow, Dr. Emeline General.   SURGEON ATTESTATION: I was present and scrubbed for the entire case.  An experienced assistant was required given the standard of surgical care given the complexity of the case.  This assistant was needed for exposure, dissection, suctioning, retraction, instrument exchange,  assisting with delivery with administration of fundal pressure, and for overall help during the surgery.    ANESTHESIA: Spinal, Dr. Lacy Duverney.   COMPLICATIONS: None.  FINDINGS: Viable female infant in cephalic presentation, DOP, weight 7 lbs 15.3 oz, Apgar scores of  7 and 8. Normal uterus, normal bilateral fallopian tubes, normal bilateral ovaries.      EBL:    600 cc.  IV FLUID:  1400 cc LR.   URINE OUTPUT: 200 cc clear urine.  INDICATIONS:  32 y/o [id number] who presented in early labor and was found to have severe range blood pressures not responsive to IV antihypertensives.  She was for delivery by repeat cesarean section.  She also desired permanent sterilization via complete removal of both fallopian tubes and had already signed the tubal consent form at the  office.  She was consented for the procedures after explaining risks benefits and alternatives of the procedures including but not limited to risks of heavy bleeding, infection and damage to organs, tubal regret.  All her questions were answered pre-operatively.   PROCEDURE:  She was taken to the operating room where her spinal anesthesia was found to be adequate. She was prepped with chloraprep and draped in the usual sterile fashion and a Foley catheter was placed. She received IV ancef  and Tranexamic acid preoperatively and magnesium sulfate for seizure prophylaxis had already been started. A Pfannenstiel incision was made with the scalpel over the prior incision and the incision extended through the subcutaneous layer and also the fascia with the bovie. Small perforators in the subcutaneous layer were contained with the Bovie. The fascia was nicked in the midline and then was further separated from the rectus muscles bilaterally using Mayo scissors. Kochers were placed inferiorly and then superiorly to allow further separation of fascia from the rectus muscles.  The peritoneal cavity was entered sharply with scissors.  The Alexis retractor was placed in. The vesicouterine fold dissection was created using Metzenbaum scissors.  Moderate adhesions were noted on the uterovesical fold.    The uterus was incised with a scalpel and the incision extended bluntly bilaterally and with bandage scissors.  Membranes were ruptured and moderate clear amniotic fluid was noted.  The head, then the rest of the body was delivered with abdominal pressure.  She delivered a viable female infant, apgar scores 7, 8.  The edges of the uterus were grasped with T clamps.  The cord was clamped  and cut after 1 minute. Cord blood was collected.   The uterus was not exteriorized.  The placenta was delivered with gentle traction on the umbilical cord.  The uterus was cleared of clots and debris with a lap.  The uterine incision was  closed with #1 Vicryl in a running locked stitch. An imbricating layer of the same stitch was placed over the initial closure.  A small area that bled on the right side was contained with figure of 8 stitch.  The left  fallopian tube was then identified, brought to the incision and followed up to the fimbria end.    The hand held small Ligasure impact was used to completely transect the fallopian tube from the mesosalpinx.  Excellent hemostasis was noted on the remaining pedicles.  A similar procedure was done to remove the right fallopian tube.  Both uterine cornua and remaining mesosalpinx were hemostatic.  Irrigation was applied and suctioned out.  The uterine incision was noted to be hemostatic.  The muscles and peritoneum were then reapproximated using 2-0 chromic suture.  Fascia was closed using 0 Vicryl in a running stitch. The subcutaneous layer was irrigated and suctioned out. Small perforators were contained with the bovie.  The subcutaneous layer was closed using 2-0 plain in interrupted stitches. The skin was closed using Mellody Dance Needle with 4-0 vicryl.  Dermabond was then applied over the incision and allowed to dry.  The patient was then cleaned and she was taken to the recovery room with her baby in stable conditions.   SPECIMEN:  Placenta to pathology.   Umbilical cord blood to lab.  Left and right fallopian tubes to lab.   DISPOSITION: TO PACU, STABLE.   Dr. Hoover Browns.  Date:02/24/2023.

## 2023-02-25 ENCOUNTER — Ambulatory Visit: Payer: Medicaid Other | Admitting: Physical Therapy

## 2023-02-25 LAB — CBC
HCT: 31.5 % — ABNORMAL LOW (ref 36.0–46.0)
Hemoglobin: 10.7 g/dL — ABNORMAL LOW (ref 12.0–15.0)
MCH: 31.4 pg (ref 26.0–34.0)
MCHC: 34 g/dL (ref 30.0–36.0)
MCV: 92.4 fL (ref 80.0–100.0)
Platelets: 175 10*3/uL (ref 150–400)
RBC: 3.41 MIL/uL — ABNORMAL LOW (ref 3.87–5.11)
RDW: 13.9 % (ref 11.5–15.5)
WBC: 13.7 10*3/uL — ABNORMAL HIGH (ref 4.0–10.5)
nRBC: 0 % (ref 0.0–0.2)

## 2023-02-25 MED ORDER — LABETALOL HCL 200 MG PO TABS
200.0000 mg | ORAL_TABLET | Freq: Three times a day (TID) | ORAL | Status: DC
  Administered 2023-02-25 – 2023-02-26 (×5): 200 mg via ORAL
  Filled 2023-02-25 (×5): qty 1

## 2023-02-25 MED ORDER — NIFEDIPINE ER OSMOTIC RELEASE 30 MG PO TB24
30.0000 mg | ORAL_TABLET | Freq: Every day | ORAL | Status: DC
  Filled 2023-02-25: qty 1

## 2023-02-25 MED ORDER — NIFEDIPINE ER OSMOTIC RELEASE 60 MG PO TB24
60.0000 mg | ORAL_TABLET | Freq: Every day | ORAL | Status: DC
  Administered 2023-02-26: 60 mg via ORAL
  Filled 2023-02-25: qty 1

## 2023-02-25 NOTE — Lactation Note (Signed)
This note was copied from a baby's chart. Lactation Consultation Note  Patient Name: [name] [name] ACZYS'A Date: 02/25/2023 Age:33 hours Reason for consult: Follow-up assessment;Late-preterm 34-36.6wks;RN request  Visited with family of 14 hours old LPI NICU female; [name] [name] is a P2 and reported she's been pumping consistently and also taking baby "[name] [name]" to breast. She voiced no pain/discomfort with feedings at the breast or pumping and feel comfortable practicing independent breastfeeding. Asked her to call for assistance when needed. Parents have been supplementing with donor milk until mother's milk comes in; re-educated about the cycle of lactogenesis II and how each pumping session might have different output. Discussed options for rental and loaner pumps until her insurance pumps comes in the mail.  Maternal Data Does the patient have breastfeeding experience prior to this delivery?: Yes  Feeding Mother's Current Feeding Choice: Breast Milk and Donor Milk  Lactation Tools Discussed/Used Tools: Pump;Flanges;Coconut oil Flange Size: 21 Breast pump type: Double-Electric Breast Pump Reason for Pumping: Infant initially to NICU, now rooming-in with mom Pumping frequency: Every three hours, after baby breasts, or if baby suplements with donor milk Pumped volume: 2 mL  Interventions Interventions: Expressed milk;Coconut oil;DEBP;Education  Plan Encouraged to put baby to breast +8 times/24 hours or sooner if feeding cues are present She'll continue pumping every 3 hours after feedings/attempts at the breast Parents will continue supplementing with EBM/donor milk per LPI guidelines  FOB present and supportive. All questions and concerns answered, family to contact [facility] services PRN.  Discharge Pump: DEBP;Personal (Per mom, pump has been shipped, expected in next 7-10 days. Discussed options- mom most interested in loaner pump until her pump arrives)  Consult Status Consult  Status: Follow-up Date: 02/26/23 Follow-up type: In-patient   Jeffre Enriques Venetia Constable 02/25/2023, 4:19 PM

## 2023-02-25 NOTE — Social Work (Signed)
Patient screened out for psychosocial assessment since none of the following apply: Psychosocial stressors documented in mother or baby's chart Gestation less than 32 weeks Code at delivery  Infant with anomalies  Please contact the Clinical Social Worker if specific needs arise, by MOB's request, or if MOB scores greater than 9/yes to question 10 on Edinburgh Postpartum Depression Screen.  Vivi Barrack, MSW, LCSW Clinical Social Worker  [phone] 2022-11-20  9:49 AM

## 2023-02-25 NOTE — Progress Notes (Signed)
Subjective: Postpartum Day 1: Cesarean Delivery Patient reports incisional pain, tolerating PO, and no problems voiding.    Objective: Vital signs in last 24 hours: Temp:  [98 F (36.7 C)-99.1 F (37.3 C)] 99.1 F (37.3 C) (11/21 1953) Pulse Rate:  [85-100] 90 (11/21 2100) Resp:  [14-18] 17 (11/21 1953) BP: (133-156)/(87-100) 154/88 (11/21 1953) SpO2:  [97 %-100 %] 98 % (11/21 1953)  Physical Exam:  General: alert, cooperative, and no distress Lochia: appropriate Uterine Fundus: firm Incision: healing well DVT Evaluation: No evidence of DVT seen on physical exam.  Recent Labs    02/24/23 0413 02/25/23 0733  HGB 12.4 10.7*  HCT 36.1 31.5*    Assessment/Plan: Status post Cesarean section. Doing well postoperatively.  Chronic hypertension with superimposed severe preeclampsia. Currently on procardia xl 60 mg daily and labetalol 200 mg tid.  POTS- labetalol 200 mg tid.  Possible d/c home pod #2 or 3 depending on blood pressure control.  Encouraged ambulation.   Gerald Leitz, MD 02/25/2023, 9:22 PM

## 2023-02-26 ENCOUNTER — Encounter (HOSPITAL_COMMUNITY)
Admission: RE | Admit: 2023-02-26 | Discharge: 2023-02-26 | Disposition: A | Discharge status: Home / Self Care | Payer: Medicaid Other | Source: Ambulatory Visit | Attending: Family Medicine | Admitting: Family Medicine

## 2023-02-26 ENCOUNTER — Ambulatory Visit: Payer: Medicaid Other | Admitting: Internal Medicine

## 2023-02-26 LAB — SURGICAL PATHOLOGY

## 2023-02-26 MED ORDER — NIFEDIPINE ER OSMOTIC RELEASE 30 MG PO TB24
30.0000 mg | ORAL_TABLET | Freq: Every day | ORAL | Status: DC
  Administered 2023-02-26: 30 mg via ORAL
  Filled 2023-02-26: qty 1

## 2023-02-26 NOTE — Progress Notes (Signed)
Subjective: Postpartum Day 2: Cesarean Delivery Patient reports pain is well controllered, tolerating PO and no problems voiding.    Objective: Vital signs in last 24 hours: Temp:  [98.2 F (36.8 C)-99.1 F (37.3 C)] 98.3 F (36.8 C) (11/22 0737) Pulse Rate:  [77-100] 77 (11/22 0737) Resp:  [16-19] 18 (11/22 0737) BP: (133-158)/(86-95) 152/87 (11/22 0737) SpO2:  [97 %-99 %] 98 % (11/22 0520)  Physical Exam:  General: alert, cooperative, and no distress Lochia: appropriate Uterine Fundus: firm Incision: healing well DVT Evaluation: No evidence of DVT seen on physical exam.  Recent Labs    02/24/23 0413 02/25/23 0733  HGB 12.4 10.7*  HCT 36.1 31.5*    Assessment/Plan: Status post Cesarean section. Doing well postoperatively.  Chronic hypertension with superimposed severe preeclampsia. Currently on procardia xl 60 mg daily and labetalol 200 mg tid.  POTS- labetalol 200 mg tid.  Possible d/c home pod #2 or 3 depending on blood pressure control. Discussed adding back procardia 30 mg at bedtime.  If BP remains elevated this afternoon, add 30 mg procardia at bedtime and d/c 11/23 if BP <150/100. Encouraged ambulation.   [name] Scherer Autry-Lott, DO 02/26/2023, 1:59 PM

## 2023-02-27 MED ORDER — LABETALOL HCL 200 MG PO TABS
600.0000 mg | ORAL_TABLET | Freq: Three times a day (TID) | ORAL | Status: DC
  Administered 2023-02-27: 600 mg via ORAL
  Filled 2023-02-27 (×3): qty 3

## 2023-02-27 MED ORDER — AMLODIPINE BESYLATE 5 MG PO TABS
5.0000 mg | ORAL_TABLET | Freq: Every day | ORAL | Status: DC
  Administered 2023-02-27: 5 mg via ORAL
  Filled 2023-02-27: qty 1

## 2023-02-27 MED ORDER — LABETALOL HCL 200 MG PO TABS: 200.0000 mg | ORAL_TABLET | Freq: Once | ORAL | Status: DC

## 2023-02-27 MED ORDER — PROPRANOLOL HCL 40 MG PO TABS: 40.0000 mg | ORAL_TABLET | Freq: Three times a day (TID) | ORAL | Status: DC

## 2023-02-27 MED ORDER — LABETALOL HCL 200 MG PO TABS
800.0000 mg | ORAL_TABLET | Freq: Three times a day (TID) | ORAL | Status: DC
  Administered 2023-02-27 – 2023-02-28 (×4): 800 mg via ORAL
  Filled 2023-02-27 (×3): qty 4

## 2023-02-27 NOTE — Progress Notes (Signed)
Spoke with pharmacist and patient and patient will chat to her cardiologist Dr. Laurell Roof and [facility] regarding BP medication.  Will discontinue the procardia as it is causing HAs and pt says calcium channel blockers exacerbate her POTS.  Will increase labetalol and if needs an additional medication, will take recommendation from pt's cardiologist.  Currently pt is doing well otherwise with HA resolving.

## 2023-02-27 NOTE — Anesthesia Postprocedure Evaluation (Signed)
Anesthesia Post Note  Patient: [name] [name]  Procedure(s) Performed: REPEAT CESAREAN SECTION WITH BILATERAL TUBAL LIGATION BY SALPINGECTOMY (Bilateral: Abdomen)     Patient location during evaluation: PACU Anesthesia Type: Spinal Level of consciousness: awake Pain management: pain level controlled Vital Signs Assessment: post-procedure vital signs reviewed and stable Respiratory status: spontaneous breathing Cardiovascular status: stable Postop Assessment: no headache, no backache, spinal receding, patient able to bend at knees and no apparent nausea or vomiting Anesthetic complications: no  No notable events documented.  Last Vitals:  Vitals:   02/27/23 0456 02/27/23 0512  BP: (!) 178/87 (!) 156/94  Pulse: 74 77  Resp: 18   Temp: 36.8 C   SpO2:      Last Pain:  Vitals:   02/27/23 0512  TempSrc:   PainSc: 0-No pain   Pain Goal: Patients Stated Pain Goal: 4 (02/24/23 0830)                 Caren Macadam

## 2023-02-27 NOTE — Progress Notes (Signed)
Subjective: POD# 3 Information for the patient's newborn:  [name] [name], [name] [name] [[id number]]  female  Baby's Name [name] Circumcision No  Reports feeling good, mild HA is present, denies visual changes (wearing corrective lenses), denies RUQ pain. Lengthy conversation RE: POTS and hypertension management. Per pt, the combination of labetalol and propranolol was used in the immediate postpartum period after last birth to manage hypertension and symptoms vagal symptoms of POTS. Headaches and orthostatic hypotenstion were exacerbated with procardia Feeding: breast Reports tolerating PO and denies N/V, foley removed, ambulating and urinating w/o difficulty  Pain controlled with  PO meds Denies HA/SOB/dizziness  Flatus passing Vaginal bleeding is normal, no clots     Objective:  VS:  Vitals:   02/26/23 2324 02/26/23 2345 02/27/23 0456 02/27/23 0512  BP: (!) 166/101 (!) 156/99 (!) 178/87 (!) 156/94  Pulse: 79 79 74 77  Resp: 16  18   Temp: 97.9 F (36.6 C)  98.3 F (36.8 C)   TempSrc: Oral  Oral   SpO2: 97% 99%    Weight:      Height:        Intake/Output Summary (Last 24 hours) at 02/27/2023 0715 Last data filed at 02/26/2023 1600 Gross per 24 hour  Intake 1480 ml  Output 1600 ml  Net -120 ml     Recent Labs    02/25/23 0733  WBC 13.7*  HGB 10.7*  HCT 31.5*  PLT 175    Blood type: --/--/O POS (11/20 0410) Rubella: Immune (04/22 0000)    Physical Exam:  General: alert, cooperative, and no distress CV: Regular rate and rhythm Resp: clear Abdomen: soft, nontender, normal bowel sounds Incision:  dressing is C/D/I Uterine Fundus: firm, below umbilicus, nontender Lochia: minimal Ext: trace edema, negative for tenderness, pain, and cords   Assessment/Plan: 32 y.o.   POD# 3. [id number]                  Principal Problem:   Chronic hypertension with superimposed preeclampsia    --DC Procardia    -Start propranolol 40 mg PO BID  Routine post-op PP care          Advance  diet as tolerated Advised warm fluids and ambulation to improve GI motility Lactation support PRN Anticipate D/C on POD 4 Plan developed w/ Dr. Evorn Gong, DNP, CNM 02/27/2023, 7:15 AM

## 2023-02-27 NOTE — Lactation Note (Signed)
This note was copied from a baby's chart. Lactation Consultation Note  Patient Name: [name] [name] Date: 02/27/2023 Age:33 hours Reason for consult: Follow-up assessment;Infant weight loss;Late-preterm 34-36.6wks Mom pumping with the DEBP as LC entered the room with EBM yield of 90 ml. LC praised mom for her consistent pumping and BF her baby.  Per mom the baby is breast feeding well.  LC reviewed BF D/C teaching - ( see below ) and for DEBP for home.  LC offered mom and LC O/P due to the baby being LPT and mom preferred to call.   Maternal Data Has patient been taught Hand Expression?:  (per mom feels comfortable with hand expressing, exp) Does the patient have breastfeeding experience prior to this delivery?: Yes  Feeding Mother's Current Feeding Choice: Breast Milk  LATCH Score - LC unable to assess a latch this am , baby sound asleep and recently fed,.     Lactation Tools Discussed/Used Breast pump type: Double-Electric Breast Pump Pump Education: Setup, frequency, and cleaning;Milk Storage Pumped volume: 90 mL (mom was pumping as LC entered the room)  Interventions Interventions: Breast feeding basics reviewed;Coconut oil;Hand pump;DEBP;Education;LC Services brochure  Discharge Discharge Education: Engorgement and breast care;Warning signs for feeding baby;Outpatient recommendation;Other (comment) (mom mentioned she will call if needed) Pump: DEBP;Personal;Manual (per mom has a Spectra DEBP at home she used with her 1st baby, If not strong enough will come back to rent one from the [employer] until hers comes in 7-10 days) [facility] Program: No (per mom not active yet , has made appt for January .)  Consult Status Consult Status: Complete Date: 02/27/23    Kathrin Greathouse 02/27/2023, 8:33 AM

## 2023-02-28 DIAGNOSIS — Z98891 History of uterine scar from previous surgery: Secondary | ICD-10-CM

## 2023-02-28 DIAGNOSIS — Z8659 Personal history of other mental and behavioral disorders: Secondary | ICD-10-CM

## 2023-02-28 DIAGNOSIS — D62 Acute posthemorrhagic anemia: Secondary | ICD-10-CM | POA: Diagnosis not present

## 2023-02-28 DIAGNOSIS — D509 Iron deficiency anemia, unspecified: Secondary | ICD-10-CM

## 2023-02-28 MED ORDER — MAGNESIUM OXIDE -MG SUPPLEMENT 400 (240 MG) MG PO TABS
400.0000 mg | ORAL_TABLET | Freq: Every day | ORAL | Status: DC
  Administered 2023-02-28: 400 mg via ORAL
  Filled 2023-02-28: qty 1

## 2023-02-28 MED ORDER — AMLODIPINE BESYLATE 5 MG PO TABS: 5.0000 mg | ORAL_TABLET | Freq: Every day | ORAL | 0 refills | Status: AC

## 2023-02-28 MED ORDER — AMLODIPINE BESYLATE 5 MG PO TABS
5.0000 mg | ORAL_TABLET | Freq: Every day | ORAL | Status: DC
  Administered 2023-02-28: 5 mg via ORAL
  Filled 2023-02-28: qty 1

## 2023-02-28 MED ORDER — OXYCODONE HCL 5 MG PO TABS: 5.0000 mg | ORAL_TABLET | Freq: Four times a day (QID) | ORAL | 0 refills | Status: AC | PRN

## 2023-02-28 MED ORDER — LABETALOL HCL 200 MG PO TABS: 800.0000 mg | ORAL_TABLET | Freq: Three times a day (TID) | ORAL | 0 refills | Status: AC

## 2023-02-28 NOTE — Discharge Summary (Signed)
RCS OB Discharge Summary       Patient Name: [name] [name] DOB: [birth year] MRN: [id number]  Date of admission: 02/24/2023 Delivering MD: Hoover Browns Date of delivery: 02/24/2023 Type of delivery: RCS with BTL  Newborn Data: Sex: Baby female Circumcision: no circ Live born female  Birth Weight: 7 lb 15.3 oz (3610 g) APGAR: 7, 8  Newborn Delivery   Birth date/time: 02/24/2023 06:49:00 Delivery type: C-Section, Low Transverse Trial of labor: No C-section categorization: Repeat     Feeding: breast Infant being discharge to home with mother in stable condition.   Admitting diagnosis: Chronic hypertension with superimposed preeclampsia [O11.9] Intrauterine pregnancy: [redacted]w[redacted]d     Secondary diagnosis:  Principal Problem:   Chronic hypertension with superimposed preeclampsia Active Problems:   S/P cesarean section   Acute blood loss anemia   H/O anxiety disorder   H/O: depression                                Complications: None                                                              Intrapartum Procedures: cesarean: low cervical, transverse and tubal ligation Postpartum Procedures: none Complications-Operative and Postpartum: none Augmentation: AROM and at time of delivery    History of Present Illness: [name] [name] is a 32 y.o. female, [phone], who presents at [redacted]w[redacted]d weeks gestation. The patient has been followed at [facility]  Her pregnancy has been complicated by:  Patient Active Problem List   Diagnosis Date Noted   S/P cesarean section 02/28/2023   Acute blood loss anemia 02/28/2023   H/O anxiety disorder 02/28/2023   H/O: depression 02/28/2023   Chronic hypertension with superimposed preeclampsia 02/24/2023   POTS (postural orthostatic tachycardia syndrome) 01/28/2023   Obesity affecting pregnancy, antepartum 01/28/2023   Pregnancy with history of cesarean section, antepartum 01/28/2023   History of pre-eclampsia in prior  pregnancy, currently pregnant 01/28/2023   Primary hypertension 06/07/2020   NASH (nonalcoholic steatohepatitis) 06/26/2019   Combined hyperlipidemia 06/26/2019     Active Ambulatory Problems    Diagnosis Date Noted   NASH (nonalcoholic steatohepatitis) 06/26/2019   Combined hyperlipidemia 06/26/2019   Primary hypertension 06/07/2020   POTS (postural orthostatic tachycardia syndrome) 01/28/2023   Obesity affecting pregnancy, antepartum 01/28/2023   Pregnancy with history of cesarean section, antepartum 01/28/2023   History of pre-eclampsia in prior pregnancy, currently pregnant 01/28/2023   Resolved Ambulatory Problems    Diagnosis Date Noted   No Resolved Ambulatory Problems   Past Medical History:  Diagnosis Date   Anxiety    Asthma    Depression    EDS (Ehlers-Danlos syndrome)    Hyperlipidemia    Migraine    Ovarian cyst    Preeclampsia, severe    Transaminitis    Vitamin B12 deficiency    Vitamin D deficiency      Hospital course:  Sceduled C/S   32 y.o. yo [id number] at [redacted]w[redacted]d was admitted to the hospital 02/24/2023 for scheduled cesarean section with the following indication:Elective Repeat and with onset of labor  .Pt was admitted on 11/20 at 36.5 weeks with [facility] with SI preE with SF, PCR was 0.26,  came in early labor proceeded with RCS for NRFHT on 11/20 and was taken back to the OR with DR Sallye Ober, Qbl was , hgb drop of 12.4-10.7, asymptomatic and on PO iron, not significant for her stay, H/O POTS was on 20mg  propranolol TID and labetalol 200mg  TID for CHTn during pregnancy. Delivery details are as follows:  Membrane Rupture Time/Date: 6:48 AM,02/24/2023  Delivery Method:C-Section, Low Transverse Operative Delivery:N/A Details of operation can be found in separate operative note.  Patient had a postpartum course complicated bys/p 24 hours PP mag for preE, continue to have elevated BP on labetalol 200mg  TID, added procardia but HA noted and made pt feel bad, therefore  it was d/c, got to a total of 60mg  in the morning and 30at night of procardia with continued elevated bp, attempted amlodipine 5mg  on 11/21, but stopped per pt requested r/t being a CCB and feels it will makes PTS worse, then increased labetalol to 600TID then 800 TID on 11/23 for sustained elevated Bps, 11/24 continue to be elevated added amlodipine back in 5mg  and now normotensive, pt to go home on 800mg  labetalol TID and 5mg  amlodipine for Bp with one week f/u, will f/u with cards on POTS syndrome and medication management. Continues zoloft 50mg  for anxiety and depression ,but mood remained happy and stable through out her sty,  Also has h/o asthma but only viral induced, and risk given for bronchospasm with betablocker and pt is aware and endorses desires to stay on beta blocker knowing her risk.  .  She is ambulating, tolerating a regular diet, passing flatus, and urinating well. Patient is discharged home in stable condition on  02/28/23        Newborn Data: Birth date:02/24/2023 Birth time:6:49 AM Gender:Female Living status:Living Apgars:7 ,8  Weight:3610 g   Postpartum POD Day # 4 : Infant was in good condition and remained at the patient's bedside.  The patient was taken to recovery in good condition.  Patient planned to breast feed.  On post-op day 1, patient was doing well, tolerating a regular diet, with Hgb of 10.7.  Throughout her stay, her physical exam was WNL, her incision was CDI, and her vital signs remained stable.  By post-op day 1, she was up ad lib, tolerating a regular diet, with good pain control with po med.  She was deemed to have received the full benefit of her hospital stay, and was discharged home in stable condition.  Contraceptive choice was BTL during surgery.   Patient up ad lib, denies syncope or dizziness. Reports consuming regular diet without issues and denies N/V. Patient reports 0 bowel movement + passing flatus.  Denies issues with urination and reports bleeding is  "lighter."  Patient is breastfeeding and reports going well. Pain is being appropriately managed with use of po meds.    Physical exam  Vitals:   02/28/23 0420 02/28/23 0727 02/28/23 1135 02/28/23 1353  BP: (!) 142/94 (!) 151/94 123/89 128/86  Pulse: 89 91 90 91  Resp: 18 19    Temp: 98.4 F (36.9 C) 98.4 F (36.9 C)    TempSrc: Oral     SpO2: 99% 100%  99%  Weight:      Height:       General: alert, cooperative, and no distress Lochia: appropriate Uterine Fundus: firm Incision: Healing well with no significant drainage, No significant erythema, Dressing is clean, dry, and intact, honeycomb dressing CDI Perineum: intact DVT Evaluation: No evidence of DVT seen on physical exam.  Negative Homan's sign. No cords or calf tenderness. Generalized non pitting edema noted.   Labs: Lab Results  Component Value Date   WBC 13.7 (H) 02/25/2023   HGB 10.7 (L) 02/25/2023   HCT 31.5 (L) 02/25/2023   MCV 92.4 02/25/2023   PLT 175 02/25/2023      Latest Ref Rng & Units 02/24/2023    4:13 AM  CMP  Glucose 70 - 99 mg/dL 87   BUN 6 - 20 mg/dL 5   Creatinine 7.82 - 9.56 mg/dL 2.13   Sodium 086 - 578 mmol/L 136   Potassium 3.5 - 5.1 mmol/L 3.9   Chloride 98 - 111 mmol/L 109   CO2 22 - 32 mmol/L 18   Calcium 8.9 - 10.3 mg/dL 9.4   Total Protein 6.5 - 8.1 g/dL 6.4   Total Bilirubin <4.6 mg/dL 0.5   Alkaline Phos 38 - 126 U/L 87   AST 15 - 41 U/L 18   ALT 0 - 44 U/L 13     Date of discharge: 02/28/2023 Discharge Diagnoses: Premature labor and Preterm delivery  Discharge instruction: per After Visit Summary and "Baby and Me Booklet".  After visit meds:   Activity:           unrestricted and pelvic rest Advance as tolerated. Pelvic rest for 6 weeks.  Diet:                routine Medications: PNV, Ibuprofen, Colace, Iron, and oxy ir, labetalol, amlodapine Postpartum contraception: Tubal Ligation Condition:  Pt discharge to home with baby in stable conditon  Meds: Allergies as of  02/28/2023       Reactions   Fenofibrate Nausea And Vomiting   Tape Dermatitis   Tearing of skin   Wound Dressing Adhesive Dermatitis   Tearing of skin   Amphetamine-dextroamphetamine Palpitations   Ritalin [methylphenidate] Palpitations        Medication List     STOP taking these medications    aspirin EC 81 MG tablet   metoCLOPramide 10 MG tablet Commonly known as: Reglan   NIFEdipine 30 MG 24 hr tablet Commonly known as: Procardia XL       TAKE these medications    acetaminophen 500 MG tablet Commonly known as: TYLENOL Take 1,000 mg by mouth every 6 (six) hours as needed for headache.   amLODipine 5 MG tablet Commonly known as: NORVASC Take 1 tablet (5 mg total) by mouth daily. Start taking on: March 01, 2023   azelastine 0.1 % nasal spray Commonly known as: ASTELIN Place 1 spray into both nostrils at bedtime. Use in each nostril as directed   Botox 200 units injection Generic drug: botulinum toxin Type A Inject as directed every 3 (three) months.   CALCIUM + D PO Take 1 tablet by mouth daily.   cetirizine 10 MG tablet Commonly known as: ZYRTEC Take 10 mg by mouth daily.   CoQ10 200 MG Caps Take 200 mg by mouth at bedtime.   Cyanocobalamin 1000 MCG/ML Kit Inject 1 mL as directed every 30 (thirty) days.   diphenhydrAMINE 25 MG tablet Commonly known as: BENADRYL Take 50 mg by mouth every 6 (six) hours as needed (migraines).   famotidine 10 MG tablet Commonly known as: PEPCID Take 10 mg by mouth at bedtime.   fluticasone 50 MCG/ACT nasal spray Commonly known as: FLONASE Place 1 spray into both nostrils at bedtime.   folic acid 1 MG tablet Commonly known as: FOLVITE Take 1 mg by mouth  daily.   labetalol 200 MG tablet Commonly known as: NORMODYNE Take 200 mg by mouth 3 (three) times daily. What changed: Another medication with the same name was added. Make sure you understand how and when to take each.   labetalol 200 MG  tablet Commonly known as: NORMODYNE Take 4 tablets (800 mg total) by mouth every 8 (eight) hours. What changed: You were already taking a medication with the same name, and this prescription was added. Make sure you understand how and when to take each.   Magnesium 400 MG Tabs Take 400 mg by mouth at bedtime.   montelukast 10 MG tablet Commonly known as: SINGULAIR Take 10 mg by mouth every evening.   ondansetron 8 MG disintegrating tablet Commonly known as: ZOFRAN-ODT Take 8 mg by mouth every 8 (eight) hours as needed for vomiting or nausea.   oxyCODONE 5 MG immediate release tablet Commonly known as: Oxy IR/ROXICODONE Take 1-2 tablets (5-10 mg total) by mouth every 6 (six) hours as needed for severe pain (pain score 7-10).   PRE-NATAL PO Take 1 tablet by mouth daily.   rizatriptan 10 MG tablet Commonly known as: MAXALT Take 10 mg by mouth as needed (mild migraines). May repeat in 2 hours if needed   sertraline 50 MG tablet Commonly known as: ZOLOFT Take 50 mg by mouth daily.   SUMAtriptan 100 MG tablet Commonly known as: IMITREX Take 50-100 mg by mouth every 2 (two) hours as needed (severe migraines). May repeat in 2 hours if headache persists or recurs.   zolmitriptan 5 MG nasal solution Commonly known as: ZOMIG Place 1 spray into the nose as needed for migraine.               Discharge Care Instructions  (From admission, onward)           Start     Ordered   02/28/23 0000  Discharge wound care:       Comments: Take dressing off on day 5-7 postpartum.  Report increased drainage, redness or warmth. Clean with water, let soap trickle down body. Can leave steri strips on until they fall off or take them off gently at day 10. Keep open to air, clean and dry.   02/28/23 1449            Discharge Follow Up:   Follow-up Information     Gerald Leitz, MD. Schedule an appointment as soon as possible for a visit in 1 week(s).   Specialty: Obstetrics and  Gynecology Why: blood pressure and incision check with Dr. Richardson Dopp in 1 week Contact information: [address] [place] [address] [phone]                  [facility] CNM, FNP-C, PMHNP-BC  [address]  [place], [place] [address]  Cell: [phone]  Office Phone: [phone] Fax: [phone] 02/28/2023  2:50 PM

## 2023-03-01 ENCOUNTER — Inpatient Hospital Stay (HOSPITAL_COMMUNITY): Admit: 2023-03-01 | Payer: Medicaid Other | Admitting: Obstetrics and Gynecology

## 2023-03-02 ENCOUNTER — Encounter: Payer: Medicaid Other | Admitting: Physical Therapy

## 2023-03-05 ENCOUNTER — Telehealth (HOSPITAL_COMMUNITY): Payer: Self-pay | Admitting: *Deleted

## 2023-03-05 NOTE — Telephone Encounter (Signed)
03/05/2023  Name: Hildagarde Bradsher MRN: [id number] DOB: 08/02/1990  Reason for Call:  Transition of Care Hospital Discharge Call  Contact Status: Patient Contact Status: Message  Language assistant needed:          Follow-Up Questions:    Inocente Salles Postnatal Depression Scale:  In the Past 7 Days:    PHQ2-9 Depression Scale:     Discharge Follow-up:    Post-discharge interventions: NA  Salena Saner, RN 03/05/2023  11:52

## 2023-03-09 ENCOUNTER — Encounter: Payer: Medicaid Other | Admitting: Physical Therapy

## 2023-05-24 NOTE — Progress Notes (Unsigned)
Name: [name] [name]  MRN/ DOB: [id number], February 04, 1991    Age/ Sex: 33 y.o., female     PCP: Mila Palmer, MD   Reason for Endocrinology Evaluation: Combined Dyslipidemia      Initial Endocrinology Clinic Visit: 09/27/18    PATIENT IDENTIFIER: [name] is a 33 y.o., female with a past medical history of Combined dyslipidemia, Deletion of SHOX (short stature), joint hypermobility  And NASH (sono 03/2018). She has followed with [facility] since 09/27/18 for consultative assistance with management of her Hyperlipidemia and NASH.     HISTORICAL SUMMARY:  She was diagnosed with elevated TG at age 2 , which was in 800's range, of note the patient was on OCP's at the time for years. She was started on Lovaza at the time 4 grams daily  Which improved her TG levels to 200's. LDL increased was noted in 2016. She has tried Lipitor but made her nauseous and weak, she was then switched to another statin but on her initial visit to our clinic she was on rosuvastatin.     No FH of dyslipidemia on mother side, unknown paternal history.  No FH of early death, or stoke on maternal side.   No Hx of pancreatitis   Her triglycerides improved tremendously to 176 mg/DL  with stopping the combined oral contraceptive pills.  She is S/P C-section 02/02/2020, Girl  Audry    SUBJECTIVE:    Today (06/06/2020):  [name] is here for a follow up on combined hyperlipidemia.   Weight continues to fluctuate  She hurt her back while helping her niece from carousel  She  continues  to nurse  Denies abdominal pain , denies nausea  Denies palpitations    HISTORY:  Past Medical History:  Past Medical History:  Diagnosis Date   Depression    EDS (Ehlers-Danlos syndrome)    Hyperlipidemia    Migraine    POTS (postural orthostatic tachycardia syndrome)    Transaminitis    Vitamin B12 deficiency    Vitamin D deficiency    Past Surgical History:  Past Surgical History:   Procedure Laterality Date   EYE SURGERY     x2   WISDOM TOOTH EXTRACTION     Social History:  reports that she has never smoked. She has never used smokeless tobacco. She reports previous alcohol use. She reports previous drug use. Family History:  Family History  Problem Relation Age of Onset   Hypertension Mother    Anxiety disorder Mother    Other Father        unknown   Osteoarthritis Paternal Grandmother      HOME MEDICATIONS: Allergies as of 06/06/2020   No Known Allergies      Medication List        Accurate as of June 06, 2020  7:08 AM. If you have any questions, ask your nurse or doctor.          Aimovig 70 MG/ML Soaj Generic drug: Erenumab-aooe Inject into the skin every 30 (thirty) days.   ALPRAZolam 0.5 MG tablet Commonly known as: XANAX Take 0.5 mg by mouth 3 (three) times daily as needed.   Azelastine-Fluticasone 137-50 MCG/ACT Susp Place 1 spray into the nose 2 (two) times a day.   baclofen 10 MG tablet Commonly known as: LIORESAL Take 20 mg by mouth 3 (three) times daily as needed.   Botox 200 units Solr Generic drug: Botulinum Toxin Type A Inject as directed every 3 (three) months.  butalbital-acetaminophen-caffeine 50-325-40 MG tablet Commonly known as: FIORICET Take 1 tablet by mouth every 4 (four) hours as needed.   cetirizine 10 MG tablet Commonly known as: ZYRTEC Take 10 mg by mouth daily.   Cyanocobalamin 1000 MCG/ML Kit Inject 1 mL as directed every 30 (thirty) days.   fluticasone 50 MCG/ACT nasal spray Commonly known as: FLONASE Place 1 spray into both nostrils daily.   hydrOXYzine 50 MG tablet Commonly known as: ATARAX/VISTARIL Take 50 mg by mouth every 8 (eight) hours as needed.   ketorolac 60 MG/2ML Soln injection Commonly known as: TORADOL Inject 60 mg into the muscle once as needed.   Melatonin 1 MG Caps Take by mouth.   meloxicam 7.5 MG tablet Commonly known as: MOBIC Take 7.5 mg by mouth daily as  needed.   methylphenidate 5 MG tablet Commonly known as: RITALIN Take 5 mg by mouth daily as needed.   metroNIDAZOLE 0.75 % cream Commonly known as: METROCREAM Apply 1 application topically 2 (two) times daily.   mometasone 0.1 % cream Commonly known as: ELOCON Apply 1 application topically daily as needed.   montelukast 4 MG Pack Commonly known as: SINGULAIR Take 4 mg by mouth daily as needed.   omega-3 acid ethyl esters 1 g capsule Commonly known as: LOVAZA Take 1 g by mouth daily. Take 4 capsules daily   omega-3 acid ethyl esters 1 g capsule Commonly known as: LOVAZA Take 2 g by mouth 2 (two) times a day.   ONE-A-DAY WOMENS 50 PLUS PO Take by mouth.   PRE-NATAL PO Take by mouth.   PROBIOTIC ADVANCED PO Take by mouth.   propranolol 20 MG tablet Commonly known as: INDERAL 20 mg 4 (four) times daily.   sertraline 25 MG tablet Commonly known as: ZOLOFT Take 50 mg by mouth daily.   SUMAtriptan 25 MG tablet Commonly known as: IMITREX Take 100 mg by mouth daily as needed.   tamsulosin 0.4 MG Caps capsule Commonly known as: FLOMAX Take 0.4 mg by mouth.   zolpidem 5 MG tablet Commonly known as: AMBIEN Take 10 mg by mouth at bedtime.   Zomig 5 MG nasal solution Generic drug: zolmitriptan Place 1 spray into the nose daily as needed.          OBJECTIVE:   PHYSICAL EXAM: There were no vitals taken for this visit.   EXAM: General: Pt appears well and is in NAD  Lungs: Clear with good BS bilat with no rales, rhonchi, or wheezes  Heart: Auscultation: RRR.  Abdomen: Normoactive bowel sounds, soft, nontender, without masses or organomegaly palpable  Extremities:  BL LE: No pretibial edema normal ROM and strength.  Mental Status: Judgment, insight: Intact Mood and affect: No depression, anxiety, or agitation     DATA REVIEWED:   Latest Reference Range & Units 02/25/22 10:34  Sodium 135 - 145 mEq/L 138  Potassium 3.5 - 5.1 mEq/L 4.1  Chloride 96  - 112 mEq/L 104  CO2 19 - 32 mEq/L 28  Glucose 70 - 99 mg/dL 99  BUN 6 - 23 mg/dL 14  Creatinine 3.32 - 9.51 mg/dL 8.84  Calcium 8.4 - 16.6 mg/dL 9.2  Alkaline Phosphatase 39 - 117 U/L 67  Albumin 3.5 - 5.2 g/dL 4.5  AST 0 - 37 U/L 26  ALT 0 - 35 U/L 33  Total Protein 6.0 - 8.3 g/dL 7.2  Total Bilirubin 0.2 - 1.2 mg/dL 0.4  GFR >06.30 mL/min 116.56  Total CHOL/HDL Ratio  7  Cholesterol 0 -  200 mg/dL 098 (H)  HDL Cholesterol >39.00 mg/dL 11.91  Direct LDL mg/dL 478.2  Triglycerides 0.0 - 149.0 mg/dL 956.2 Triglyceride is over 400; calculations on Lipids are invalid. (H)    Latest Reference Range & Units 02/25/22 10:34  TSH 0.35 - 5.50 uIU/mL 1.09     06/03/2018 Alp 47 U/L  AST 90  U/L  Alt 89 U/L T. Chol 286 mg/dL TG 130 HDL 41  LDL 865 mg/dL      ASSESSMENT / PLAN / RECOMMENDATIONS:   Combined Hyperlipidemia :   - Pt has no stigmata of hypercholesterolemia - No Hx of premature CAD , CVA or pancreatitis  - Repeat labs shows improvement of LDL but Tg is elevated, part of this is due to the fact that she was not fasting when labs were drawn.Marland Kitchen Unfortunately STATIN  therapy is not recommended during nursing phase . There's limited data on Fenofibrates. I am going to again recommend STRICT  DIETARY changes again , with low fat diet , eating more fruits and vegetables and fatty fish    F/U in 1 yr    Abby Raelyn Mora, MD  [facility] Endocrinology  [facility] [address] [place], [place] [address] Phone: [phone] FAX: [phone]

## 2023-05-25 ENCOUNTER — Other Ambulatory Visit: Payer: Medicaid Other

## 2023-05-25 LAB — COMPREHENSIVE METABOLIC PANEL
AG Ratio: 2 (calc) (ref 1.0–2.5)
ALT: 32 U/L — ABNORMAL HIGH (ref 6–29)
AST: 17 U/L (ref 10–30)
Albumin: 4.7 g/dL (ref 3.6–5.1)
Alkaline phosphatase (APISO): 64 U/L (ref 31–125)
BUN: 19 mg/dL (ref 7–25)
CO2: 25 mmol/L (ref 20–32)
Calcium: 9.7 mg/dL (ref 8.6–10.2)
Chloride: 107 mmol/L (ref 98–110)
Creat: 0.75 mg/dL (ref 0.50–0.97)
Globulin: 2.3 g/dL (ref 1.9–3.7)
Glucose, Bld: 102 mg/dL — ABNORMAL HIGH (ref 65–99)
Potassium: 4.6 mmol/L (ref 3.5–5.3)
Sodium: 140 mmol/L (ref 135–146)
Total Bilirubin: 0.5 mg/dL (ref 0.2–1.2)
Total Protein: 7 g/dL (ref 6.1–8.1)

## 2023-05-25 LAB — LIPID PANEL
Cholesterol: 262 mg/dL — ABNORMAL HIGH (ref <200)
HDL: 43 mg/dL — ABNORMAL LOW (ref >=50)
LDL Cholesterol (Calc): 173 mg/dL — ABNORMAL HIGH
Non-HDL Cholesterol (Calc): 219 mg/dL — ABNORMAL HIGH (ref <130)
Total CHOL/HDL Ratio: 6.1 (calc) — ABNORMAL HIGH (ref <5.0)
Triglycerides: 283 mg/dL — ABNORMAL HIGH (ref <150)

## 2023-05-25 LAB — T4, FREE: Free T4: 1 ng/dL (ref 0.8–1.8)

## 2023-05-25 LAB — TSH: TSH: 1.16 m[IU]/L

## 2023-05-26 ENCOUNTER — Ambulatory Visit (INDEPENDENT_AMBULATORY_CARE_PROVIDER_SITE_OTHER): Payer: Medicaid Other | Admitting: Internal Medicine

## 2023-05-26 ENCOUNTER — Encounter: Payer: Self-pay | Admitting: Internal Medicine

## 2023-05-26 ENCOUNTER — Telehealth: Payer: Self-pay | Admitting: Internal Medicine

## 2023-05-26 VITALS — Ht <= 58 in

## 2023-05-26 DIAGNOSIS — E782 Mixed hyperlipidemia: Secondary | ICD-10-CM

## 2023-05-26 NOTE — Telephone Encounter (Signed)
Please schedule an appointment in 1 yr with me   Thanks

## 2023-11-18 ENCOUNTER — Ambulatory Visit

## 2023-11-21 NOTE — Therapy (Signed)
 OUTPATIENT PHYSICAL THERAPY CERVICAL EVALUATION   Patient Name: [name] [name] MRN: [id number] DOB:15-May-1990, 33 y.o., female Today's Date: 11/22/2023  END OF SESSION:  PT End of Session - 11/22/23 1536     Visit Number 1    Date for PT Re-Evaluation 01/17/24    Authorization Type [employer]  auth pending (requested 16 visit 8/18 to 1013)    PT Start Time 1537    PT Stop Time 1611    PT Time Calculation (min) 34 min    Activity Tolerance Patient tolerated treatment well    Behavior During Therapy WFL for tasks assessed/performed          Past Medical History:  Diagnosis Date   Anxiety    Asthma    Depression    EDS (Ehlers-Danlos syndrome)    Hyperlipidemia    Migraine    Ovarian cyst    POTS (postural orthostatic tachycardia syndrome)    POTS (postural orthostatic tachycardia syndrome)    Preeclampsia, severe    Transaminitis    Vitamin B12 deficiency    Vitamin D deficiency    Past Surgical History:  Procedure Laterality Date   CESAREAN SECTION     CESAREAN SECTION WITH BILATERAL TUBAL LIGATION Bilateral 02/24/2023   Procedure: REPEAT CESAREAN SECTION WITH BILATERAL TUBAL LIGATION BY SALPINGECTOMY;  Surgeon: Gloriann Chick, MD;  Location: [facility];  Service: Obstetrics;  Laterality: Bilateral;   EYE SURGERY     x2   WISDOM TOOTH EXTRACTION     Patient Active Problem List   Diagnosis Date Noted   S/P cesarean section 02/28/2023   Acute blood loss anemia 02/28/2023   H/O anxiety disorder 02/28/2023   H/O: depression 02/28/2023   Chronic hypertension with superimposed preeclampsia 02/24/2023   POTS (postural orthostatic tachycardia syndrome) 01/28/2023   Obesity affecting pregnancy, antepartum 01/28/2023   Pregnancy with history of cesarean section, antepartum 01/28/2023   History of pre-eclampsia in prior pregnancy, currently pregnant 01/28/2023   Primary hypertension 06/07/2020   NASH (nonalcoholic steatohepatitis) 06/26/2019   Combined hyperlipidemia  06/26/2019    PCP: Verena Mems, MD   REFERRING PROVIDER: Chrystal Lamarr RAMAN, *   REFERRING DIAG:  H55.790 (ICD-10-CM) - Tension-type headache, unspecified, not intractable  M54.2 (ICD-10-CM) - Cervicalgia    THERAPY DIAG:  Cervicalgia  Muscle weakness (generalized)  Cramp and spasm  Other low back pain  Rationale for Evaluation and Treatment: Rehabilitation  ONSET DATE: May 2025  SUBJECTIVE:  SUBJECTIVE STATEMENT: Had baby at the end of November. My pelvic floor is weak and I'm getting low back pain as well. When my low back is hurting my head feels heavy and my neck feels stuck. Sometimes I do exercises the bands and weights. I've never had the sharp pains and not being able to move my neck before. This started 2-3 months ago. Low back pain is new since this latest pregnancy. I still do my neck stretches and exercises from last episode. Driving limited to 89-84 min. Migraines usually are R sided, but her last one was L sided. Hand dominance: Right  PERTINENT HISTORY:  2 c-sections (26 month old and 34 year old), POTS (hyperadrenic), peripheral neuropathy  PAIN:  Are you having pain? Yes: NPRS scale: 3-4 in morning up to 5-6/10 at end of day. Worse with migraines  Pain location: B cspine Pain description: heavy and stuck Aggravating factors: unsure Relieving factors: stretching, exercises, heat/ice, picking up kids aggravates R side  PRECAUTIONS: Other: POTS - h/o of passing out  RED FLAGS: None     WEIGHT BEARING RESTRICTIONS: No  FALLS:  Has patient fallen in last 6 months? No  LIVING ENVIRONMENT: Lives with: lives with their family   OCCUPATION: stay at home mom  PLOF: Independent  PATIENT GOALS:   NEXT MD VISIT: none scheduled  OBJECTIVE:  Note:  Objective measures were completed at Evaluation unless otherwise noted.  DIAGNOSTIC FINDINGS:  none  PATIENT SURVEYS:  NDI:  NECK DISABILITY INDEX  Date: 11/22/23 Score  Pain intensity 2 = The pain is moderate at the moment  2. Personal care (washing, dressing, etc.) 2 = It is painful to look after myself and I am slow and careful  3. Lifting 1 =  I can lift heavy weights but it gives extra pain  4. Reading 2 =  I can read as much as I want with moderate pain in my neck  5. Headaches 5 = I have headaches almost all the time  6. Concentration 2 = I have a fair degree of difficulty in concentrating when I want to  7. Work 3 =  I cannot do my usual work  8. Driving 3 = I can't drive my car as long as I want because of moderate pain in my neck  9. Sleeping 3 =  My sleep is moderately disturbed (2-3 hrs sleepless)  10. Recreation 3 = I am able to engage in a few of my usual recreation activities because of pain in   my neck  Total 26/50   Minimum Detectable Change (90% confidence): 5 points or 10% points  COGNITION: Overall cognitive status: Within functional limits for tasks assessed  SENSATION: WFL some N/t in the back of her legs.   POSTURE: No Significant postural limitations  PALPATION: L UT, R paraspinals, L suboccipitals    CERVICAL ROM:   Active ROM A/PROM (deg) eval  Flexion Full P!  Extension full  Right lateral flexion WNL P!  Left lateral flexion WNL P!  Right rotation WNL P!  Left rotation 75% P!   (Blank rows = not tested)  UPPER EXTREMITY ROM: WNL  UPPER EXTREMITY MMT:  MMT Right eval Left eval  Shoulder flexion 4+ 4+P!  Shoulder extension    Shoulder abduction 5 5  Shoulder adduction    Shoulder extension    Shoulder internal rotation    Shoulder external rotation    Middle trapezius 5 4  Lower trapezius 4- 3+   (  Blank rows = not tested)  Rhomboids 4-4+/5   TREATMENT DATE:                                                                                                                                11/22/23 See pt ed and HEP  Seated isometrics ext, lateral SB and rotation MFR using small peanut on subocciptals     PATIENT EDUCATION:  Education details: PT eval findings, anticipated POC, initial HEP, and self-STM techniques to suboccipitals using small peanut ball   Person educated: Patient Education method: Explanation, Demonstration, Tactile cues, Verbal cues, and Handouts Education comprehension: verbalized understanding and returned demonstration  HOME EXERCISE PROGRAM: Access Code: [email or web address] URL: [email or web address] Date: 11/22/2023 Prepared by: Mliss  Exercises - Standing Isometric Cervical Sidebending with Manual Resistance  - 1 x daily - 7 x weekly - 1 sets - 5 reps - 10 sec hold hold - Standing Isometric Cervical Flexion with Manual Resistance  - 1 x daily - 7 x weekly - 1 sets - 5 reps - 10 sec hold - Standing Isometric Cervical Extension with Manual Resistance  - 1 x daily - 7 x weekly - 1 sets - 5 reps - 10 sec hold - Seated Isometric Cervical Rotation  - 1 x daily - 7 x weekly - 1 sets - 5 reps - 10 sec hold  MFR using small peanut on subocciptals  ASSESSMENT:  CLINICAL IMPRESSION: Patient is a 33 y.o. female who was seen today for physical therapy evaluation and treatment for neck pain and headaches. She has a h/o of migraines, EDS, and POTs. Her pain started 2-3 months ago and she has periods where her neck feels stuck and heavy. She has decreased ROM for her, given her hypermobility, and pain with all motions except extension. She has increased muscle tension B and weakness in her upper back muscles. She also reports pelvic floor weakness and LBP and plans to pursue an order for these. She will benefit from skilled PT to address these deficits and those listed below.   OBJECTIVE IMPAIRMENTS: decreased activity tolerance, decreased ROM, decreased strength, increased muscle spasms, impaired  flexibility, and pain.   ACTIVITY LIMITATIONS: carrying, lifting, sleeping, bathing, dressing, hygiene/grooming, and caring for others  PARTICIPATION LIMITATIONS: meal prep, cleaning, laundry, driving, shopping, and yard work  PERSONAL FACTORS: Fitness, Past/current experiences, Time since onset of injury/illness/exacerbation, and 1-2 comorbidities: POTS and EDS are also affecting patient's functional outcome.   REHAB POTENTIAL: Excellent  CLINICAL DECISION MAKING: Evolving/moderate complexity  EVALUATION COMPLEXITY: Low   GOALS: Goals reviewed with patient? Yes  SHORT TERM GOALS: Target date: 12/20/2023   Patient will be independent with initial HEP.  Baseline:  Goal status: INITIAL 2.  Decreased neck pain by 25% or more with ADLs  Baseline: 6/10 Goal status: INITIAL  3.  Decreased frequency and intensity of HA by 25% to improved QOL Baseline: daily Goal status: INITIAL   LONG TERM GOALS:  Target date: 01/17/2024   Patient will be independent with advanced/ongoing HEP to improve outcomes and carryover.  Baseline: initial HEP Goal status: INITIAL  2.  Patient will report 75% improvement in neck pain to improve QOL.  Baseline: 6/10 Goal status: INITIAL  3.  Patient will demonstrate full pain free cervical ROM for safety with driving.  Baseline: pain in all motions but extension Goal status:INITIAL  4.  Patient will demonstrate improved mid and low trap strength to 4+/5 or better to improve posture and strain on neck.  Baseline: see chart Goal status:INITIAL  5.  Patient will report 21 or less on NDI to demonstrate improved functional ability.  Baseline: 26 Goal status: INITIAL    PLAN:  PT FREQUENCY: 1-2x/week  PT DURATION: 8 weeks  PLANNED INTERVENTIONS: 97164- PT Re-evaluation, 97110-Therapeutic exercises, 97530- Therapeutic activity, 97112- Neuromuscular re-education, 97535- Self Care, 02859- Manual therapy, G0283- Electrical stimulation (unattended),  [phone]- Traction (mechanical), [id number]- Ionotophoresis 4mg /ml Dexamethasone , 79439 (1-2 muscles), 20561 (3+ muscles)- Dry Needling, Patient/Family education, Taping, Joint mobilization, Spinal mobilization, Cryotherapy, and Moist heat  PLAN FOR NEXT SESSION: Review and progress HEP, manual/DN to neck/UT, spinal stability, try JENICE Mliss Cummins, PT 11/22/23 5:57 PM

## 2023-11-22 ENCOUNTER — Other Ambulatory Visit: Payer: Self-pay

## 2023-11-22 ENCOUNTER — Encounter: Payer: Self-pay | Admitting: Physical Therapy

## 2023-11-22 ENCOUNTER — Ambulatory Visit: Attending: Family Medicine | Admitting: Physical Therapy

## 2023-11-22 DIAGNOSIS — M542 Cervicalgia: Secondary | ICD-10-CM | POA: Diagnosis present

## 2023-11-22 DIAGNOSIS — M5459 Other low back pain: Secondary | ICD-10-CM | POA: Insufficient documentation

## 2023-11-22 DIAGNOSIS — R519 Headache, unspecified: Secondary | ICD-10-CM | POA: Insufficient documentation

## 2023-11-22 DIAGNOSIS — M6281 Muscle weakness (generalized): Secondary | ICD-10-CM | POA: Diagnosis present

## 2023-11-22 DIAGNOSIS — R252 Cramp and spasm: Secondary | ICD-10-CM | POA: Insufficient documentation

## 2023-11-22 DIAGNOSIS — M357 Hypermobility syndrome: Secondary | ICD-10-CM | POA: Diagnosis present

## 2023-11-22 DIAGNOSIS — G8929 Other chronic pain: Secondary | ICD-10-CM | POA: Insufficient documentation

## 2023-11-26 ENCOUNTER — Encounter: Payer: Self-pay | Admitting: Physical Therapy

## 2023-11-26 ENCOUNTER — Ambulatory Visit: Admitting: Physical Therapy

## 2023-11-26 DIAGNOSIS — M5459 Other low back pain: Secondary | ICD-10-CM

## 2023-11-26 DIAGNOSIS — M6281 Muscle weakness (generalized): Secondary | ICD-10-CM

## 2023-11-26 DIAGNOSIS — M542 Cervicalgia: Secondary | ICD-10-CM

## 2023-11-26 DIAGNOSIS — R252 Cramp and spasm: Secondary | ICD-10-CM

## 2023-11-26 NOTE — Therapy (Signed)
 OUTPATIENT PHYSICAL THERAPY CERVICAL TREATMENT   Patient Name: [name] [name] MRN: [id number] DOB:04/16/1990, 33 y.o., female Today's Date: 11/26/2023  END OF SESSION:  PT End of Session - 11/26/23 0850     Visit Number 2    Date for PT Re-Evaluation 01/17/24    Authorization Type [employer]  auth pending (requested 16 visit 8/18 to 1013)    PT Start Time 0848    PT Stop Time 0929    PT Time Calculation (min) 41 min    Activity Tolerance Patient tolerated treatment well    Behavior During Therapy WFL for tasks assessed/performed           Past Medical History:  Diagnosis Date   Anxiety    Asthma    Depression    EDS (Ehlers-Danlos syndrome)    Hyperlipidemia    Migraine    Ovarian cyst    POTS (postural orthostatic tachycardia syndrome)    POTS (postural orthostatic tachycardia syndrome)    Preeclampsia, severe    Transaminitis    Vitamin B12 deficiency    Vitamin D deficiency    Past Surgical History:  Procedure Laterality Date   CESAREAN SECTION     CESAREAN SECTION WITH BILATERAL TUBAL LIGATION Bilateral 02/24/2023   Procedure: REPEAT CESAREAN SECTION WITH BILATERAL TUBAL LIGATION BY SALPINGECTOMY;  Surgeon: Gloriann Chick, MD;  Location: [facility];  Service: Obstetrics;  Laterality: Bilateral;   EYE SURGERY     x2   WISDOM TOOTH EXTRACTION     Patient Active Problem List   Diagnosis Date Noted   S/P cesarean section 02/28/2023   Acute blood loss anemia 02/28/2023   H/O anxiety disorder 02/28/2023   H/O: depression 02/28/2023   Chronic hypertension with superimposed preeclampsia 02/24/2023   POTS (postural orthostatic tachycardia syndrome) 01/28/2023   Obesity affecting pregnancy, antepartum 01/28/2023   Pregnancy with history of cesarean section, antepartum 01/28/2023   History of pre-eclampsia in prior pregnancy, currently pregnant 01/28/2023   Primary hypertension 06/07/2020   NASH (nonalcoholic steatohepatitis) 06/26/2019   Combined  hyperlipidemia 06/26/2019    PCP: Verena Mems, MD   REFERRING PROVIDER: Chrystal Lamarr RAMAN, *   REFERRING DIAG:  H55.790 (ICD-10-CM) - Tension-type headache, unspecified, not intractable  M54.2 (ICD-10-CM) - Cervicalgia    THERAPY DIAG:  Cervicalgia  Muscle weakness (generalized)  Cramp and spasm  Other low back pain  Rationale for Evaluation and Treatment: Rehabilitation  ONSET DATE: May 2025  SUBJECTIVE:  SUBJECTIVE STATEMENT: I had a migraine last night. It's better this morning.  Eval: Had baby at the end of November. My pelvic floor is weak and I'm getting low back pain as well. When my low back is hurting my head feels heavy and my neck feels stuck. Sometimes I do exercises the bands and weights. I've never had the sharp pains and not being able to move my neck before. This started 2-3 months ago. Low back pain is new since this latest pregnancy. I still do my neck stretches and exercises from last episode. Driving limited to 89-84 min. Migraines usually are R sided, but her last one was L sided. Hand dominance: Right  PERTINENT HISTORY:  2 c-sections (32 month old and 34 year old), POTS (hyperadrenic), peripheral neuropathy  PAIN:  Are you having pain? Yes: NPRS scale: 3-4 in morning up to 5-6/10 at end of day. Worse with migraines  Pain location: B cspine Pain description: heavy and stuck Aggravating factors: unsure Relieving factors: stretching, exercises, heat/ice, picking up kids aggravates R side  PRECAUTIONS: Other: POTS - h/o of passing out  RED FLAGS: None     WEIGHT BEARING RESTRICTIONS: No  FALLS:  Has patient fallen in last 6 months? No  LIVING ENVIRONMENT: Lives with: lives with their family   OCCUPATION: stay at home mom  PLOF:  Independent  PATIENT GOALS:   NEXT MD VISIT: none scheduled  OBJECTIVE:  Note: Objective measures were completed at Evaluation unless otherwise noted.  DIAGNOSTIC FINDINGS:  none  PATIENT SURVEYS:  NDI:  NECK DISABILITY INDEX  Date: 11/22/23 Score  Pain intensity 2 = The pain is moderate at the moment  2. Personal care (washing, dressing, etc.) 2 = It is painful to look after myself and I am slow and careful  3. Lifting 1 =  I can lift heavy weights but it gives extra pain  4. Reading 2 =  I can read as much as I want with moderate pain in my neck  5. Headaches 5 = I have headaches almost all the time  6. Concentration 2 = I have a fair degree of difficulty in concentrating when I want to  7. Work 3 =  I cannot do my usual work  8. Driving 3 = I can't drive my car as long as I want because of moderate pain in my neck  9. Sleeping 3 =  My sleep is moderately disturbed (2-3 hrs sleepless)  10. Recreation 3 = I am able to engage in a few of my usual recreation activities because of pain in   my neck  Total 26/50   Minimum Detectable Change (90% confidence): 5 points or 10% points  COGNITION: Overall cognitive status: Within functional limits for tasks assessed  SENSATION: WFL some N/t in the back of her legs.   POSTURE: No Significant postural limitations  PALPATION: L UT, R paraspinals, L suboccipitals    CERVICAL ROM:   Active ROM A/PROM (deg) eval  Flexion Full P!  Extension full  Right lateral flexion WNL P!  Left lateral flexion WNL P!  Right rotation WNL P!  Left rotation 75% P!   (Blank rows = not tested)  UPPER EXTREMITY ROM: WNL  UPPER EXTREMITY MMT:  MMT Right eval Left eval  Shoulder flexion 4+ 4+P!  Shoulder extension    Shoulder abduction 5 5  Shoulder adduction    Shoulder extension    Shoulder internal rotation    Shoulder external rotation  Middle trapezius 5 4  Lower trapezius 4- 3+   (Blank rows = not tested)  Rhomboids  4-4+/5   TREATMENT DATE:                                                                                                                               11/26/23 UBE x 6 min 3 min fwd/3bwd Melt with roller flex/ext and rotation x 15 ea Prone on elbows chin tuck 5 sec hold x 10 Prone on elbows diagonals x 10 B e Prone over small plyo ball: T and Y x 10 ea, Y to W to Y x 10 Isometrics: reviewed HEP  Manual: STM to B UT, Levator, cervical paraspinals and suboccipitals. OA release.    11/22/23 See pt ed and HEP  Seated isometrics ext, lateral SB and rotation MFR using small peanut on subocciptals     PATIENT EDUCATION:  Education details: PT eval findings, anticipated POC, initial HEP, and self-STM techniques to suboccipitals using small peanut ball   Person educated: Patient Education method: Explanation, Demonstration, Tactile cues, Verbal cues, and Handouts Education comprehension: verbalized understanding and returned demonstration  HOME EXERCISE PROGRAM: Access Code: [email or web address] URL: [email or web address] Date: 11/26/2023 Prepared by: Mliss  Exercises - Standing Isometric Cervical Sidebending with Manual Resistance  - 1 x daily - 7 x weekly - 1 sets - 5 reps - 10 sec hold hold - Standing Isometric Cervical Flexion with Manual Resistance  - 1 x daily - 7 x weekly - 1 sets - 5 reps - 10 sec hold - Standing Isometric Cervical Extension with Manual Resistance  - 1 x daily - 7 x weekly - 1 sets - 5 reps - 10 sec hold - Seated Isometric Cervical Rotation  - 1 x daily - 7 x weekly - 1 sets - 5 reps - 10 sec hold - Cervical Prone on Elbows Diagonals  - 2 x daily - 7 x weekly - 1-2 sets - 10 reps  MFR using small peanut on subocciptals  ASSESSMENT:  CLINICAL IMPRESSION: Patient responded well to all exercise today. Decreased pain with left rotation following prone on elbow diagonals. Tighter in R levator, cspine and SO muscles today compared to L. Excellent response to  manual therapy with decreased pain reported at end of session.  OBJECTIVE IMPAIRMENTS: decreased activity tolerance, decreased ROM, decreased strength, increased muscle spasms, impaired flexibility, and pain.   ACTIVITY LIMITATIONS: carrying, lifting, sleeping, bathing, dressing, hygiene/grooming, and caring for others  PARTICIPATION LIMITATIONS: meal prep, cleaning, laundry, driving, shopping, and yard work  PERSONAL FACTORS: Fitness, Past/current experiences, Time since onset of injury/illness/exacerbation, and 1-2 comorbidities: POTS and EDS are also affecting patient's functional outcome.   REHAB POTENTIAL: Excellent  CLINICAL DECISION MAKING: Evolving/moderate complexity  EVALUATION COMPLEXITY: Low   GOALS: Goals reviewed with patient? Yes  SHORT TERM GOALS: Target date: 12/20/2023   Patient will be independent with initial HEP.  Baseline:  Goal status: INITIAL 2.  Decreased neck pain by 25% or more with ADLs  Baseline: 6/10 Goal status: INITIAL  3.  Decreased frequency and intensity of HA by 25% to improved QOL Baseline: daily Goal status: INITIAL   LONG TERM GOALS: Target date: 01/17/2024   Patient will be independent with advanced/ongoing HEP to improve outcomes and carryover.  Baseline: initial HEP Goal status: INITIAL  2.  Patient will report 75% improvement in neck pain to improve QOL.  Baseline: 6/10 Goal status: INITIAL  3.  Patient will demonstrate full pain free cervical ROM for safety with driving.  Baseline: pain in all motions but extension Goal status:INITIAL  4.  Patient will demonstrate improved mid and low trap strength to 4+/5 or better to improve posture and strain on neck.  Baseline: see chart Goal status:INITIAL  5.  Patient will report 21 or less on NDI to demonstrate improved functional ability.  Baseline: 26 Goal status: INITIAL    PLAN:  PT FREQUENCY: 1-2x/week  PT DURATION: 8 weeks  PLANNED INTERVENTIONS: 97164- PT  Re-evaluation, 97110-Therapeutic exercises, 97530- Therapeutic activity, 97112- Neuromuscular re-education, 97535- Self Care, 02859- Manual therapy, G0283- Electrical stimulation (unattended), [phone]- Traction (mechanical), [id number]- Ionotophoresis 4mg /ml Dexamethasone , 79439 (1-2 muscles), 20561 (3+ muscles)- Dry Needling, Patient/Family education, Taping, Joint mobilization, Spinal mobilization, Cryotherapy, and Moist heat  PLAN FOR NEXT SESSION: Review and progress HEP, manual/DN to neck/UT, spinal stability    Mliss Cummins, PT  11/26/23 9:33 AM

## 2023-11-30 ENCOUNTER — Ambulatory Visit

## 2023-11-30 DIAGNOSIS — M6281 Muscle weakness (generalized): Secondary | ICD-10-CM

## 2023-11-30 DIAGNOSIS — M357 Hypermobility syndrome: Secondary | ICD-10-CM

## 2023-11-30 DIAGNOSIS — R252 Cramp and spasm: Secondary | ICD-10-CM

## 2023-11-30 DIAGNOSIS — R519 Headache, unspecified: Secondary | ICD-10-CM

## 2023-11-30 DIAGNOSIS — M542 Cervicalgia: Secondary | ICD-10-CM

## 2023-11-30 NOTE — Therapy (Signed)
 OUTPATIENT PHYSICAL THERAPY CERVICAL TREATMENT   Patient Name: [name] [name] MRN: [id number] DOB:1990/09/18, 33 y.o., female Today's Date: 11/30/2023  END OF SESSION:  PT End of Session - 11/30/23 0937     Visit Number 3    Date for PT Re-Evaluation 01/17/24    Authorization Type [employer]  auth pending (requested 16 visit 8/18 to 1013)    PT Start Time 0937    PT Stop Time 1015    PT Time Calculation (min) 38 min    Activity Tolerance Patient tolerated treatment well    Behavior During Therapy WFL for tasks assessed/performed           Past Medical History:  Diagnosis Date   Anxiety    Asthma    Depression    EDS (Ehlers-Danlos syndrome)    Hyperlipidemia    Migraine    Ovarian cyst    POTS (postural orthostatic tachycardia syndrome)    POTS (postural orthostatic tachycardia syndrome)    Preeclampsia, severe    Transaminitis    Vitamin B12 deficiency    Vitamin D deficiency    Past Surgical History:  Procedure Laterality Date   CESAREAN SECTION     CESAREAN SECTION WITH BILATERAL TUBAL LIGATION Bilateral 02/24/2023   Procedure: REPEAT CESAREAN SECTION WITH BILATERAL TUBAL LIGATION BY SALPINGECTOMY;  Surgeon: Gloriann Chick, MD;  Location: [facility];  Service: Obstetrics;  Laterality: Bilateral;   EYE SURGERY     x2   WISDOM TOOTH EXTRACTION     Patient Active Problem List   Diagnosis Date Noted   S/P cesarean section 02/28/2023   Acute blood loss anemia 02/28/2023   H/O anxiety disorder 02/28/2023   H/O: depression 02/28/2023   Chronic hypertension with superimposed preeclampsia 02/24/2023   POTS (postural orthostatic tachycardia syndrome) 01/28/2023   Obesity affecting pregnancy, antepartum 01/28/2023   Pregnancy with history of cesarean section, antepartum 01/28/2023   History of pre-eclampsia in prior pregnancy, currently pregnant 01/28/2023   Primary hypertension 06/07/2020   NASH (nonalcoholic steatohepatitis) 06/26/2019   Combined  hyperlipidemia 06/26/2019    PCP: Verena Mems, MD   REFERRING PROVIDER: Chrystal Lamarr RAMAN, *   REFERRING DIAG:  H55.790 (ICD-10-CM) - Tension-type headache, unspecified, not intractable  M54.2 (ICD-10-CM) - Cervicalgia    THERAPY DIAG:  Cervicalgia  Muscle weakness (generalized)  Cramp and spasm  Chronic nonintractable headache, unspecified headache type  Hypermobility syndrome  Rationale for Evaluation and Treatment: Rehabilitation  ONSET DATE: May 2025  SUBJECTIVE:  SUBJECTIVE STATEMENT: Patient reports she is doing ok.  She states she is still having some headaches.  Some days are worse.   Eval: Had baby at the end of November. My pelvic floor is weak and I'm getting low back pain as well. When my low back is hurting my head feels heavy and my neck feels stuck. Sometimes I do exercises the bands and weights. I've never had the sharp pains and not being able to move my neck before. This started 2-3 months ago. Low back pain is new since this latest pregnancy. I still do my neck stretches and exercises from last episode. Driving limited to 89-84 min. Migraines usually are R sided, but her last one was L sided. Hand dominance: Right  PERTINENT HISTORY:  2 c-sections (81 month old and 34 year old), POTS (hyperadrenic), peripheral neuropathy  PAIN:  Are you having pain? Yes: NPRS scale: 3-4 in morning up to 5-6/10 at end of day. Worse with migraines  Pain location: B cspine Pain description: heavy and stuck Aggravating factors: unsure Relieving factors: stretching, exercises, heat/ice, picking up kids aggravates R side  PRECAUTIONS: Other: POTS - h/o of passing out  RED FLAGS: None     WEIGHT BEARING RESTRICTIONS: No  FALLS:  Has patient fallen in last 6 months?  No  LIVING ENVIRONMENT: Lives with: lives with their family   OCCUPATION: stay at home mom  PLOF: Independent  PATIENT GOALS:   NEXT MD VISIT: none scheduled  OBJECTIVE:  Note: Objective measures were completed at Evaluation unless otherwise noted.  DIAGNOSTIC FINDINGS:  none  PATIENT SURVEYS:  NDI:  NECK DISABILITY INDEX  Date: 11/22/23 Score  Pain intensity 2 = The pain is moderate at the moment  2. Personal care (washing, dressing, etc.) 2 = It is painful to look after myself and I am slow and careful  3. Lifting 1 =  I can lift heavy weights but it gives extra pain  4. Reading 2 =  I can read as much as I want with moderate pain in my neck  5. Headaches 5 = I have headaches almost all the time  6. Concentration 2 = I have a fair degree of difficulty in concentrating when I want to  7. Work 3 =  I cannot do my usual work  8. Driving 3 = I can't drive my car as long as I want because of moderate pain in my neck  9. Sleeping 3 =  My sleep is moderately disturbed (2-3 hrs sleepless)  10. Recreation 3 = I am able to engage in a few of my usual recreation activities because of pain in   my neck  Total 26/50   Minimum Detectable Change (90% confidence): 5 points or 10% points  COGNITION: Overall cognitive status: Within functional limits for tasks assessed  SENSATION: WFL some N/t in the back of her legs.   POSTURE: No Significant postural limitations  PALPATION: L UT, R paraspinals, L suboccipitals    CERVICAL ROM:   Active ROM A/PROM (deg) eval  Flexion Full P!  Extension full  Right lateral flexion WNL P!  Left lateral flexion WNL P!  Right rotation WNL P!  Left rotation 75% P!   (Blank rows = not tested)  UPPER EXTREMITY ROM: WNL  UPPER EXTREMITY MMT:  MMT Right eval Left eval  Shoulder flexion 4+ 4+P!  Shoulder extension    Shoulder abduction 5 5  Shoulder adduction    Shoulder extension  Shoulder internal rotation    Shoulder external  rotation    Middle trapezius 5 4  Lower trapezius 4- 3+   (Blank rows = not tested)  Rhomboids 4-4+/5   TREATMENT DATE:                                                                                                                               11/30/23 UBE x 6 min 3 min fwd/3bwd 3 way scapular stabilization with blue loop x 10 each side 4D ball rolls with 2 lb plyo ball x 20 each direction both UE Prone wing taps 2 x 10  Prone overhead reach and pull down 2 x 10 Prone on elbows chin tuck 5 sec hold x 10 Prone on elbows diagonals x 10 each side Melt with roller flex/ext and rotation x 15 ea Manual: STM to B UT, Levator, cervical paraspinals and suboccipitals. Sub occipital release.  11/26/23 UBE x 6 min 3 min fwd/3bwd Melt with roller flex/ext and rotation x 15 ea Prone on elbows chin tuck 5 sec hold x 10 Prone on elbows diagonals x 10 B e Prone over small plyo ball: T and Y x 10 ea, Y to W to Y x 10 Isometrics: reviewed HEP  Manual: STM to B UT, Levator, cervical paraspinals and suboccipitals. OA release.    11/22/23 See pt ed and HEP  Seated isometrics ext, lateral SB and rotation MFR using small peanut on subocciptals     PATIENT EDUCATION:  Education details: PT eval findings, anticipated POC, initial HEP, and self-STM techniques to suboccipitals using small peanut ball   Person educated: Patient Education method: Explanation, Demonstration, Tactile cues, Verbal cues, and Handouts Education comprehension: verbalized understanding and returned demonstration  HOME EXERCISE PROGRAM: Access Code: [email or web address] URL: [email or web address] Date: 11/26/2023 Prepared by: Mliss  Exercises - Standing Isometric Cervical Sidebending with Manual Resistance  - 1 x daily - 7 x weekly - 1 sets - 5 reps - 10 sec hold hold - Standing Isometric Cervical Flexion with Manual Resistance  - 1 x daily - 7 x weekly - 1 sets - 5 reps - 10 sec hold - Standing Isometric Cervical  Extension with Manual Resistance  - 1 x daily - 7 x weekly - 1 sets - 5 reps - 10 sec hold - Seated Isometric Cervical Rotation  - 1 x daily - 7 x weekly - 1 sets - 5 reps - 10 sec hold - Cervical Prone on Elbows Diagonals  - 2 x daily - 7 x weekly - 1-2 sets - 10 reps  MFR using small peanut on subocciptals  ASSESSMENT:  CLINICAL IMPRESSION: Patient had some relief after last session and reported decreased pain.  She reports, however, that the headaches persist.  She was able to do some challenging additional prone exercises today without increased pain.  She is quite hypermobile with excessive cervical mobility but tight upper traps and levator bilaterally.  She would  benefit from continued skilled PT for cervical stabilization, shoulder and scapular stabilizaiton and manual techniques for tissue mobilization.    OBJECTIVE IMPAIRMENTS: decreased activity tolerance, decreased ROM, decreased strength, increased muscle spasms, impaired flexibility, and pain.   ACTIVITY LIMITATIONS: carrying, lifting, sleeping, bathing, dressing, hygiene/grooming, and caring for others  PARTICIPATION LIMITATIONS: meal prep, cleaning, laundry, driving, shopping, and yard work  PERSONAL FACTORS: Fitness, Past/current experiences, Time since onset of injury/illness/exacerbation, and 1-2 comorbidities: POTS and EDS are also affecting patient's functional outcome.   REHAB POTENTIAL: Excellent  CLINICAL DECISION MAKING: Evolving/moderate complexity  EVALUATION COMPLEXITY: Low   GOALS: Goals reviewed with patient? Yes  SHORT TERM GOALS: Target date: 12/20/2023   Patient will be independent with initial HEP.  Baseline:  Goal status: In Progress  2.  Decreased neck pain by 25% or more with ADLs  Baseline: 6/10 Goal status: INITIAL  3.  Decreased frequency and intensity of HA by 25% to improved QOL Baseline: daily Goal status: INITIAL   LONG TERM GOALS: Target date: 01/17/2024   Patient will be  independent with advanced/ongoing HEP to improve outcomes and carryover.  Baseline: initial HEP Goal status: INITIAL  2.  Patient will report 75% improvement in neck pain to improve QOL.  Baseline: 6/10 Goal status: INITIAL  3.  Patient will demonstrate full pain free cervical ROM for safety with driving.  Baseline: pain in all motions but extension Goal status:INITIAL  4.  Patient will demonstrate improved mid and low trap strength to 4+/5 or better to improve posture and strain on neck.  Baseline: see chart Goal status:INITIAL  5.  Patient will report 21 or less on NDI to demonstrate improved functional ability.  Baseline: 26 Goal status: INITIAL    PLAN:  PT FREQUENCY: 1-2x/week  PT DURATION: 8 weeks  PLANNED INTERVENTIONS: 97164- PT Re-evaluation, 97110-Therapeutic exercises, 97530- Therapeutic activity, 97112- Neuromuscular re-education, 97535- Self Care, 02859- Manual therapy, G0283- Electrical stimulation (unattended), [phone]- Traction (mechanical), [id number]- Ionotophoresis 4mg /ml Dexamethasone , 79439 (1-2 muscles), 20561 (3+ muscles)- Dry Needling, Patient/Family education, Taping, Joint mobilization, Spinal mobilization, Cryotherapy, and Moist heat  PLAN FOR NEXT SESSION: Discussed DN but she would like to use this as last resort due to cost. Scapular stabilization, possible cervical isometrics, review and progress HEP, manual/DN to neck/UT, spinal stability  Delon B. Ivah Girardot, PT 11/30/23 12:39 PM [facility] [address], [address] [place], [place] [address] Phone # [phone] Fax [phone]

## 2023-12-02 ENCOUNTER — Ambulatory Visit

## 2023-12-02 ENCOUNTER — Telehealth: Payer: Self-pay

## 2023-12-02 ENCOUNTER — Ambulatory Visit: Admitting: Physical Therapy

## 2023-12-02 NOTE — Telephone Encounter (Signed)
 Patient answered.  Explained to patient that she had an appointment today at 4:15 pm.  She thought her appointment was tomorrow at 4:15.  Explained that we close on Fridays at 12 so she probably just wrote it down wrong.  She apologized and confirms that she will be here for her next appointment on 12/09/23 at 9:30.

## 2023-12-08 NOTE — Therapy (Signed)
 OUTPATIENT PHYSICAL THERAPY CERVICAL TREATMENT   Patient Name: [name] [name] MRN: [id number] DOB:Aug 06, 1990, 33 y.o., female Today's Date: 12/09/2023  END OF SESSION:  PT End of Session - 12/09/23 0951     Visit Number 4    Date for PT Re-Evaluation 01/17/24    Authorization Type [employer]    Authorization Time Period 8/18 to 10/16    Authorization - Visit Number 3    Authorization - Number of Visits 7    PT Start Time 0933    PT Stop Time 1013    PT Time Calculation (min) 40 min    Activity Tolerance Patient tolerated treatment well    Behavior During Therapy WFL for tasks assessed/performed            Past Medical History:  Diagnosis Date   Anxiety    Asthma    Depression    EDS (Ehlers-Danlos syndrome)    Hyperlipidemia    Migraine    Ovarian cyst    POTS (postural orthostatic tachycardia syndrome)    POTS (postural orthostatic tachycardia syndrome)    Preeclampsia, severe    Transaminitis    Vitamin B12 deficiency    Vitamin D deficiency    Past Surgical History:  Procedure Laterality Date   CESAREAN SECTION     CESAREAN SECTION WITH BILATERAL TUBAL LIGATION Bilateral 02/24/2023   Procedure: REPEAT CESAREAN SECTION WITH BILATERAL TUBAL LIGATION BY SALPINGECTOMY;  Surgeon: Gloriann Chick, MD;  Location: [facility];  Service: Obstetrics;  Laterality: Bilateral;   EYE SURGERY     x2   WISDOM TOOTH EXTRACTION     Patient Active Problem List   Diagnosis Date Noted   S/P cesarean section 02/28/2023   Acute blood loss anemia 02/28/2023   H/O anxiety disorder 02/28/2023   H/O: depression 02/28/2023   Chronic hypertension with superimposed preeclampsia 02/24/2023   POTS (postural orthostatic tachycardia syndrome) 01/28/2023   Obesity affecting pregnancy, antepartum 01/28/2023   Pregnancy with history of cesarean section, antepartum 01/28/2023   History of pre-eclampsia in prior pregnancy, currently pregnant 01/28/2023   Primary hypertension 06/07/2020    NASH (nonalcoholic steatohepatitis) 06/26/2019   Combined hyperlipidemia 06/26/2019    PCP: Verena Mems, MD   REFERRING PROVIDER: Chrystal Lamarr RAMAN, *   REFERRING DIAG:  H55.790 (ICD-10-CM) - Tension-type headache, unspecified, not intractable  M54.2 (ICD-10-CM) - Cervicalgia    THERAPY DIAG:  Cervicalgia  Muscle weakness (generalized)  Cramp and spasm  Rationale for Evaluation and Treatment: Rehabilitation  ONSET DATE: May 2025  SUBJECTIVE:  SUBJECTIVE STATEMENT: Sore after last visit and then got a migraine going to the beach. I do my exercises every night. Today my neck is tight but pain is mild.   Eval: Had baby at the end of November. My pelvic floor is weak and I'm getting low back pain as well. When my low back is hurting my head feels heavy and my neck feels stuck. Sometimes I do exercises the bands and weights. I've never had the sharp pains and not being able to move my neck before. This started 2-3 months ago. Low back pain is new since this latest pregnancy. I still do my neck stretches and exercises from last episode. Driving limited to 89-84 min. Migraines usually are R sided, but her last one was L sided. Hand dominance: Right  PERTINENT HISTORY:  2 c-sections (5 month old and 34 year old), POTS (hyperadrenic), peripheral neuropathy  PAIN:  Are you having pain? Yes: NPRS scale: 3 in morning up to 5-6/10 at end of day. Worse with migraines  Pain location: B cspine Pain description: heavy and stuck Aggravating factors: unsure Relieving factors: stretching, exercises, heat/ice, picking up kids aggravates R side  PRECAUTIONS: Other: POTS - h/o of passing out  RED FLAGS: None     WEIGHT BEARING RESTRICTIONS: No  FALLS:  Has patient fallen in last 6  months? No  LIVING ENVIRONMENT: Lives with: lives with their family   OCCUPATION: stay at home mom  PLOF: Independent  PATIENT GOALS:   NEXT MD VISIT: none scheduled  OBJECTIVE:  Note: Objective measures were completed at Evaluation unless otherwise noted.  DIAGNOSTIC FINDINGS:  none  PATIENT SURVEYS:  NDI:  NECK DISABILITY INDEX  Date: 11/22/23 Score  Pain intensity 2 = The pain is moderate at the moment  2. Personal care (washing, dressing, etc.) 2 = It is painful to look after myself and I am slow and careful  3. Lifting 1 =  I can lift heavy weights but it gives extra pain  4. Reading 2 =  I can read as much as I want with moderate pain in my neck  5. Headaches 5 = I have headaches almost all the time  6. Concentration 2 = I have a fair degree of difficulty in concentrating when I want to  7. Work 3 =  I cannot do my usual work  8. Driving 3 = I can't drive my car as long as I want because of moderate pain in my neck  9. Sleeping 3 =  My sleep is moderately disturbed (2-3 hrs sleepless)  10. Recreation 3 = I am able to engage in a few of my usual recreation activities because of pain in   my neck  Total 26/50   Minimum Detectable Change (90% confidence): 5 points or 10% points  COGNITION: Overall cognitive status: Within functional limits for tasks assessed  SENSATION: WFL some N/t in the back of her legs.   POSTURE: No Significant postural limitations  PALPATION: L UT, R paraspinals, L suboccipitals    CERVICAL ROM: P! = pain  Active ROM A/PROM (deg) eval 12/09/23  Flexion Full P! full  Extension full full  Right lateral flexion WNL P! full  Left lateral flexion WNL P! Full P!  Right rotation WNL P! full  Left rotation 75% P! full   (Blank rows = not tested)  UPPER EXTREMITY ROM: WNL  UPPER EXTREMITY MMT:  MMT Right eval Left eval  Shoulder flexion 4+ 4+P!  Shoulder extension  Shoulder abduction 5 5  Shoulder adduction    Shoulder extension     Shoulder internal rotation    Shoulder external rotation    Middle trapezius 5 4  Lower trapezius 4- 3+   (Blank rows = not tested)  Rhomboids 4-4+/5   TREATMENT DATE:                                                                                                                               12/09/23 Nustep x 6 min for UE strength  3 way scapular stabilization with blue loop x 10 each side 4D ball rolls with 2 lb plyo ball x 20 each direction + circles both UE Resisted neck retraction red band hands on wall 5 sec hold x 10 Standing horizontal ABD red x 2 Prone wing taps 2 x 10  Prone overhead reach and pull down 2 x 10 ROM assessed Prone on elbows diagonals x 10 each side Manual: STM to B UT, Levator, cervical paraspinals and suboccipitals. Sub occipital release.   11/30/23 UBE x 6 min 3 min fwd/3bwd 3 way scapular stabilization with blue loop x 10 each side 4D ball rolls with 2 lb plyo ball x 20 each direction both UE Prone wing taps 2 x 10  Prone overhead reach and pull down 2 x 10 Prone on elbows chin tuck 5 sec hold x 10 Prone on elbows diagonals x 10 each side Melt with roller flex/ext and rotation x 15 ea Manual: STM to B UT, Levator, cervical paraspinals and suboccipitals. Sub occipital release.  11/26/23 UBE x 6 min 3 min fwd/3bwd Melt with roller flex/ext and rotation x 15 ea Prone on elbows chin tuck 5 sec hold x 10 Prone on elbows diagonals x 10 B e Prone over small plyo ball: T and Y x 10 ea, Y to W to Y x 10 Isometrics: reviewed HEP  Manual: STM to B UT, Levator, cervical paraspinals and suboccipitals. OA release.    11/22/23 See pt ed and HEP  Seated isometrics ext, lateral SB and rotation MFR using small peanut on subocciptals     PATIENT EDUCATION:  Education details: PT eval findings, anticipated POC, initial HEP, and self-STM techniques to suboccipitals using small peanut ball   Person educated: Patient Education method: Explanation,  Demonstration, Tactile cues, Verbal cues, and Handouts Education comprehension: verbalized understanding and returned demonstration  HOME EXERCISE PROGRAM: Access Code: [email or web address] URL: [email or web address] Date: 12/09/2023 Prepared by: Mliss  Exercises - Standing Isometric Cervical Sidebending with Manual Resistance  - 1 x daily - 7 x weekly - 1 sets - 5 reps - 10 sec hold hold - Standing Isometric Cervical Flexion with Manual Resistance  - 1 x daily - 7 x weekly - 1 sets - 5 reps - 10 sec hold - Standing Isometric Cervical Extension with Manual Resistance  - 1 x daily - 7 x weekly - 1 sets - 5 reps - 10 sec  hold - Seated Isometric Cervical Rotation  - 1 x daily - 7 x weekly - 1 sets - 5 reps - 10 sec hold - Cervical Prone on Elbows Diagonals  - 2 x daily - 7 x weekly - 1-2 sets - 10 reps - Cervical Retraction with Resistance  - 1 x daily - 7 x weekly - 2 sets - 10 reps - 5 sec hold  MFR using small peanut on subocciptals  ASSESSMENT:  CLINICAL IMPRESSION:  Patient demonstrates full ROM and is pain free in all directions except lateral flexion. This improves after cervical diagonals and patient reports she does these frequently. She reports 40% improvement overall with neck pain, but only 5% improvement in headaches. HEP updated with resisted cervical retraction as patient reported relief with this as well. She is progressing appropriately and continues to demonstrate potential for improvement . She would benefit from continued skilled therapy to address impairments.    OBJECTIVE IMPAIRMENTS: decreased activity tolerance, decreased ROM, decreased strength, increased muscle spasms, impaired flexibility, and pain.   ACTIVITY LIMITATIONS: carrying, lifting, sleeping, bathing, dressing, hygiene/grooming, and caring for others  PARTICIPATION LIMITATIONS: meal prep, cleaning, laundry, driving, shopping, and yard work  PERSONAL FACTORS: Fitness, Past/current experiences, Time  since onset of injury/illness/exacerbation, and 1-2 comorbidities: POTS and EDS are also affecting patient's functional outcome.   REHAB POTENTIAL: Excellent  CLINICAL DECISION MAKING: Evolving/moderate complexity  EVALUATION COMPLEXITY: Low   GOALS: Goals reviewed with patient? Yes  SHORT TERM GOALS: Target date: 12/20/2023   Patient will be independent with initial HEP.  Baseline:  Goal status: MET  2.  Decreased neck pain by 25% or more with ADLs  Baseline: 6/10 Goal status: MET 40% 12/09/23   3.  Decreased frequency and intensity of HA by 25% to improved QOL Baseline: daily Goal status: IN PROGRESS 5% 12/09/23   LONG TERM GOALS: Target date: 01/17/2024   Patient will be independent with advanced/ongoing HEP to improve outcomes and carryover.  Baseline: initial HEP Goal status: INITIAL  2.  Patient will report 75% improvement in neck pain to improve QOL.  Baseline: 6/10 Goal status: INITIAL  3.  Patient will demonstrate full pain free cervical ROM for safety with driving.  Baseline: pain in all motions but extension Goal status:INITIAL  4.  Patient will demonstrate improved mid and low trap strength to 4+/5 or better to improve posture and strain on neck.  Baseline: see chart Goal status:INITIAL  5.  Patient will report 21 or less on NDI to demonstrate improved functional ability.  Baseline: 26 Goal status: INITIAL    PLAN:  PT FREQUENCY: 1-2x/week  PT DURATION: 8 weeks  PLANNED INTERVENTIONS: 97164- PT Re-evaluation, 97110-Therapeutic exercises, 97530- Therapeutic activity, 97112- Neuromuscular re-education, 97535- Self Care, 02859- Manual therapy, G0283- Electrical stimulation (unattended), [phone]- Traction (mechanical), [id number]- Ionotophoresis 4mg /ml Dexamethasone , 79439 (1-2 muscles), 20561 (3+ muscles)- Dry Needling, Patient/Family education, Taping, Joint mobilization, Spinal mobilization, Cryotherapy, and Moist heat  PLAN FOR NEXT SESSION: Discussed DN but  she would like to use this as last resort due to cost. Scapular stabilization, manual/DN to neck/UT, spinal stability  Mliss Cummins, PT  12/09/23 10:24 AM [facility] [address], [address] [place], [place] [address] Phone # [phone] Fax [phone]

## 2023-12-09 ENCOUNTER — Encounter: Payer: Self-pay | Admitting: Physical Therapy

## 2023-12-09 ENCOUNTER — Ambulatory Visit: Attending: Family Medicine | Admitting: Physical Therapy

## 2023-12-09 DIAGNOSIS — M357 Hypermobility syndrome: Secondary | ICD-10-CM | POA: Diagnosis present

## 2023-12-09 DIAGNOSIS — M6281 Muscle weakness (generalized): Secondary | ICD-10-CM | POA: Diagnosis present

## 2023-12-09 DIAGNOSIS — R293 Abnormal posture: Secondary | ICD-10-CM | POA: Insufficient documentation

## 2023-12-09 DIAGNOSIS — G8929 Other chronic pain: Secondary | ICD-10-CM | POA: Diagnosis present

## 2023-12-09 DIAGNOSIS — R252 Cramp and spasm: Secondary | ICD-10-CM | POA: Insufficient documentation

## 2023-12-09 DIAGNOSIS — R519 Headache, unspecified: Secondary | ICD-10-CM | POA: Insufficient documentation

## 2023-12-09 DIAGNOSIS — M542 Cervicalgia: Secondary | ICD-10-CM | POA: Diagnosis present

## 2023-12-14 ENCOUNTER — Ambulatory Visit

## 2023-12-14 ENCOUNTER — Telehealth: Payer: Self-pay | Admitting: Internal Medicine

## 2023-12-14 DIAGNOSIS — R293 Abnormal posture: Secondary | ICD-10-CM

## 2023-12-14 DIAGNOSIS — M542 Cervicalgia: Secondary | ICD-10-CM | POA: Diagnosis not present

## 2023-12-14 DIAGNOSIS — R519 Headache, unspecified: Secondary | ICD-10-CM

## 2023-12-14 DIAGNOSIS — R252 Cramp and spasm: Secondary | ICD-10-CM

## 2023-12-14 DIAGNOSIS — M6281 Muscle weakness (generalized): Secondary | ICD-10-CM

## 2023-12-14 NOTE — Telephone Encounter (Signed)
 Patient will try to find someone who treat shock syndrome and give us  a call back for a referral if needed.

## 2023-12-14 NOTE — Therapy (Signed)
 OUTPATIENT PHYSICAL THERAPY CERVICAL TREATMENT   Patient Name: [name] [name] MRN: [id number] DOB:[birth year], 33 y.o., female Today's Date: 12/14/2023  END OF SESSION:  PT End of Session - 12/14/23 0942     Visit Number 5    Date for PT Re-Evaluation 01/17/24    Authorization Type [employer]    Authorization Time Period 8/18 to 10/16    Authorization - Visit Number 5    Authorization - Number of Visits 7    Progress Note Due on Visit 10    PT Start Time 0933    PT Stop Time 1011    PT Time Calculation (min) 38 min    Activity Tolerance Patient tolerated treatment well    Behavior During Therapy WFL for tasks assessed/performed            Past Medical History:  Diagnosis Date   Anxiety    Asthma    Depression    EDS (Ehlers-Danlos syndrome)    Hyperlipidemia    Migraine    Ovarian cyst    POTS (postural orthostatic tachycardia syndrome)    POTS (postural orthostatic tachycardia syndrome)    Preeclampsia, severe    Transaminitis    Vitamin B12 deficiency    Vitamin D deficiency    Past Surgical History:  Procedure Laterality Date   CESAREAN SECTION     CESAREAN SECTION WITH BILATERAL TUBAL LIGATION Bilateral 02/24/2023   Procedure: REPEAT CESAREAN SECTION WITH BILATERAL TUBAL LIGATION BY SALPINGECTOMY;  Surgeon: Gloriann Chick, MD;  Location: [facility];  Service: Obstetrics;  Laterality: Bilateral;   EYE SURGERY     x2   WISDOM TOOTH EXTRACTION     Patient Active Problem List   Diagnosis Date Noted   S/P cesarean section 02/28/2023   Acute blood loss anemia 02/28/2023   H/O anxiety disorder 02/28/2023   H/O: depression 02/28/2023   Chronic hypertension with superimposed preeclampsia 02/24/2023   POTS (postural orthostatic tachycardia syndrome) 01/28/2023   Obesity affecting pregnancy, antepartum 01/28/2023   Pregnancy with history of cesarean section, antepartum 01/28/2023   History of pre-eclampsia in prior pregnancy, currently pregnant 01/28/2023    Primary hypertension 06/07/2020   NASH (nonalcoholic steatohepatitis) 06/26/2019   Combined hyperlipidemia 06/26/2019    PCP: Verena Mems, MD   REFERRING PROVIDER: Chrystal Lamarr RAMAN, *   REFERRING DIAG:  H55.790 (ICD-10-CM) - Tension-type headache, unspecified, not intractable  M54.2 (ICD-10-CM) - Cervicalgia    THERAPY DIAG:  Cervicalgia  Muscle weakness (generalized)  Cramp and spasm  Abnormal posture  Chronic nonintractable headache, unspecified headache type  Rationale for Evaluation and Treatment: Rehabilitation  ONSET DATE: May 2025  SUBJECTIVE:  SUBJECTIVE STATEMENT: Pain 2/10 neck and shoulders, headache 4/10.   Eval: Had baby at the end of November. My pelvic floor is weak and I'm getting low back pain as well. When my low back is hurting my head feels heavy and my neck feels stuck. Sometimes I do exercises the bands and weights. I've never had the sharp pains and not being able to move my neck before. This started 2-3 months ago. Low back pain is new since this latest pregnancy. I still do my neck stretches and exercises from last episode. Driving limited to 89-84 min. Migraines usually are R sided, but her last one was L sided. Hand dominance: Right  PERTINENT HISTORY:  2 c-sections (41 month old and 34 year old), POTS (hyperadrenic), peripheral neuropathy  PAIN:  Are you having pain? Yes: NPRS scale: 3 in morning up to 5-6/10 at end of day. Worse with migraines  Pain location: B cspine Pain description: heavy and stuck Aggravating factors: unsure Relieving factors: stretching, exercises, heat/ice, picking up kids aggravates R side  PRECAUTIONS: Other: POTS - h/o of passing out  RED FLAGS: None     WEIGHT BEARING RESTRICTIONS: No  FALLS:  Has  patient fallen in last 6 months? No  LIVING ENVIRONMENT: Lives with: lives with their family   OCCUPATION: stay at home mom  PLOF: Independent  PATIENT GOALS:   NEXT MD VISIT: none scheduled  OBJECTIVE:  Note: Objective measures were completed at Evaluation unless otherwise noted.  DIAGNOSTIC FINDINGS:  none  PATIENT SURVEYS:  NDI:  NECK DISABILITY INDEX  Date: 11/22/23 Score  Pain intensity 2 = The pain is moderate at the moment  2. Personal care (washing, dressing, etc.) 2 = It is painful to look after myself and I am slow and careful  3. Lifting 1 =  I can lift heavy weights but it gives extra pain  4. Reading 2 =  I can read as much as I want with moderate pain in my neck  5. Headaches 5 = I have headaches almost all the time  6. Concentration 2 = I have a fair degree of difficulty in concentrating when I want to  7. Work 3 =  I cannot do my usual work  8. Driving 3 = I can't drive my car as long as I want because of moderate pain in my neck  9. Sleeping 3 =  My sleep is moderately disturbed (2-3 hrs sleepless)  10. Recreation 3 = I am able to engage in a few of my usual recreation activities because of pain in   my neck  Total 26/50   Minimum Detectable Change (90% confidence): 5 points or 10% points  COGNITION: Overall cognitive status: Within functional limits for tasks assessed  SENSATION: WFL some N/t in the back of her legs.   POSTURE: No Significant postural limitations  PALPATION: L UT, R paraspinals, L suboccipitals    CERVICAL ROM: P! = pain  Active ROM A/PROM (deg) eval 12/09/23  Flexion Full P! full  Extension full full  Right lateral flexion WNL P! full  Left lateral flexion WNL P! Full P!  Right rotation WNL P! full  Left rotation 75% P! full   (Blank rows = not tested)  UPPER EXTREMITY ROM: WNL  UPPER EXTREMITY MMT:  MMT Right eval Left eval  Shoulder flexion 4+ 4+P!  Shoulder extension    Shoulder abduction 5 5  Shoulder  adduction    Shoulder extension    Shoulder internal  rotation    Shoulder external rotation    Middle trapezius 5 4  Lower trapezius 4- 3+   (Blank rows = not tested)  Rhomboids 4-4+/5   TREATMENT DATE:                                                                                                                               12/14/23 UBE x 6 min for UE strength  3 way scapular stabilization with blue loop x 10 each side 4D ball rolls with 2 lb plyo ball x 20 each direction + circles both UE Resisted neck retraction red band hands on wall 5 sec hold x 10 Standing horizontal ABD red x 20 Standing bilateral shoulder ER with red tband x 20 Prone wing taps 2 x 10  Prone overhead reach and pull down 2 x 10 MELT technique: rotation x 20, flex/ext x 20, figure 8's 2 x 10 Prone on elbows diagonals x 10 each side Manual: STM to B UT, Levator, cervical paraspinals and suboccipitals. Sub occipital release.   12/09/23 Nustep x 6 min for UE strength  3 way scapular stabilization with blue loop x 10 each side 4D ball rolls with 2 lb plyo ball x 20 each direction + circles both UE Resisted neck retraction red band hands on wall 5 sec hold x 10 Standing horizontal ABD red x 2 Prone wing taps 2 x 10  Prone overhead reach and pull down 2 x 10 ROM assessed Prone on elbows diagonals x 10 each side Manual: STM to B UT, Levator, cervical paraspinals and suboccipitals. Sub occipital release.   11/30/23 UBE x 6 min 3 min fwd/3bwd 3 way scapular stabilization with blue loop x 10 each side 4D ball rolls with 2 lb plyo ball x 20 each direction both UE Prone wing taps 2 x 10  Prone overhead reach and pull down 2 x 10 Prone on elbows chin tuck 5 sec hold x 10 Prone on elbows diagonals x 10 each side Melt with roller flex/ext and rotation x 15 ea Manual: STM to B UT, Levator, cervical paraspinals and suboccipitals. Sub occipital release.   PATIENT EDUCATION:  Education details: PT eval findings,  anticipated POC, initial HEP, and self-STM techniques to suboccipitals using small peanut ball   Person educated: Patient Education method: Explanation, Demonstration, Tactile cues, Verbal cues, and Handouts Education comprehension: verbalized understanding and returned demonstration  HOME EXERCISE PROGRAM: Access Code: [email or web address] URL: [email or web address] Date: 12/09/2023 Prepared by: Mliss  Exercises - Standing Isometric Cervical Sidebending with Manual Resistance  - 1 x daily - 7 x weekly - 1 sets - 5 reps - 10 sec hold hold - Standing Isometric Cervical Flexion with Manual Resistance  - 1 x daily - 7 x weekly - 1 sets - 5 reps - 10 sec hold - Standing Isometric Cervical Extension with Manual Resistance  - 1 x daily - 7 x weekly - 1 sets - 5 reps -  10 sec hold - Seated Isometric Cervical Rotation  - 1 x daily - 7 x weekly - 1 sets - 5 reps - 10 sec hold - Cervical Prone on Elbows Diagonals  - 2 x daily - 7 x weekly - 1-2 sets - 10 reps - Cervical Retraction with Resistance  - 1 x daily - 7 x weekly - 2 sets - 10 reps - 5 sec hold  MFR using small peanut on subocciptals  ASSESSMENT:  CLINICAL IMPRESSION:  Patient is responding well to current POC.  She is hypermobile and benefits from stability.  She does have some soft tissue restriction and sub occipital restriction.  She is fairly compliant with her HEP.  She would benefit from continued skilled therapy to address impairments.    OBJECTIVE IMPAIRMENTS: decreased activity tolerance, decreased ROM, decreased strength, increased muscle spasms, impaired flexibility, and pain.   ACTIVITY LIMITATIONS: carrying, lifting, sleeping, bathing, dressing, hygiene/grooming, and caring for others  PARTICIPATION LIMITATIONS: meal prep, cleaning, laundry, driving, shopping, and yard work  PERSONAL FACTORS: Fitness, Past/current experiences, Time since onset of injury/illness/exacerbation, and 1-2 comorbidities: POTS and EDS are also  affecting patient's functional outcome.   REHAB POTENTIAL: Excellent  CLINICAL DECISION MAKING: Evolving/moderate complexity  EVALUATION COMPLEXITY: Low   GOALS: Goals reviewed with patient? Yes  SHORT TERM GOALS: Target date: 12/20/2023   Patient will be independent with initial HEP.  Baseline:  Goal status: MET  2.  Decreased neck pain by 25% or more with ADLs  Baseline: 6/10 Goal status: MET 40% 12/09/23   3.  Decreased frequency and intensity of HA by 25% to improved QOL Baseline: daily Goal status: IN PROGRESS 5% 12/09/23   LONG TERM GOALS: Target date: 01/17/2024   Patient will be independent with advanced/ongoing HEP to improve outcomes and carryover.  Baseline: initial HEP Goal status: INITIAL  2.  Patient will report 75% improvement in neck pain to improve QOL.  Baseline: 6/10 Goal status: INITIAL  3.  Patient will demonstrate full pain free cervical ROM for safety with driving.  Baseline: pain in all motions but extension Goal status:INITIAL  4.  Patient will demonstrate improved mid and low trap strength to 4+/5 or better to improve posture and strain on neck.  Baseline: see chart Goal status:INITIAL  5.  Patient will report 21 or less on NDI to demonstrate improved functional ability.  Baseline: 26 Goal status: INITIAL    PLAN:  PT FREQUENCY: 1-2x/week  PT DURATION: 8 weeks  PLANNED INTERVENTIONS: 97164- PT Re-evaluation, 97110-Therapeutic exercises, 97530- Therapeutic activity, 97112- Neuromuscular re-education, 97535- Self Care, 02859- Manual therapy, G0283- Electrical stimulation (unattended), [id number]- Traction (mechanical), [id number]- Ionotophoresis 4mg /ml Dexamethasone , 79439 (1-2 muscles), 20561 (3+ muscles)- Dry Needling, Patient/Family education, Taping, Joint mobilization, Spinal mobilization, Cryotherapy, and Moist heat  PLAN FOR NEXT SESSION: UBE, MELT, Postural strengthening, DN as last resort due to cost. Scapular stabilization, manual/DN to  neck/UT, spinal stability  Delon B. Yarely Bebee, PT 12/14/23 12:13 PM [facility] [address] [place], [place] [address] Phone # [phone] Fax [phone]

## 2023-12-14 NOTE — Telephone Encounter (Signed)
 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

## 2023-12-16 ENCOUNTER — Ambulatory Visit

## 2023-12-16 DIAGNOSIS — M542 Cervicalgia: Secondary | ICD-10-CM | POA: Diagnosis not present

## 2023-12-16 DIAGNOSIS — M6281 Muscle weakness (generalized): Secondary | ICD-10-CM

## 2023-12-16 DIAGNOSIS — M357 Hypermobility syndrome: Secondary | ICD-10-CM

## 2023-12-16 DIAGNOSIS — R252 Cramp and spasm: Secondary | ICD-10-CM

## 2023-12-16 DIAGNOSIS — G8929 Other chronic pain: Secondary | ICD-10-CM

## 2023-12-16 DIAGNOSIS — R293 Abnormal posture: Secondary | ICD-10-CM

## 2023-12-16 NOTE — Therapy (Signed)
 OUTPATIENT PHYSICAL THERAPY CERVICAL TREATMENT   Patient Name: [name] [name] MRN: [id number] DOB:29-May-1990, 33 y.o., female Today's Date: 12/16/2023  END OF SESSION:  PT End of Session - 12/16/23 0951     Visit Number 6    Date for PT Re-Evaluation 01/17/24    Authorization Type [employer]    Authorization Time Period 8/18 to 10/16    Authorization - Visit Number 6    Authorization - Number of Visits 7    Progress Note Due on Visit 10    PT Start Time 0932    PT Stop Time 1015    PT Time Calculation (min) 43 min    Activity Tolerance Patient tolerated treatment well    Behavior During Therapy WFL for tasks assessed/performed            Past Medical History:  Diagnosis Date   Anxiety    Asthma    Depression    EDS (Ehlers-Danlos syndrome)    Hyperlipidemia    Migraine    Ovarian cyst    POTS (postural orthostatic tachycardia syndrome)    POTS (postural orthostatic tachycardia syndrome)    Preeclampsia, severe    Transaminitis    Vitamin B12 deficiency    Vitamin D deficiency    Past Surgical History:  Procedure Laterality Date   CESAREAN SECTION     CESAREAN SECTION WITH BILATERAL TUBAL LIGATION Bilateral 02/24/2023   Procedure: REPEAT CESAREAN SECTION WITH BILATERAL TUBAL LIGATION BY SALPINGECTOMY;  Surgeon: Gloriann Chick, MD;  Location: [facility];  Service: Obstetrics;  Laterality: Bilateral;   EYE SURGERY     x2   WISDOM TOOTH EXTRACTION     Patient Active Problem List   Diagnosis Date Noted   S/P cesarean section 02/28/2023   Acute blood loss anemia 02/28/2023   H/O anxiety disorder 02/28/2023   H/O: depression 02/28/2023   Chronic hypertension with superimposed preeclampsia 02/24/2023   POTS (postural orthostatic tachycardia syndrome) 01/28/2023   Obesity affecting pregnancy, antepartum 01/28/2023   Pregnancy with history of cesarean section, antepartum 01/28/2023   History of pre-eclampsia in prior pregnancy, currently pregnant 01/28/2023    Primary hypertension 06/07/2020   NASH (nonalcoholic steatohepatitis) 06/26/2019   Combined hyperlipidemia 06/26/2019    PCP: Verena Mems, MD   REFERRING PROVIDER: Chrystal Lamarr RAMAN, *   REFERRING DIAG:  H55.790 (ICD-10-CM) - Tension-type headache, unspecified, not intractable  M54.2 (ICD-10-CM) - Cervicalgia    THERAPY DIAG:  Cervicalgia  Muscle weakness (generalized)  Hypermobility syndrome  Cramp and spasm  Abnormal posture  Chronic nonintractable headache, unspecified headache type  Rationale for Evaluation and Treatment: Rehabilitation  ONSET DATE: May 2025  SUBJECTIVE:  SUBJECTIVE STATEMENT: Pain 4/10 neck and shoulders, headache 3/10. My left upper trap is really achy today.    Eval: Had baby at the end of November. My pelvic floor is weak and I'm getting low back pain as well. When my low back is hurting my head feels heavy and my neck feels stuck. Sometimes I do exercises the bands and weights. I've never had the sharp pains and not being able to move my neck before. This started 2-3 months ago. Low back pain is new since this latest pregnancy. I still do my neck stretches and exercises from last episode. Driving limited to 89-84 min. Migraines usually are R sided, but her last one was L sided. Hand dominance: Right  PERTINENT HISTORY:  2 c-sections (73 month old and 34 year old), POTS (hyperadrenic), peripheral neuropathy  PAIN:  Are you having pain? Yes: NPRS scale: 3 in morning up to 5-6/10 at end of day. Worse with migraines  Pain location: B cspine Pain description: heavy and stuck Aggravating factors: unsure Relieving factors: stretching, exercises, heat/ice, picking up kids aggravates R side  PRECAUTIONS: Other: POTS - h/o of passing out  RED  FLAGS: None     WEIGHT BEARING RESTRICTIONS: No  FALLS:  Has patient fallen in last 6 months? No  LIVING ENVIRONMENT: Lives with: lives with their family   OCCUPATION: stay at home mom  PLOF: Independent  PATIENT GOALS:   NEXT MD VISIT: none scheduled  OBJECTIVE:  Note: Objective measures were completed at Evaluation unless otherwise noted.  DIAGNOSTIC FINDINGS:  none  PATIENT SURVEYS:  NDI:  NECK DISABILITY INDEX   Date: 11/22/23 Score 12/16/23  Pain intensity 2 = The pain is moderate at the moment   2. Personal care (washing, dressing, etc.) 2 = It is painful to look after myself and I am slow and careful  Completed on Ipad  3. Lifting 1 =  I can lift heavy weights but it gives extra pain   4. Reading 2 =  I can read as much as I want with moderate pain in my neck   5. Headaches 5 = I have headaches almost all the time   6. Concentration 2 = I have a fair degree of difficulty in concentrating when I want to   7. Work 3 =  I cannot do my usual work   8. Driving 3 = I can't drive my car as long as I want because of moderate pain in my neck   9. Sleeping 3 =  My sleep is moderately disturbed (2-3 hrs sleepless)   10. Recreation 3 = I am able to engage in a few of my usual recreation activities because of pain in   my neck   Total 26/50 21/50   Minimum Detectable Change (90% confidence): 5 points or 10% points  COGNITION: Overall cognitive status: Within functional limits for tasks assessed  SENSATION: WFL some N/t in the back of her legs.   POSTURE: No Significant postural limitations  PALPATION: L UT, R paraspinals, L suboccipitals    CERVICAL ROM: P! = pain  Active ROM A/PROM (deg) eval 12/09/23 12/16/23  Flexion Full P! full full  Extension full full full  Right lateral flexion WNL P! full Full P!  Left lateral flexion WNL P! Full P! full  Right rotation WNL P! full Full P!  Left rotation 75% P! full full   (Blank rows = not tested)  UPPER EXTREMITY  ROM: WNL  UPPER EXTREMITY MMT:  MMT Right eval Left eval Right 12/16/23 Left 12/16/23  Shoulder flexion 4+ 4+P! 4+ 4+  Shoulder extension      Shoulder abduction 5 5 5 5  P!  Shoulder adduction      Shoulder extension      Shoulder internal rotation      Shoulder external rotation      Middle trapezius 5 4 5 4   Lower trapezius 4- 3+ 4 4   (Blank rows = not tested)  Rhomboids 4-4+/5   TREATMENT DATE:                                                                                                                               12/16/23 UBE x 6 min for UE strength  3 way scapular stabilization with blue loop x 10 each side 4D ball rolls with 2 lb plyo ball x 20 each direction + circles both UE Lat pull with 30 # 2 x 10 Wall push up plus  x 10 Re-assessment for insurance Prone wing taps 2 x 10 Prone reach and pull 2 x 10 Quadruped alternating arms x 20 Quadruped alternating arms and legs x 20 MELT: cervical on blue foam roller x 20, flex/ext x 20, figure 8's  12/14/23 UBE x 6 min for UE strength  3 way scapular stabilization with blue loop x 10 each side 4D ball rolls with 2 lb plyo ball x 20 each direction + circles both UE Resisted neck retraction red band hands on wall 5 sec hold x 10 Standing horizontal ABD red x 20 Standing bilateral shoulder ER with red tband x 20 Prone wing taps 2 x 10  Prone overhead reach and pull down 2 x 10 MELT technique: rotation x 20, flex/ext x 20, figure 8's 2 x 10 Prone on elbows diagonals x 10 each side Manual: STM to B UT, Levator, cervical paraspinals and suboccipitals. Sub occipital release.   12/09/23 Nustep x 6 min for UE strength  3 way scapular stabilization with blue loop x 10 each side 4D ball rolls with 2 lb plyo ball x 20 each direction + circles both UE Resisted neck retraction red band hands on wall 5 sec hold x 10 Standing horizontal ABD red x 2 Prone wing taps 2 x 10  Prone overhead reach and pull down 2 x 10 ROM  assessed Prone on elbows diagonals x 10 each side Manual: STM to B UT, Levator, cervical paraspinals and suboccipitals. Sub occipital release.   PATIENT EDUCATION:  Education details: PT eval findings, anticipated POC, initial HEP, and self-STM techniques to suboccipitals using small peanut ball   Person educated: Patient Education method: Explanation, Demonstration, Tactile cues, Verbal cues, and Handouts Education comprehension: verbalized understanding and returned demonstration  HOME EXERCISE PROGRAM: Access Code: [email or web address] URL: [email or web address] Date: 12/09/2023 Prepared by: Mliss  Exercises - Standing Isometric Cervical Sidebending with Manual Resistance  - 1 x daily - 7 x weekly - 1 sets -  5 reps - 10 sec hold hold - Standing Isometric Cervical Flexion with Manual Resistance  - 1 x daily - 7 x weekly - 1 sets - 5 reps - 10 sec hold - Standing Isometric Cervical Extension with Manual Resistance  - 1 x daily - 7 x weekly - 1 sets - 5 reps - 10 sec hold - Seated Isometric Cervical Rotation  - 1 x daily - 7 x weekly - 1 sets - 5 reps - 10 sec hold - Cervical Prone on Elbows Diagonals  - 2 x daily - 7 x weekly - 1-2 sets - 10 reps - Cervical Retraction with Resistance  - 1 x daily - 7 x weekly - 2 sets - 10 reps - 5 sec hold  MFR using small peanut on subocciptals  ASSESSMENT:  CLINICAL IMPRESSION:  Patient is making steady progress but has chronic headaches and upper body pain along with other chronic conditions like POTS.  Objective findings are  improved.   She would benefit from continued skilled therapy to address impairments.  We will request more visits from insurance.  Patient mentions she may want to finish up next visit but not sure and she has not met all goals so we will proceed with the auth request.    OBJECTIVE IMPAIRMENTS: decreased activity tolerance, decreased ROM, decreased strength, increased muscle spasms, impaired flexibility, and pain.    ACTIVITY LIMITATIONS: carrying, lifting, sleeping, bathing, dressing, hygiene/grooming, and caring for others  PARTICIPATION LIMITATIONS: meal prep, cleaning, laundry, driving, shopping, and yard work  PERSONAL FACTORS: Fitness, Past/current experiences, Time since onset of injury/illness/exacerbation, and 1-2 comorbidities: POTS and EDS are also affecting patient's functional outcome.   REHAB POTENTIAL: Excellent  CLINICAL DECISION MAKING: Evolving/moderate complexity  EVALUATION COMPLEXITY: Low   GOALS: Goals reviewed with patient? Yes  SHORT TERM GOALS: Target date: 12/20/2023   Patient will be independent with initial HEP.  Baseline:  Goal status: MET  2.  Decreased neck pain by 25% or more with ADLs  Baseline: 6/10 Goal status: MET 40% 12/09/23   3.  Decreased frequency and intensity of HA by 25% to improved QOL Baseline: daily Goal status: IN PROGRESS 5% 12/09/23   LONG TERM GOALS: Target date: 01/17/2024   Patient will be independent with advanced/ongoing HEP to improve outcomes and carryover.  Baseline: initial HEP Goal status: INITIAL  2.  Patient will report 75% improvement in neck pain to improve QOL.  Baseline: 6/10 Goal status: INITIAL  3.  Patient will demonstrate full pain free cervical ROM for safety with driving.  Baseline: pain in all motions but extension Goal status:INITIAL  4.  Patient will demonstrate improved mid and low trap strength to 4+/5 or better to improve posture and strain on neck.  Baseline: see chart Goal status:INITIAL  5.  Patient will report 21 or less on NDI to demonstrate improved functional ability.  Baseline: 26 Goal status: INITIAL    PLAN:  PT FREQUENCY: 1-2x/week  PT DURATION: 8 weeks  PLANNED INTERVENTIONS: 97164- PT Re-evaluation, 97110-Therapeutic exercises, 97530- Therapeutic activity, 97112- Neuromuscular re-education, 97535- Self Care, 02859- Manual therapy, G0283- Electrical stimulation (unattended), [phone]-  Traction (mechanical), [id number]- Ionotophoresis 4mg /ml Dexamethasone , 79439 (1-2 muscles), 20561 (3+ muscles)- Dry Needling, Patient/Family education, Taping, Joint mobilization, Spinal mobilization, Cryotherapy, and Moist heat  PLAN FOR NEXT SESSION: UBE, MELT, Postural strengthening, DN as last resort due to cost. Scapular stabilization, manual/DN to neck/UT, spinal stability  Delon B. Miyeko Mahlum, PT 12/16/23 10:39 AM [facility] [phone]  [address], [address] [place], [place] [address] Phone # [phone] Fax [phone]

## 2023-12-20 ENCOUNTER — Ambulatory Visit

## 2023-12-24 ENCOUNTER — Ambulatory Visit

## 2023-12-24 DIAGNOSIS — M542 Cervicalgia: Secondary | ICD-10-CM

## 2023-12-24 DIAGNOSIS — M6281 Muscle weakness (generalized): Secondary | ICD-10-CM

## 2023-12-24 DIAGNOSIS — M357 Hypermobility syndrome: Secondary | ICD-10-CM

## 2023-12-24 DIAGNOSIS — R252 Cramp and spasm: Secondary | ICD-10-CM

## 2023-12-24 DIAGNOSIS — R519 Headache, unspecified: Secondary | ICD-10-CM

## 2023-12-24 NOTE — Therapy (Signed)
 OUTPATIENT PHYSICAL THERAPY CERVICAL TREATMENT   Patient Name: [name] [name] MRN: [id number] DOB:[birth year], 33 y.o., female Today's Date: 12/24/2023  END OF SESSION:  PT End of Session - 12/24/23 1027     Visit Number 7    Number of Visits 11    Date for Recertification  01/17/24    Authorization Type [employer]    Authorization Time Period 8/18 to 10/16    Authorization - Visit Number 1    Authorization - Number of Visits 5    Progress Note Due on Visit 10    PT Start Time 1015    PT Stop Time 1058    PT Time Calculation (min) 43 min    Activity Tolerance Patient tolerated treatment well    Behavior During Therapy WFL for tasks assessed/performed            Past Medical History:  Diagnosis Date   Anxiety    Asthma    Depression    EDS (Ehlers-Danlos syndrome)    Hyperlipidemia    Migraine    Ovarian cyst    POTS (postural orthostatic tachycardia syndrome)    POTS (postural orthostatic tachycardia syndrome)    Preeclampsia, severe    Transaminitis    Vitamin B12 deficiency    Vitamin D deficiency    Past Surgical History:  Procedure Laterality Date   CESAREAN SECTION     CESAREAN SECTION WITH BILATERAL TUBAL LIGATION Bilateral 02/24/2023   Procedure: REPEAT CESAREAN SECTION WITH BILATERAL TUBAL LIGATION BY SALPINGECTOMY;  Surgeon: Gloriann Chick, MD;  Location: [facility];  Service: Obstetrics;  Laterality: Bilateral;   EYE SURGERY     x2   WISDOM TOOTH EXTRACTION     Patient Active Problem List   Diagnosis Date Noted   S/P cesarean section 02/28/2023   Acute blood loss anemia 02/28/2023   H/O anxiety disorder 02/28/2023   H/O: depression 02/28/2023   Chronic hypertension with superimposed preeclampsia 02/24/2023   POTS (postural orthostatic tachycardia syndrome) 01/28/2023   Obesity affecting pregnancy, antepartum 01/28/2023   Pregnancy with history of cesarean section, antepartum 01/28/2023   History of pre-eclampsia in prior pregnancy, currently  pregnant 01/28/2023   Primary hypertension 06/07/2020   NASH (nonalcoholic steatohepatitis) 06/26/2019   Combined hyperlipidemia 06/26/2019    PCP: Verena Mems, MD   REFERRING PROVIDER: Chrystal Lamarr RAMAN, *   REFERRING DIAG:  H55.790 (ICD-10-CM) - Tension-type headache, unspecified, not intractable  M54.2 (ICD-10-CM) - Cervicalgia    THERAPY DIAG:  Cervicalgia  Muscle weakness (generalized)  Hypermobility syndrome  Cramp and spasm  Chronic nonintractable headache, unspecified headache type  Rationale for Evaluation and Treatment: Rehabilitation  ONSET DATE: May 2025  SUBJECTIVE:  SUBJECTIVE STATEMENT: I feel ok      Eval: Had baby at the end of November. My pelvic floor is weak and I'm getting low back pain as well. When my low back is hurting my head feels heavy and my neck feels stuck. Sometimes I do exercises the bands and weights. I've never had the sharp pains and not being able to move my neck before. This started 2-3 months ago. Low back pain is new since this latest pregnancy. I still do my neck stretches and exercises from last episode. Driving limited to 89-84 min. Migraines usually are R sided, but her last one was L sided. Hand dominance: Right  PERTINENT HISTORY:  2 c-sections (49 month old and 34 year old), POTS (hyperadrenic), peripheral neuropathy  PAIN:  12/24/23 Are you having pain? Yes: NPRS scale: 3 in morning up to 5-6/10 at end of day. Worse when she has a migraine of course  Pain location: B cspine Pain description: heavy and stuck Aggravating factors: unsure Relieving factors: stretching, exercises, heat/ice, picking up kids aggravates R side  PRECAUTIONS: Other: POTS - h/o of passing out  RED FLAGS: None     WEIGHT BEARING  RESTRICTIONS: No  FALLS:  Has patient fallen in last 6 months? No  LIVING ENVIRONMENT: Lives with: lives with their family   OCCUPATION: stay at home mom  PLOF: Independent  PATIENT GOALS:   NEXT MD VISIT: none scheduled  OBJECTIVE:  Note: Objective measures were completed at Evaluation unless otherwise noted.  DIAGNOSTIC FINDINGS:  none  PATIENT SURVEYS:  NDI:  NECK DISABILITY INDEX   Date: 11/22/23 Score 12/16/23  Pain intensity 2 = The pain is moderate at the moment   2. Personal care (washing, dressing, etc.) 2 = It is painful to look after myself and I am slow and careful  Completed on Ipad  3. Lifting 1 =  I can lift heavy weights but it gives extra pain   4. Reading 2 =  I can read as much as I want with moderate pain in my neck   5. Headaches 5 = I have headaches almost all the time   6. Concentration 2 = I have a fair degree of difficulty in concentrating when I want to   7. Work 3 =  I cannot do my usual work   8. Driving 3 = I can't drive my car as long as I want because of moderate pain in my neck   9. Sleeping 3 =  My sleep is moderately disturbed (2-3 hrs sleepless)   10. Recreation 3 = I am able to engage in a few of my usual recreation activities because of pain in   my neck   Total 26/50 21/50   Minimum Detectable Change (90% confidence): 5 points or 10% points  COGNITION: Overall cognitive status: Within functional limits for tasks assessed  SENSATION: WFL some N/t in the back of her legs.   POSTURE: No Significant postural limitations  PALPATION: L UT, R paraspinals, L suboccipitals    CERVICAL ROM: P! = pain  Active ROM A/PROM (deg) eval 12/09/23 12/16/23  Flexion Full P! full full  Extension full full full  Right lateral flexion WNL P! full Full P!  Left lateral flexion WNL P! Full P! full  Right rotation WNL P! full Full P!  Left rotation 75% P! full full   (Blank rows = not tested)  UPPER EXTREMITY ROM: WNL  UPPER EXTREMITY  MMT:  MMT Right eval  Left eval Right 12/16/23 Left 12/16/23  Shoulder flexion 4+ 4+P! 4+ 4+  Shoulder extension      Shoulder abduction 5 5 5 5  P!  Shoulder adduction      Shoulder extension      Shoulder internal rotation      Shoulder external rotation      Middle trapezius 5 4 5 4   Lower trapezius 4- 3+ 4 4   (Blank rows = not tested)  Rhomboids 4-4+/5   TREATMENT DATE:                                                                                                                               12/24/23 UBE x 6 min for UE strength  3 way scapular stabilization with blue loop x 10 each side 4D ball rolls with 2 lb plyo ball x 20 each direction + circles both UE Prone wing taps 2 x 10 Prone reach and pull 2 x 10 MELT: cervical on blue foam roller x 20, flex/ext x 20, figure 8's Open book x 10 each side holding 10 seconds each  Quadruped thread the needle with thoracic rotation x 10 each side holding 2-3 seconds Reviewed isometrics in all planes of cervical ROM: had patient do 10 each holding 5 sec each Instructed in MELT ball option for cervical paraspinal STM  12/16/23 UBE x 6 min for UE strength  3 way scapular stabilization with blue loop x 10 each side 4D ball rolls with 2 lb plyo ball x 20 each direction + circles both UE Lat pull with 30 # 2 x 10 Wall push up plus  x 10 Re-assessment for insurance Prone wing taps 2 x 10 Prone reach and pull 2 x 10 Quadruped alternating arms x 20 Quadruped alternating arms and legs x 20 MELT: cervical on blue foam roller x 20, flex/ext x 20, figure 8's  12/14/23 UBE x 6 min for UE strength  3 way scapular stabilization with blue loop x 10 each side 4D ball rolls with 2 lb plyo ball x 20 each direction + circles both UE Resisted neck retraction red band hands on wall 5 sec hold x 10 Standing horizontal ABD red x 20 Standing bilateral shoulder ER with red tband x 20 Prone wing taps 2 x 10  Prone overhead reach and pull down 2 x  10 MELT technique: rotation x 20, flex/ext x 20, figure 8's 2 x 10 Prone on elbows diagonals x 10 each side Manual: STM to B UT, Levator, cervical paraspinals and suboccipitals. Sub occipital release.   12/09/23 Nustep x 6 min for UE strength  3 way scapular stabilization with blue loop x 10 each side 4D ball rolls with 2 lb plyo ball x 20 each direction + circles both UE Resisted neck retraction red band hands on wall 5 sec hold x 10 Standing horizontal ABD red x 2 Prone wing taps 2 x 10  Prone overhead  reach and pull down 2 x 10 ROM assessed Prone on elbows diagonals x 10 each side Manual: STM to B UT, Levator, cervical paraspinals and suboccipitals. Sub occipital release.   PATIENT EDUCATION:  Education details: PT eval findings, anticipated POC, initial HEP, and self-STM techniques to suboccipitals using small peanut ball   Person educated: Patient Education method: Explanation, Demonstration, Tactile cues, Verbal cues, and Handouts Education comprehension: verbalized understanding and returned demonstration  HOME EXERCISE PROGRAM: Access Code: [email or web address] URL: [email or web address] Date: 12/09/2023 Prepared by: Mliss  Exercises - Standing Isometric Cervical Sidebending with Manual Resistance  - 1 x daily - 7 x weekly - 1 sets - 5 reps - 10 sec hold hold - Standing Isometric Cervical Flexion with Manual Resistance  - 1 x daily - 7 x weekly - 1 sets - 5 reps - 10 sec hold - Standing Isometric Cervical Extension with Manual Resistance  - 1 x daily - 7 x weekly - 1 sets - 5 reps - 10 sec hold - Seated Isometric Cervical Rotation  - 1 x daily - 7 x weekly - 1 sets - 5 reps - 10 sec hold - Cervical Prone on Elbows Diagonals  - 2 x daily - 7 x weekly - 1-2 sets - 10 reps - Cervical Retraction with Resistance  - 1 x daily - 7 x weekly - 2 sets - 10 reps - 5 sec hold  MFR using small peanut on subocciptals  ASSESSMENT:  CLINICAL IMPRESSION:  We received 5 addl visits  from insurance.  She was able to do all tasks today with no increase in pain. We added isometrics for cervical stability.  She would benefit from continuing with the new authorized visits to focus on postural strengthening.     OBJECTIVE IMPAIRMENTS: decreased activity tolerance, decreased ROM, decreased strength, increased muscle spasms, impaired flexibility, and pain.   ACTIVITY LIMITATIONS: carrying, lifting, sleeping, bathing, dressing, hygiene/grooming, and caring for others  PARTICIPATION LIMITATIONS: meal prep, cleaning, laundry, driving, shopping, and yard work  PERSONAL FACTORS: Fitness, Past/current experiences, Time since onset of injury/illness/exacerbation, and 1-2 comorbidities: POTS and EDS are also affecting patient's functional outcome.   REHAB POTENTIAL: Excellent  CLINICAL DECISION MAKING: Evolving/moderate complexity  EVALUATION COMPLEXITY: Low   GOALS: Goals reviewed with patient? Yes  SHORT TERM GOALS: Target date: 12/20/2023   Patient will be independent with initial HEP.  Baseline:  Goal status: MET  2.  Decreased neck pain by 25% or more with ADLs  Baseline: 6/10 Goal status: MET 40% 12/09/23   3.  Decreased frequency and intensity of HA by 25% to improved QOL Baseline: daily Goal status: IN PROGRESS 5% 12/09/23   LONG TERM GOALS: Target date: 01/17/2024   Patient will be independent with advanced/ongoing HEP to improve outcomes and carryover.  Baseline: initial HEP Goal status: INITIAL  2.  Patient will report 75% improvement in neck pain to improve QOL.  Baseline: 6/10 Goal status: INITIAL  3.  Patient will demonstrate full pain free cervical ROM for safety with driving.  Baseline: pain in all motions but extension Goal status:INITIAL  4.  Patient will demonstrate improved mid and low trap strength to 4+/5 or better to improve posture and strain on neck.  Baseline: see chart Goal status:INITIAL  5.  Patient will report 21 or less on NDI to  demonstrate improved functional ability.  Baseline: 26 Goal status: INITIAL    PLAN:  PT FREQUENCY: 1-2x/week  PT DURATION: 8 weeks  PLANNED INTERVENTIONS: 02835- PT  Re-evaluation, 97110-Therapeutic exercises, 97530- Therapeutic activity, [id number]- Neuromuscular re-education, [phone]- Self Care, 02859- Manual therapy, [id number]- Electrical stimulation (unattended), [phone]- Traction (mechanical), [id number]- Ionotophoresis 4mg /ml Dexamethasone , 79439 (1-2 muscles), 20561 (3+ muscles)- Dry Needling, Patient/Family education, Taping, Joint mobilization, Spinal mobilization, Cryotherapy, and Moist heat  PLAN FOR NEXT SESSION: UBE, MELT, Postural strengthening, DN as last resort due to cost. Scapular stabilization, manual/DN to neck/UT, spinal stability  Delon B. Nola Botkins, PT 12/24/23 11:23 AM [facility] [address], [address] [place], [place] [address] Phone # [phone] Fax [phone]

## 2023-12-26 NOTE — Therapy (Signed)
 OUTPATIENT PHYSICAL THERAPY CERVICAL TREATMENT   Patient Name: [name] [name] MRN: [id number] DOB:01/23/1991, 33 y.o., female Today's Date: 12/27/2023  END OF SESSION:  PT End of Session - 12/27/23 0935     Visit Number 8    Number of Visits 11    Date for Recertification  01/17/24    Authorization Type [employer]    Authorization Time Period 8/18 to 10/16    Authorization - Visit Number 2    Authorization - Number of Visits 5    Progress Note Due on Visit 10    PT Start Time 0933    PT Stop Time 1015    PT Time Calculation (min) 42 min    Activity Tolerance Patient tolerated treatment well    Behavior During Therapy WFL for tasks assessed/performed             Past Medical History:  Diagnosis Date   Anxiety    Asthma    Depression    EDS (Ehlers-Danlos syndrome)    Hyperlipidemia    Migraine    Ovarian cyst    POTS (postural orthostatic tachycardia syndrome)    POTS (postural orthostatic tachycardia syndrome)    Preeclampsia, severe    Transaminitis    Vitamin B12 deficiency    Vitamin D deficiency    Past Surgical History:  Procedure Laterality Date   CESAREAN SECTION     CESAREAN SECTION WITH BILATERAL TUBAL LIGATION Bilateral 02/24/2023   Procedure: REPEAT CESAREAN SECTION WITH BILATERAL TUBAL LIGATION BY SALPINGECTOMY;  Surgeon: Gloriann Chick, MD;  Location: [facility];  Service: Obstetrics;  Laterality: Bilateral;   EYE SURGERY     x2   WISDOM TOOTH EXTRACTION     Patient Active Problem List   Diagnosis Date Noted   S/P cesarean section 02/28/2023   Acute blood loss anemia 02/28/2023   H/O anxiety disorder 02/28/2023   H/O: depression 02/28/2023   Chronic hypertension with superimposed preeclampsia 02/24/2023   POTS (postural orthostatic tachycardia syndrome) 01/28/2023   Obesity affecting pregnancy, antepartum 01/28/2023   Pregnancy with history of cesarean section, antepartum 01/28/2023   History of pre-eclampsia in prior pregnancy,  currently pregnant 01/28/2023   Primary hypertension 06/07/2020   NASH (nonalcoholic steatohepatitis) 06/26/2019   Combined hyperlipidemia 06/26/2019    PCP: Verena Mems, MD   REFERRING PROVIDER: Chrystal Lamarr RAMAN, *   REFERRING DIAG:  H55.790 (ICD-10-CM) - Tension-type headache, unspecified, not intractable  M54.2 (ICD-10-CM) - Cervicalgia    THERAPY DIAG:  Cervicalgia  Muscle weakness (generalized)  Hypermobility syndrome  Cramp and spasm  Rationale for Evaluation and Treatment: Rehabilitation  ONSET DATE: May 2025  SUBJECTIVE:  SUBJECTIVE STATEMENT: My right side is really hurting and I'm getting a migraine. It started later Friday.     Eval: Had baby at the end of November. My pelvic floor is weak and I'm getting low back pain as well. When my low back is hurting my head feels heavy and my neck feels stuck. Sometimes I do exercises the bands and weights. I've never had the sharp pains and not being able to move my neck before. This started 2-3 months ago. Low back pain is new since this latest pregnancy. I still do my neck stretches and exercises from last episode. Driving limited to 89-84 min. Migraines usually are R sided, but her last one was L sided. Hand dominance: Right  PERTINENT HISTORY:  2 c-sections (34 month old and 34 year old), POTS (hyperadrenic), peripheral neuropathy  PAIN:  12/27/23  7/10 in R side of neck Are you having pain? Yes: NPRS scale: 3 in morning up to 5-6/10 at end of day. Worse when she has a migraine of course  Pain location: B cspine Pain description: heavy and stuck Aggravating factors: unsure Relieving factors: stretching, exercises, heat/ice, picking up kids aggravates R side  PRECAUTIONS: Other: POTS - h/o of passing out  RED  FLAGS: None     WEIGHT BEARING RESTRICTIONS: No  FALLS:  Has patient fallen in last 6 months? No  LIVING ENVIRONMENT: Lives with: lives with their family   OCCUPATION: stay at home mom  PLOF: Independent  PATIENT GOALS:   NEXT MD VISIT: none scheduled  OBJECTIVE:  Note: Objective measures were completed at Evaluation unless otherwise noted.  DIAGNOSTIC FINDINGS:  none  PATIENT SURVEYS:  NDI:  NECK DISABILITY INDEX   Date: 11/22/23 Score 12/16/23  Pain intensity 2 = The pain is moderate at the moment   2. Personal care (washing, dressing, etc.) 2 = It is painful to look after myself and I am slow and careful  Completed on Ipad  3. Lifting 1 =  I can lift heavy weights but it gives extra pain   4. Reading 2 =  I can read as much as I want with moderate pain in my neck   5. Headaches 5 = I have headaches almost all the time   6. Concentration 2 = I have a fair degree of difficulty in concentrating when I want to   7. Work 3 =  I cannot do my usual work   8. Driving 3 = I can't drive my car as long as I want because of moderate pain in my neck   9. Sleeping 3 =  My sleep is moderately disturbed (2-3 hrs sleepless)   10. Recreation 3 = I am able to engage in a few of my usual recreation activities because of pain in   my neck   Total 26/50 21/50   Minimum Detectable Change (90% confidence): 5 points or 10% points  COGNITION: Overall cognitive status: Within functional limits for tasks assessed  SENSATION: WFL some N/t in the back of her legs.   POSTURE: No Significant postural limitations  PALPATION: L UT, R paraspinals, L suboccipitals    CERVICAL ROM: P! = pain  Active ROM A/PROM (deg) eval 12/09/23 12/16/23  Flexion Full P! full full  Extension full full full  Right lateral flexion WNL P! full Full P!  Left lateral flexion WNL P! Full P! full  Right rotation WNL P! full Full P!  Left rotation 75% P! full full   (Blank  rows = not tested)  UPPER EXTREMITY  ROM: WNL  UPPER EXTREMITY MMT:  MMT Right eval Left eval Right 12/16/23 Left 12/16/23  Shoulder flexion 4+ 4+P! 4+ 4+  Shoulder extension      Shoulder abduction 5 5 5 5  P!  Shoulder adduction      Shoulder extension      Shoulder internal rotation      Shoulder external rotation      Middle trapezius 5 4 5 4   Lower trapezius 4- 3+ 4 4   (Blank rows = not tested)  Rhomboids 4-4+/5   TREATMENT DATE:                                                                                                                               12/27/23 UBE x 6 min for UE strength 3 min fwd/3 bwd Seated lateral flexion 5 sec hold x 5 to left only Seated sub max isometrics 5 sec hold x 10 ea  MELT: cervical on blue foam roller x 20, flex/ext x 20, figure 8's x 10 ea side pain free ROM Prone on elbows + chin tuck - alternating fwd reach x 10 ea Prone on elbows diagonals x 10 ea  Quadruped Y and T with red band + chin tuck x 10 ea B - cues to avoid trunk rotation Quadruped thoracic rotation with red band x 10 each side  Manual: STM to B cervical spine and suboccipitals and briefly to B masseters, lateral glides to B Cspine, PROM     12/24/23 UBE x 6 min for UE strength  3 way scapular stabilization with blue loop x 10 each side 4D ball rolls with 2 lb plyo ball x 20 each direction + circles both UE Prone wing taps 2 x 10 Prone reach and pull 2 x 10 MELT: cervical on blue foam roller x 20, flex/ext x 20, figure 8's Open book x 10 each side holding 10 seconds each  Quadruped thread the needle with thoracic rotation x 10 each side holding 2-3 seconds Reviewed isometrics in all planes of cervical ROM: had patient do 10 each holding 5 sec each Instructed in MELT ball option for cervical paraspinal STM  12/16/23 UBE x 6 min for UE strength  3 way scapular stabilization with blue loop x 10 each side 4D ball rolls with 2 lb plyo ball x 20 each direction + circles both UE Lat pull with 30 # 2 x  10 Wall push up plus  x 10 Re-assessment for insurance Prone wing taps 2 x 10 Prone reach and pull 2 x 10 Quadruped alternating arms x 20 Quadruped alternating arms and legs x 20 MELT: cervical on blue foam roller x 20, flex/ext x 20, figure 8's  12/14/23 UBE x 6 min for UE strength  3 way scapular stabilization with blue loop x 10 each side 4D ball rolls with 2 lb plyo ball x 20 each direction +  circles both UE Resisted neck retraction red band hands on wall 5 sec hold x 10 Standing horizontal ABD red x 20 Standing bilateral shoulder ER with red tband x 20 Prone wing taps 2 x 10  Prone overhead reach and pull down 2 x 10 MELT technique: rotation x 20, flex/ext x 20, figure 8's 2 x 10 Prone on elbows diagonals x 10 each side Manual: STM to B UT, Levator, cervical paraspinals and suboccipitals. Sub occipital release.  PATIENT EDUCATION:  Education details: PT eval findings, anticipated POC, initial HEP, and self-STM techniques to suboccipitals using small peanut ball   Person educated: Patient Education method: Explanation, Demonstration, Tactile cues, Verbal cues, and Handouts Education comprehension: verbalized understanding and returned demonstration  HOME EXERCISE PROGRAM: Access Code: [email or web address] URL: [email or web address] Date: 12/09/2023 Prepared by: Mliss  Exercises - Standing Isometric Cervical Sidebending with Manual Resistance  - 1 x daily - 7 x weekly - 1 sets - 5 reps - 10 sec hold hold - Standing Isometric Cervical Flexion with Manual Resistance  - 1 x daily - 7 x weekly - 1 sets - 5 reps - 10 sec hold - Standing Isometric Cervical Extension with Manual Resistance  - 1 x daily - 7 x weekly - 1 sets - 5 reps - 10 sec hold - Seated Isometric Cervical Rotation  - 1 x daily - 7 x weekly - 1 sets - 5 reps - 10 sec hold - Cervical Prone on Elbows Diagonals  - 2 x daily - 7 x weekly - 1-2 sets - 10 reps - Cervical Retraction with Resistance  - 1 x daily - 7 x  weekly - 2 sets - 10 reps - 5 sec hold  MFR using small peanut on subocciptals  ASSESSMENT:  CLINICAL IMPRESSION:  Patient presents with increased R sided neck pain today since late Friday. She is unsure the cause. She reports compliance with HEP which generally helps, but has not helped this time. Reminded pt to perform sub max isometrics. She tolerated resistance to quadruped exercises without reports of increased pain. Good response to manual at end of session and mentioned she would like DN. Due to lack of time, plan to do next visit. She reports decreased pain to 5/10 at end of session.  OBJECTIVE IMPAIRMENTS: decreased activity tolerance, decreased ROM, decreased strength, increased muscle spasms, impaired flexibility, and pain.   ACTIVITY LIMITATIONS: carrying, lifting, sleeping, bathing, dressing, hygiene/grooming, and caring for others  PARTICIPATION LIMITATIONS: meal prep, cleaning, laundry, driving, shopping, and yard work  PERSONAL FACTORS: Fitness, Past/current experiences, Time since onset of injury/illness/exacerbation, and 1-2 comorbidities: POTS and EDS are also affecting patient's functional outcome.   REHAB POTENTIAL: Excellent  CLINICAL DECISION MAKING: Evolving/moderate complexity  EVALUATION COMPLEXITY: Low   GOALS: Goals reviewed with patient? Yes  SHORT TERM GOALS: Target date: 12/20/2023   Patient will be independent with initial HEP.  Baseline:  Goal status: MET  2.  Decreased neck pain by 25% or more with ADLs  Baseline: 6/10 Goal status: MET 40% 12/09/23   3.  Decreased frequency and intensity of HA by 25% to improved QOL Baseline: daily Goal status: IN PROGRESS 5% 12/09/23   LONG TERM GOALS: Target date: 01/17/2024   Patient will be independent with advanced/ongoing HEP to improve outcomes and carryover.  Baseline: initial HEP Goal status: INITIAL  2.  Patient will report 75% improvement in neck pain to improve QOL.  Baseline: 6/10 Goal  status: INITIAL  3.  Patient will demonstrate full  pain free cervical ROM for safety with driving.  Baseline: pain in all motions but extension Goal status:INITIAL  4.  Patient will demonstrate improved mid and low trap strength to 4+/5 or better to improve posture and strain on neck.  Baseline: see chart Goal status:INITIAL  5.  Patient will report 21 or less on NDI to demonstrate improved functional ability.  Baseline: 26 Goal status: INITIAL    PLAN:  PT FREQUENCY: 1-2x/week  PT DURATION: 8 weeks  PLANNED INTERVENTIONS: 97164- PT Re-evaluation, 97110-Therapeutic exercises, 97530- Therapeutic activity, 97112- Neuromuscular re-education, 97535- Self Care, 02859- Manual therapy, [id number]- Electrical stimulation (unattended), [phone]- Traction (mechanical), [id number]- Ionotophoresis 4mg /ml Dexamethasone , 79439 (1-2 muscles), 20561 (3+ muscles)- Dry Needling, Patient/Family education, Taping, Joint mobilization, Spinal mobilization, Cryotherapy, and Moist heat  PLAN FOR NEXT SESSION: DN next visit, UBE, MELT, Postural strengthening, DN as last resort due to cost. Scapular stabilization, spinal stability  Mliss Cummins, PT  12/27/23 10:27 AM [facility] [address], [address] [place], [place] [address] Phone # [phone] Fax [phone]

## 2023-12-27 ENCOUNTER — Encounter: Payer: Self-pay | Admitting: Physical Therapy

## 2023-12-27 ENCOUNTER — Ambulatory Visit: Admitting: Physical Therapy

## 2023-12-27 DIAGNOSIS — M6281 Muscle weakness (generalized): Secondary | ICD-10-CM

## 2023-12-27 DIAGNOSIS — R252 Cramp and spasm: Secondary | ICD-10-CM

## 2023-12-27 DIAGNOSIS — M357 Hypermobility syndrome: Secondary | ICD-10-CM

## 2023-12-27 DIAGNOSIS — M542 Cervicalgia: Secondary | ICD-10-CM

## 2023-12-29 ENCOUNTER — Ambulatory Visit

## 2023-12-29 DIAGNOSIS — M542 Cervicalgia: Secondary | ICD-10-CM

## 2023-12-29 DIAGNOSIS — R252 Cramp and spasm: Secondary | ICD-10-CM

## 2023-12-29 DIAGNOSIS — M6281 Muscle weakness (generalized): Secondary | ICD-10-CM

## 2023-12-29 DIAGNOSIS — M357 Hypermobility syndrome: Secondary | ICD-10-CM

## 2023-12-29 DIAGNOSIS — R519 Other chronic pain: Secondary | ICD-10-CM

## 2023-12-29 DIAGNOSIS — R293 Abnormal posture: Secondary | ICD-10-CM

## 2023-12-29 NOTE — Therapy (Signed)
 OUTPATIENT PHYSICAL THERAPY CERVICAL TREATMENT   Patient Name: [name] [name] MRN: [id number] DOB:1990-12-08, 33 y.o., female Today's Date: 12/29/2023  END OF SESSION:  PT End of Session - 12/29/23 1116     Visit Number 9    Number of Visits 11    Date for Recertification  01/17/24    Authorization Type [employer]    Authorization Time Period 8/18 to 10/16    Authorization - Visit Number 3    Progress Note Due on Visit 10    PT Start Time 1100    PT Stop Time 1145    PT Time Calculation (min) 45 min    Activity Tolerance Patient tolerated treatment well    Behavior During Therapy WFL for tasks assessed/performed             Past Medical History:  Diagnosis Date   Anxiety    Asthma    Depression    EDS (Ehlers-Danlos syndrome)    Hyperlipidemia    Migraine    Ovarian cyst    POTS (postural orthostatic tachycardia syndrome)    POTS (postural orthostatic tachycardia syndrome)    Preeclampsia, severe    Transaminitis    Vitamin B12 deficiency    Vitamin D deficiency    Past Surgical History:  Procedure Laterality Date   CESAREAN SECTION     CESAREAN SECTION WITH BILATERAL TUBAL LIGATION Bilateral 02/24/2023   Procedure: REPEAT CESAREAN SECTION WITH BILATERAL TUBAL LIGATION BY SALPINGECTOMY;  Surgeon: Gloriann Chick, MD;  Location: [facility];  Service: Obstetrics;  Laterality: Bilateral;   EYE SURGERY     x2   WISDOM TOOTH EXTRACTION     Patient Active Problem List   Diagnosis Date Noted   S/P cesarean section 02/28/2023   Acute blood loss anemia 02/28/2023   H/O anxiety disorder 02/28/2023   H/O: depression 02/28/2023   Chronic hypertension with superimposed preeclampsia 02/24/2023   POTS (postural orthostatic tachycardia syndrome) 01/28/2023   Obesity affecting pregnancy, antepartum 01/28/2023   Pregnancy with history of cesarean section, antepartum 01/28/2023   History of pre-eclampsia in prior pregnancy, currently pregnant 01/28/2023   Primary  hypertension 06/07/2020   NASH (nonalcoholic steatohepatitis) 06/26/2019   Combined hyperlipidemia 06/26/2019    PCP: Verena Mems, MD   REFERRING PROVIDER: Chrystal Lamarr RAMAN, *   REFERRING DIAG:  H55.790 (ICD-10-CM) - Tension-type headache, unspecified, not intractable  M54.2 (ICD-10-CM) - Cervicalgia    THERAPY DIAG:  Cervicalgia  Muscle weakness (generalized)  Hypermobility syndrome  Cramp and spasm  Abnormal posture  Chronic nonintractable headache, unspecified headache type  Rationale for Evaluation and Treatment: Rehabilitation  ONSET DATE: May 2025  SUBJECTIVE:  SUBJECTIVE STATEMENT: Patient reports she is doing really well today.  No headache.   Doesn't think she needs DN today.     Eval: Had baby at the end of November. My pelvic floor is weak and I'm getting low back pain as well. When my low back is hurting my head feels heavy and my neck feels stuck. Sometimes I do exercises the bands and weights. I've never had the sharp pains and not being able to move my neck before. This started 2-3 months ago. Low back pain is new since this latest pregnancy. I still do my neck stretches and exercises from last episode. Driving limited to 89-84 min. Migraines usually are R sided, but her last one was L sided. Hand dominance: Right  PERTINENT HISTORY:  2 c-sections (53 month old and 34 year old), POTS (hyperadrenic), peripheral neuropathy  PAIN:  12/27/23  7/10 in R side of neck Are you having pain? Yes: NPRS scale: 3 in morning up to 5-6/10 at end of day. Worse when she has a migraine of course  Pain location: B cspine Pain description: heavy and stuck Aggravating factors: unsure Relieving factors: stretching, exercises, heat/ice, picking up kids aggravates R  side  PRECAUTIONS: Other: POTS - h/o of passing out  RED FLAGS: None     WEIGHT BEARING RESTRICTIONS: No  FALLS:  Has patient fallen in last 6 months? No  LIVING ENVIRONMENT: Lives with: lives with their family   OCCUPATION: stay at home mom  PLOF: Independent  PATIENT GOALS:   NEXT MD VISIT: none scheduled  OBJECTIVE:  Note: Objective measures were completed at Evaluation unless otherwise noted.  DIAGNOSTIC FINDINGS:  none  PATIENT SURVEYS:  NDI:  NECK DISABILITY INDEX   Date: 11/22/23 Score 12/16/23  Pain intensity 2 = The pain is moderate at the moment   2. Personal care (washing, dressing, etc.) 2 = It is painful to look after myself and I am slow and careful  Completed on Ipad  3. Lifting 1 =  I can lift heavy weights but it gives extra pain   4. Reading 2 =  I can read as much as I want with moderate pain in my neck   5. Headaches 5 = I have headaches almost all the time   6. Concentration 2 = I have a fair degree of difficulty in concentrating when I want to   7. Work 3 =  I cannot do my usual work   8. Driving 3 = I can't drive my car as long as I want because of moderate pain in my neck   9. Sleeping 3 =  My sleep is moderately disturbed (2-3 hrs sleepless)   10. Recreation 3 = I am able to engage in a few of my usual recreation activities because of pain in   my neck   Total 26/50 21/50   Minimum Detectable Change (90% confidence): 5 points or 10% points  COGNITION: Overall cognitive status: Within functional limits for tasks assessed  SENSATION: WFL some N/t in the back of her legs.   POSTURE: No Significant postural limitations  PALPATION: L UT, R paraspinals, L suboccipitals    CERVICAL ROM: P! = pain  Active ROM A/PROM (deg) eval 12/09/23 12/16/23  Flexion Full P! full full  Extension full full full  Right lateral flexion WNL P! full Full P!  Left lateral flexion WNL P! Full P! full  Right rotation WNL P! full Full P!  Left rotation 75% P!  full full   (Blank rows = not tested)  UPPER EXTREMITY ROM: WNL  UPPER EXTREMITY MMT:  MMT Right eval Left eval Right 12/16/23 Left 12/16/23  Shoulder flexion 4+ 4+P! 4+ 4+  Shoulder extension      Shoulder abduction 5 5 5 5  P!  Shoulder adduction      Shoulder extension      Shoulder internal rotation      Shoulder external rotation      Middle trapezius 5 4 5 4   Lower trapezius 4- 3+ 4 4   (Blank rows = not tested)  Rhomboids 4-4+/5   TREATMENT DATE:                                                                                                                               12/29/23 UBE x 6 min for UE strength  3 way scapular stabilization with blue loop x 10 each side 4D ball rolls with 2 lb plyo ball x 20 each direction + circles both UE Prone wing taps 2 x 10 Prone reach and pull 2 x 10 Open book x 10 each side holding 10 seconds each  Quadruped thread the needle with thoracic rotation x 10 each side holding 2-3 seconds MELT: cervical on blue foam roller x 20, flex/ext x 20, figure 8's Thoracic extension over blue foam roller x 10 with approx 5 sec hold Lying on foam roller  tband bilateral shoulder ER 2 x 10 with red tband Lying on foam roller  tband bilateral shoulder horizontal abduction 2 x 10 with red tband Lying on foam roller  tband shoulder extension with PT anchoring 2 x 10 Lying on foam roller  tband shoulder D2 PNF x 10 each side  12/27/23 UBE x 6 min for UE strength 3 min fwd/3 bwd Seated lateral flexion 5 sec hold x 5 to left only Seated sub max isometrics 5 sec hold x 10 ea  MELT: cervical on blue foam roller x 20, flex/ext x 20, figure 8's x 10 ea side pain free ROM Prone on elbows + chin tuck - alternating fwd reach x 10 ea Prone on elbows diagonals x 10 ea  Quadruped Y and T with red band + chin tuck x 10 ea B - cues to avoid trunk rotation Quadruped thoracic rotation with red band x 10 each side  Manual: STM to B cervical spine and suboccipitals  and briefly to B masseters, lateral glides to B Cspine, PROM   12/24/23 UBE x 6 min for UE strength  3 way scapular stabilization with blue loop x 10 each side 4D ball rolls with 2 lb plyo ball x 20 each direction + circles both UE Prone wing taps 2 x 10 Prone reach and pull 2 x 10 MELT: cervical on blue foam roller x 20, flex/ext x 20, figure 8's Open book x 10 each side holding 10 seconds each  Quadruped thread the needle with thoracic  rotation x 10 each side holding 2-3 seconds Reviewed isometrics in all planes of cervical ROM: had patient do 10 each holding 5 sec each Instructed in MELT ball option for cervical paraspinal STM   PATIENT EDUCATION:  Education details: PT eval findings, anticipated POC, initial HEP, and self-STM techniques to suboccipitals using small peanut ball   Person educated: Patient Education method: Explanation, Demonstration, Tactile cues, Verbal cues, and Handouts Education comprehension: verbalized understanding and returned demonstration  HOME EXERCISE PROGRAM: Access Code: [email or web address] URL: [email or web address] Date: 12/09/2023 Prepared by: Mliss  Exercises - Standing Isometric Cervical Sidebending with Manual Resistance  - 1 x daily - 7 x weekly - 1 sets - 5 reps - 10 sec hold hold - Standing Isometric Cervical Flexion with Manual Resistance  - 1 x daily - 7 x weekly - 1 sets - 5 reps - 10 sec hold - Standing Isometric Cervical Extension with Manual Resistance  - 1 x daily - 7 x weekly - 1 sets - 5 reps - 10 sec hold - Seated Isometric Cervical Rotation  - 1 x daily - 7 x weekly - 1 sets - 5 reps - 10 sec hold - Cervical Prone on Elbows Diagonals  - 2 x daily - 7 x weekly - 1-2 sets - 10 reps - Cervical Retraction with Resistance  - 1 x daily - 7 x weekly - 2 sets - 10 reps - 5 sec hold  MFR using small peanut on subocciptals  ASSESSMENT:  CLINICAL IMPRESSION:  Ferg is progressing fairly well.  She was able to do an advanced  program today with very little fatigue.  She is compliant with her HEP. She would benefit from completing her last 2 visits of skilled PT then will likely be discharged to HEP.    OBJECTIVE IMPAIRMENTS: decreased activity tolerance, decreased ROM, decreased strength, increased muscle spasms, impaired flexibility, and pain.   ACTIVITY LIMITATIONS: carrying, lifting, sleeping, bathing, dressing, hygiene/grooming, and caring for others  PARTICIPATION LIMITATIONS: meal prep, cleaning, laundry, driving, shopping, and yard work  PERSONAL FACTORS: Fitness, Past/current experiences, Time since onset of injury/illness/exacerbation, and 1-2 comorbidities: POTS and EDS are also affecting patient's functional outcome.   REHAB POTENTIAL: Excellent  CLINICAL DECISION MAKING: Evolving/moderate complexity  EVALUATION COMPLEXITY: Low   GOALS: Goals reviewed with patient? Yes  SHORT TERM GOALS: Target date: 12/20/2023   Patient will be independent with initial HEP.  Baseline:  Goal status: MET  2.  Decreased neck pain by 25% or more with ADLs  Baseline: 6/10 Goal status: MET 40% 12/09/23   3.  Decreased frequency and intensity of HA by 25% to improved QOL Baseline: daily Goal status: IN PROGRESS 5% 12/09/23   LONG TERM GOALS: Target date: 01/17/2024   Patient will be independent with advanced/ongoing HEP to improve outcomes and carryover.  Baseline: initial HEP Goal status: INITIAL  2.  Patient will report 75% improvement in neck pain to improve QOL.  Baseline: 6/10 Goal status: INITIAL  3.  Patient will demonstrate full pain free cervical ROM for safety with driving.  Baseline: pain in all motions but extension Goal status:INITIAL  4.  Patient will demonstrate improved mid and low trap strength to 4+/5 or better to improve posture and strain on neck.  Baseline: see chart Goal status:INITIAL  5.  Patient will report 21 or less on NDI to demonstrate improved functional ability.   Baseline: 26 Goal status: INITIAL    PLAN:  PT FREQUENCY: 1-2x/week  PT DURATION: 8 weeks  PLANNED INTERVENTIONS: 97164- PT Re-evaluation, 97110-Therapeutic exercises, 97530- Therapeutic activity, [id number]- Neuromuscular re-education, [phone]- Self Care, 02859- Manual therapy, [id number]- Electrical stimulation (unattended), [phone]- Traction (mechanical), [id number]- Ionotophoresis 4mg /ml Dexamethasone , 79439 (1-2 muscles), 20561 (3+ muscles)- Dry Needling, Patient/Family education, Taping, Joint mobilization, Spinal mobilization, Cryotherapy, and Moist heat  PLAN FOR NEXT SESSION: DN next visit if indicated and patient feels she needs it, UBE, MELT, Postural strengthening, DN as last resort due to cost. Scapular stabilization, spinal stability  Gryffin Altice B. Taevion Sikora, PT 12/29/23 12:32 PM [facility] [address], [address] [place], [place] [address] Phone # [phone] Fax [phone]

## 2024-01-04 NOTE — Therapy (Signed)
 OUTPATIENT PHYSICAL THERAPY CERVICAL TREATMENT   Patient Name: [name] [name] MRN: [id number] DOB:01/31/1991, 33 y.o., female Today's Date: 01/04/2024  END OF SESSION:       Past Medical History:  Diagnosis Date   Anxiety    Asthma    Depression    EDS (Ehlers-Danlos syndrome)    Hyperlipidemia    Migraine    Ovarian cyst    POTS (postural orthostatic tachycardia syndrome)    POTS (postural orthostatic tachycardia syndrome)    Preeclampsia, severe    Transaminitis    Vitamin B12 deficiency    Vitamin D deficiency    Past Surgical History:  Procedure Laterality Date   CESAREAN SECTION     CESAREAN SECTION WITH BILATERAL TUBAL LIGATION Bilateral 02/24/2023   Procedure: REPEAT CESAREAN SECTION WITH BILATERAL TUBAL LIGATION BY SALPINGECTOMY;  Surgeon: Gloriann Chick, MD;  Location: [facility];  Service: Obstetrics;  Laterality: Bilateral;   EYE SURGERY     x2   WISDOM TOOTH EXTRACTION     Patient Active Problem List   Diagnosis Date Noted   S/P cesarean section 02/28/2023   Acute blood loss anemia 02/28/2023   H/O anxiety disorder 02/28/2023   H/O: depression 02/28/2023   Chronic hypertension with superimposed preeclampsia 02/24/2023   POTS (postural orthostatic tachycardia syndrome) 01/28/2023   Obesity affecting pregnancy, antepartum 01/28/2023   Pregnancy with history of cesarean section, antepartum 01/28/2023   History of pre-eclampsia in prior pregnancy, currently pregnant 01/28/2023   Primary hypertension 06/07/2020   NASH (nonalcoholic steatohepatitis) 06/26/2019   Combined hyperlipidemia 06/26/2019    PCP: Verena Mems, MD   REFERRING PROVIDER: Verena Mems, MD   REFERRING DIAG:  [phone] (ICD-10-CM) - Tension-type headache, unspecified, not intractable  M54.2 (ICD-10-CM) - Cervicalgia    THERAPY DIAG:  No diagnosis found.  Rationale for Evaluation and Treatment: Rehabilitation  ONSET DATE: May 2025  SUBJECTIVE:                                                                                                                                                                                                          SUBJECTIVE STATEMENT: ***  Eval: Had baby at the end of November. My pelvic floor is weak and I'm getting low back pain as well. When my low back is hurting my head feels heavy and my neck feels stuck. Sometimes I do exercises the bands and weights. I've never had the sharp pains and not being able to move my neck before. This started 2-3 months ago. Low back pain is new since this latest  pregnancy. I still do my neck stretches and exercises from last episode. Driving limited to 89-84 min. Migraines usually are R sided, but her last one was L sided. Hand dominance: Right  PERTINENT HISTORY:  2 c-sections (90 month old and 34 year old), POTS (hyperadrenic), peripheral neuropathy  PAIN:  12/27/23  7/10 in R side of neck Are you having pain? Yes: NPRS scale: 3 in morning up to 5-6/10 at end of day. Worse when she has a migraine of course  Pain location: B cspine Pain description: heavy and stuck Aggravating factors: unsure Relieving factors: stretching, exercises, heat/ice, picking up kids aggravates R side  PRECAUTIONS: Other: POTS - h/o of passing out  RED FLAGS: None     WEIGHT BEARING RESTRICTIONS: No  FALLS:  Has patient fallen in last 6 months? No  LIVING ENVIRONMENT: Lives with: lives with their family   OCCUPATION: stay at home mom  PLOF: Independent  PATIENT GOALS:   NEXT MD VISIT: none scheduled  OBJECTIVE:  Note: Objective measures were completed at Evaluation unless otherwise noted.  DIAGNOSTIC FINDINGS:  none  PATIENT SURVEYS:  NDI:  NECK DISABILITY INDEX   Date: 11/22/23 Score 12/16/23  Pain intensity 2 = The pain is moderate at the moment   2. Personal care (washing, dressing, etc.) 2 = It is painful to look after myself and I am slow and careful  Completed on Ipad  3. Lifting 1 =  I can  lift heavy weights but it gives extra pain   4. Reading 2 =  I can read as much as I want with moderate pain in my neck   5. Headaches 5 = I have headaches almost all the time   6. Concentration 2 = I have a fair degree of difficulty in concentrating when I want to   7. Work 3 =  I cannot do my usual work   8. Driving 3 = I can't drive my car as long as I want because of moderate pain in my neck   9. Sleeping 3 =  My sleep is moderately disturbed (2-3 hrs sleepless)   10. Recreation 3 = I am able to engage in a few of my usual recreation activities because of pain in   my neck   Total 26/50 21/50   Minimum Detectable Change (90% confidence): 5 points or 10% points  COGNITION: Overall cognitive status: Within functional limits for tasks assessed  SENSATION: WFL some N/t in the back of her legs.   POSTURE: No Significant postural limitations  PALPATION: L UT, R paraspinals, L suboccipitals    CERVICAL ROM: P! = pain  Active ROM A/PROM (deg) eval 12/09/23 12/16/23  Flexion Full P! full full  Extension full full full  Right lateral flexion WNL P! full Full P!  Left lateral flexion WNL P! Full P! full  Right rotation WNL P! full Full P!  Left rotation 75% P! full full   (Blank rows = not tested)  UPPER EXTREMITY ROM: WNL  UPPER EXTREMITY MMT:  MMT Right eval Left eval Right 12/16/23 Left 12/16/23  Shoulder flexion 4+ 4+P! 4+ 4+  Shoulder extension      Shoulder abduction 5 5 5 5  P!  Shoulder adduction      Shoulder extension      Shoulder internal rotation      Shoulder external rotation      Middle trapezius 5 4 5 4   Lower trapezius 4- 3+ 4 4   (Blank rows =  not tested)  Rhomboids 4-4+/5   TREATMENT DATE:                                                                                                                               01/05/24 UBE x 6 min for UE strength  3 way scapular stabilization with blue loop x 10 each side 4D ball rolls with 2 lb plyo ball x 20  each direction + circles both UE Prone wing taps 2 x 10 Prone reach and pull 2 x 10 Open book x 10 each side holding 10 seconds each  Quadruped thread the needle with thoracic rotation x 10 each side holding 2-3 seconds MELT: cervical on blue foam roller x 20, flex/ext x 20, figure 8's Thoracic extension over blue foam roller x 10 with approx 5 sec hold Lying on foam roller  tband bilateral shoulder ER 2 x 10 with red tband Lying on foam roller  tband bilateral shoulder horizontal abduction 2 x 10 with red tband Lying on foam roller  tband shoulder extension with PT anchoring 2 x 10 Lying on foam roller  tband shoulder D2 PNF x 10 each side   12/29/23 UBE x 6 min for UE strength  3 way scapular stabilization with blue loop x 10 each side 4D ball rolls with 2 lb plyo ball x 20 each direction + circles both UE Prone wing taps 2 x 10 Prone reach and pull 2 x 10 Open book x 10 each side holding 10 seconds each  Quadruped thread the needle with thoracic rotation x 10 each side holding 2-3 seconds MELT: cervical on blue foam roller x 20, flex/ext x 20, figure 8's Thoracic extension over blue foam roller x 10 with approx 5 sec hold Lying on foam roller  tband bilateral shoulder ER 2 x 10 with red tband Lying on foam roller  tband bilateral shoulder horizontal abduction 2 x 10 with red tband Lying on foam roller  tband shoulder extension with PT anchoring 2 x 10 Lying on foam roller  tband shoulder D2 PNF x 10 each side  12/27/23 UBE x 6 min for UE strength 3 min fwd/3 bwd Seated lateral flexion 5 sec hold x 5 to left only Seated sub max isometrics 5 sec hold x 10 ea  MELT: cervical on blue foam roller x 20, flex/ext x 20, figure 8's x 10 ea side pain free ROM Prone on elbows + chin tuck - alternating fwd reach x 10 ea Prone on elbows diagonals x 10 ea  Quadruped Y and T with red band + chin tuck x 10 ea B - cues to avoid trunk rotation Quadruped thoracic rotation with red band x 10 each  side  Manual: STM to B cervical spine and suboccipitals and briefly to B masseters, lateral glides to B Cspine, PROM   12/24/23 UBE x 6 min for UE strength  3 way scapular stabilization with blue loop x  10 each side 4D ball rolls with 2 lb plyo ball x 20 each direction + circles both UE Prone wing taps 2 x 10 Prone reach and pull 2 x 10 MELT: cervical on blue foam roller x 20, flex/ext x 20, figure 8's Open book x 10 each side holding 10 seconds each  Quadruped thread the needle with thoracic rotation x 10 each side holding 2-3 seconds Reviewed isometrics in all planes of cervical ROM: had patient do 10 each holding 5 sec each Instructed in MELT ball option for cervical paraspinal STM   PATIENT EDUCATION:  Education details: PT eval findings, anticipated POC, initial HEP, and self-STM techniques to suboccipitals using small peanut ball   Person educated: Patient Education method: Explanation, Demonstration, Tactile cues, Verbal cues, and Handouts Education comprehension: verbalized understanding and returned demonstration  HOME EXERCISE PROGRAM: Access Code: [email or web address] URL: [email or web address] Date: 12/09/2023 Prepared by: Mliss  Exercises - Standing Isometric Cervical Sidebending with Manual Resistance  - 1 x daily - 7 x weekly - 1 sets - 5 reps - 10 sec hold hold - Standing Isometric Cervical Flexion with Manual Resistance  - 1 x daily - 7 x weekly - 1 sets - 5 reps - 10 sec hold - Standing Isometric Cervical Extension with Manual Resistance  - 1 x daily - 7 x weekly - 1 sets - 5 reps - 10 sec hold - Seated Isometric Cervical Rotation  - 1 x daily - 7 x weekly - 1 sets - 5 reps - 10 sec hold - Cervical Prone on Elbows Diagonals  - 2 x daily - 7 x weekly - 1-2 sets - 10 reps - Cervical Retraction with Resistance  - 1 x daily - 7 x weekly - 2 sets - 10 reps - 5 sec hold  MFR using small peanut on subocciptals  ASSESSMENT:  CLINICAL IMPRESSION: ***  OBJECTIVE  IMPAIRMENTS: decreased activity tolerance, decreased ROM, decreased strength, increased muscle spasms, impaired flexibility, and pain.   ACTIVITY LIMITATIONS: carrying, lifting, sleeping, bathing, dressing, hygiene/grooming, and caring for others  PARTICIPATION LIMITATIONS: meal prep, cleaning, laundry, driving, shopping, and yard work  PERSONAL FACTORS: Fitness, Past/current experiences, Time since onset of injury/illness/exacerbation, and 1-2 comorbidities: POTS and EDS are also affecting patient's functional outcome.   REHAB POTENTIAL: Excellent  CLINICAL DECISION MAKING: Evolving/moderate complexity  EVALUATION COMPLEXITY: Low   GOALS: Goals reviewed with patient? Yes  SHORT TERM GOALS: Target date: 12/20/2023   Patient will be independent with initial HEP.  Baseline:  Goal status: MET  2.  Decreased neck pain by 25% or more with ADLs  Baseline: 6/10 Goal status: MET 40% 12/09/23   3.  Decreased frequency and intensity of HA by 25% to improved QOL Baseline: daily Goal status: IN PROGRESS 5% 12/09/23   LONG TERM GOALS: Target date: 01/17/2024   Patient will be independent with advanced/ongoing HEP to improve outcomes and carryover.  Baseline: initial HEP Goal status: INITIAL  2.  Patient will report 75% improvement in neck pain to improve QOL.  Baseline: 6/10 Goal status: INITIAL  3.  Patient will demonstrate full pain free cervical ROM for safety with driving.  Baseline: pain in all motions but extension Goal status:INITIAL  4.  Patient will demonstrate improved mid and low trap strength to 4+/5 or better to improve posture and strain on neck.  Baseline: see chart Goal status:INITIAL  5.  Patient will report 21 or less on NDI to demonstrate improved functional ability.  Baseline: 26 Goal status:  INITIAL    PLAN:  PT FREQUENCY: 1-2x/week  PT DURATION: 8 weeks  PLANNED INTERVENTIONS: 02835- PT Re-evaluation, 97110-Therapeutic exercises, 97530- Therapeutic  activity, 97112- Neuromuscular re-education, 97535- Self Care, 02859- Manual therapy, [id number]- Electrical stimulation (unattended), [phone]- Traction (mechanical), [id number]- Ionotophoresis 4mg /ml Dexamethasone , 79439 (1-2 muscles), 20561 (3+ muscles)- Dry Needling, Patient/Family education, Taping, Joint mobilization, Spinal mobilization, Cryotherapy, and Moist heat  PLAN FOR NEXT SESSION: DN next visit if indicated and patient feels she needs it, UBE, MELT, Postural strengthening, DN as last resort due to cost. Scapular stabilization, spinal stability  Mliss Cummins, PT  01/04/24 8:14 PM [facility] [address] [place], [place] [address] Phone # [phone] Fax [phone]

## 2024-01-05 ENCOUNTER — Ambulatory Visit: Attending: Family Medicine | Admitting: Physical Therapy

## 2024-01-05 ENCOUNTER — Encounter: Payer: Self-pay | Admitting: Physical Therapy

## 2024-01-05 DIAGNOSIS — M357 Hypermobility syndrome: Secondary | ICD-10-CM | POA: Insufficient documentation

## 2024-01-05 DIAGNOSIS — M6281 Muscle weakness (generalized): Secondary | ICD-10-CM | POA: Diagnosis present

## 2024-01-05 DIAGNOSIS — R252 Cramp and spasm: Secondary | ICD-10-CM | POA: Diagnosis present

## 2024-01-05 DIAGNOSIS — R293 Abnormal posture: Secondary | ICD-10-CM | POA: Insufficient documentation

## 2024-01-05 DIAGNOSIS — M542 Cervicalgia: Secondary | ICD-10-CM | POA: Diagnosis present

## 2024-01-07 ENCOUNTER — Encounter

## 2024-01-11 ENCOUNTER — Encounter

## 2024-01-13 ENCOUNTER — Encounter

## 2024-01-18 ENCOUNTER — Encounter

## 2024-01-20 ENCOUNTER — Encounter

## 2024-02-08 ENCOUNTER — Other Ambulatory Visit: Payer: Self-pay
